# Patient Record
Sex: Male | Born: 1950 | Race: White | Hispanic: No | Smoking: Current every day smoker
Health system: Southern US, Community
[De-identification: ages and names within clinical notes are randomized; demographics above are authoritative.]

## PROBLEM LIST (undated history)

## (undated) DIAGNOSIS — D649 Anemia, unspecified: Secondary | ICD-10-CM

## (undated) DIAGNOSIS — Z720 Tobacco use: Secondary | ICD-10-CM

## (undated) DIAGNOSIS — R569 Unspecified convulsions: Secondary | ICD-10-CM

## (undated) DIAGNOSIS — J42 Unspecified chronic bronchitis: Secondary | ICD-10-CM

## (undated) DIAGNOSIS — F1020 Alcohol dependence, uncomplicated: Secondary | ICD-10-CM

## (undated) DIAGNOSIS — J449 Chronic obstructive pulmonary disease, unspecified: Secondary | ICD-10-CM

## (undated) DIAGNOSIS — E559 Vitamin D deficiency, unspecified: Secondary | ICD-10-CM

## (undated) DIAGNOSIS — E291 Testicular hypofunction: Secondary | ICD-10-CM

## (undated) DIAGNOSIS — M1990 Unspecified osteoarthritis, unspecified site: Secondary | ICD-10-CM

## (undated) DIAGNOSIS — G40909 Epilepsy, unspecified, not intractable, without status epilepticus: Secondary | ICD-10-CM

## (undated) DIAGNOSIS — K7460 Unspecified cirrhosis of liver: Secondary | ICD-10-CM

## (undated) DIAGNOSIS — F1210 Cannabis abuse, uncomplicated: Secondary | ICD-10-CM

## (undated) DIAGNOSIS — F419 Anxiety disorder, unspecified: Secondary | ICD-10-CM

## (undated) DIAGNOSIS — I2089 Other forms of angina pectoris: Secondary | ICD-10-CM

## (undated) DIAGNOSIS — B182 Chronic viral hepatitis C: Secondary | ICD-10-CM

## (undated) DIAGNOSIS — I509 Heart failure, unspecified: Secondary | ICD-10-CM

## (undated) DIAGNOSIS — J189 Pneumonia, unspecified organism: Secondary | ICD-10-CM

## (undated) DIAGNOSIS — Z8719 Personal history of other diseases of the digestive system: Secondary | ICD-10-CM

## (undated) DIAGNOSIS — I208 Other forms of angina pectoris: Secondary | ICD-10-CM

## (undated) HISTORY — DX: Tobacco use: Z72.0

## (undated) HISTORY — DX: Pneumonia, unspecified organism: J18.9

## (undated) HISTORY — PX: ESOPHAGOGASTRODUODENOSCOPY: SHX1529

## (undated) HISTORY — PX: LIVER CYST REMOVAL: SHX5951

## (undated) HISTORY — DX: Heart failure, unspecified: I50.9

## (undated) HISTORY — DX: Epilepsy, unspecified, not intractable, without status epilepticus: G40.909

## (undated) HISTORY — PX: FRACTURE SURGERY: SHX138

---

## 1993-09-27 DIAGNOSIS — J189 Pneumonia, unspecified organism: Secondary | ICD-10-CM

## 1993-09-27 HISTORY — PX: LUNG SURGERY: SHX703

## 1993-09-27 HISTORY — DX: Pneumonia, unspecified organism: J18.9

## 2009-07-05 ENCOUNTER — Inpatient Hospital Stay (HOSPITAL_COMMUNITY): Admission: EM | Admit: 2009-07-05 | Discharge: 2009-07-06 | Payer: Self-pay | Admitting: Emergency Medicine

## 2009-07-05 ENCOUNTER — Ambulatory Visit: Payer: Self-pay | Admitting: Infectious Diseases

## 2009-07-05 ENCOUNTER — Ambulatory Visit: Payer: Self-pay | Admitting: Internal Medicine

## 2010-12-31 LAB — URINALYSIS, ROUTINE W REFLEX MICROSCOPIC
Bilirubin Urine: NEGATIVE
Specific Gravity, Urine: 1.015 (ref 1.005–1.030)
pH: 6.5 (ref 5.0–8.0)

## 2010-12-31 LAB — HEMOGLOBIN A1C
Hgb A1c MFr Bld: 5.2 % (ref 4.6–6.1)
Mean Plasma Glucose: 103 mg/dL

## 2010-12-31 LAB — RAPID URINE DRUG SCREEN, HOSP PERFORMED
Amphetamines: NOT DETECTED
Barbiturates: POSITIVE — AB
Cocaine: NOT DETECTED
Cocaine: NOT DETECTED
Opiates: POSITIVE — AB
Opiates: POSITIVE — AB
Tetrahydrocannabinol: POSITIVE — AB

## 2010-12-31 LAB — LIPID PANEL: VLDL: 30 mg/dL (ref 0–40)

## 2010-12-31 LAB — COMPREHENSIVE METABOLIC PANEL
AST: 86 U/L — ABNORMAL HIGH (ref 0–37)
Chloride: 104 mEq/L (ref 96–112)
Chloride: 108 mEq/L (ref 96–112)
Creatinine, Ser: 0.85 mg/dL (ref 0.4–1.5)
Glucose, Bld: 101 mg/dL — ABNORMAL HIGH (ref 70–99)
Glucose, Bld: 158 mg/dL — ABNORMAL HIGH (ref 70–99)
Potassium: 3.5 mEq/L (ref 3.5–5.1)
Sodium: 136 mEq/L (ref 135–145)
Total Protein: 6.6 g/dL (ref 6.0–8.3)

## 2010-12-31 LAB — CBC
HCT: 42.4 % (ref 39.0–52.0)
Hemoglobin: 14.8 g/dL (ref 13.0–17.0)
Hemoglobin: 17.4 g/dL — ABNORMAL HIGH (ref 13.0–17.0)
MCHC: 34.9 g/dL (ref 30.0–36.0)
MCV: 97.6 fL (ref 78.0–100.0)
Platelets: 121 10*3/uL — ABNORMAL LOW (ref 150–400)
Platelets: 142 10*3/uL — ABNORMAL LOW (ref 150–400)
RBC: 4.34 MIL/uL (ref 4.22–5.81)
RDW: 13.1 % (ref 11.5–15.5)
RDW: 13.1 % (ref 11.5–15.5)
WBC: 7.1 10*3/uL (ref 4.0–10.5)

## 2010-12-31 LAB — DIFFERENTIAL
Eosinophils Relative: 2 % (ref 0–5)
Monocytes Relative: 5 % (ref 3–12)
Neutro Abs: 3.4 10*3/uL (ref 1.7–7.7)
Neutrophils Relative %: 48 % (ref 43–77)

## 2010-12-31 LAB — HEPATITIS PANEL, ACUTE
HCV Ab: REACTIVE — AB
Hep B C IgM: NEGATIVE
Hepatitis B Surface Ag: NEGATIVE

## 2010-12-31 LAB — CARDIAC PANEL(CRET KIN+CKTOT+MB+TROPI)
Relative Index: INVALID (ref 0.0–2.5)
Troponin I: 0.02 ng/mL (ref 0.00–0.06)

## 2010-12-31 LAB — HIV ANTIBODY (ROUTINE TESTING W REFLEX)
HIV: NONREACTIVE
HIV: NONREACTIVE

## 2010-12-31 LAB — CK TOTAL AND CKMB (NOT AT ARMC)
Relative Index: 2.1 (ref 0.0–2.5)
Total CK: 101 U/L (ref 7–232)

## 2011-10-21 ENCOUNTER — Emergency Department: Payer: Self-pay | Admitting: Unknown Physician Specialty

## 2011-10-21 LAB — CBC
HGB: 17.5 g/dL (ref 13.0–18.0)
MCHC: 34.2 g/dL (ref 32.0–36.0)
RBC: 5.13 10*6/uL (ref 4.40–5.90)
WBC: 12.1 10*3/uL — ABNORMAL HIGH (ref 3.8–10.6)

## 2011-10-21 LAB — PROTIME-INR
INR: 1.1
Prothrombin Time: 15 secs — ABNORMAL HIGH (ref 11.5–14.7)

## 2011-10-21 LAB — COMPREHENSIVE METABOLIC PANEL
Albumin: 3.5 g/dL (ref 3.4–5.0)
Anion Gap: 14 (ref 7–16)
Calcium, Total: 8.7 mg/dL (ref 8.5–10.1)
Co2: 22 mmol/L (ref 21–32)
EGFR (Non-African Amer.): >60
Glucose: 103 mg/dL — ABNORMAL HIGH (ref 65–99)
Osmolality: 279 (ref 275–301)
Potassium: 4 mmol/L (ref 3.5–5.1)
SGOT(AST): 206 U/L — ABNORMAL HIGH (ref 15–37)
Sodium: 140 mmol/L (ref 136–145)

## 2011-10-21 LAB — TROPONIN I: Troponin-I: <0.02 ng/mL

## 2011-10-21 LAB — APTT: Activated PTT: 24.8 secs (ref 23.6–35.9)

## 2013-01-11 ENCOUNTER — Telehealth: Payer: Self-pay | Admitting: *Deleted

## 2013-01-11 ENCOUNTER — Emergency Department: Payer: Self-pay | Admitting: Unknown Physician Specialty

## 2013-01-11 LAB — BASIC METABOLIC PANEL
Anion Gap: 5 — ABNORMAL LOW (ref 7–16)
BUN: 12 mg/dL (ref 7–18)
Chloride: 104 mmol/L (ref 98–107)
Co2: 26 mmol/L (ref 21–32)
Creatinine: 1.04 mg/dL (ref 0.60–1.30)
EGFR (African American): >60
EGFR (Non-African Amer.): >60
Potassium: 3.6 mmol/L (ref 3.5–5.1)

## 2013-01-11 LAB — CBC
MCH: 34.1 pg — ABNORMAL HIGH (ref 26.0–34.0)
MCHC: 35.4 g/dL (ref 32.0–36.0)
MCV: 96 fL (ref 80–100)
RBC: 4.65 10*6/uL (ref 4.40–5.90)
WBC: 4.2 10*3/uL (ref 3.8–10.6)

## 2013-01-11 LAB — TROPONIN I: Troponin-I: <0.02 ng/mL

## 2013-01-11 LAB — CK TOTAL AND CKMB (NOT AT ARMC)
CK, Total: 215 U/L (ref 35–232)
CK-MB: 2.3 ng/mL (ref 0.5–3.6)

## 2013-01-11 NOTE — Telephone Encounter (Signed)
Per Dr. Mariah Milling patient needs follow-up appointment from the ER for chest pain.  Patient scheduled on 01/16/13 at 3:15pm with Dr. Kirke Corin.  Called patient and left voicemail at 3:20pm

## 2013-01-16 ENCOUNTER — Encounter: Payer: Self-pay | Admitting: Cardiovascular Disease

## 2013-01-16 ENCOUNTER — Ambulatory Visit (INDEPENDENT_AMBULATORY_CARE_PROVIDER_SITE_OTHER): Payer: Medicaid Other | Admitting: Cardiovascular Disease

## 2013-01-16 VITALS — BP 132/80 | HR 91 | Ht 69.0 in | Wt 225.8 lb

## 2013-01-16 DIAGNOSIS — F17200 Nicotine dependence, unspecified, uncomplicated: Secondary | ICD-10-CM

## 2013-01-16 DIAGNOSIS — R079 Chest pain, unspecified: Secondary | ICD-10-CM | POA: Insufficient documentation

## 2013-01-16 DIAGNOSIS — Z720 Tobacco use: Secondary | ICD-10-CM | POA: Insufficient documentation

## 2013-01-16 NOTE — Progress Notes (Signed)
HPI  This is a 62 year old male who was referred from emergency room at [facility] for evaluation of chest pain. He has no previous cardiac history. He has no history of epilepsy and has been on disability because of this. He has prolonged history of smoking. No family history of premature coronary artery disease. He has no history of hypertension or diabetes. Last week while he was sitting in the recliner, he had a sudden onset of substernal chest pain described as aching. It was associated with palpitations and dyspnea. It lasted for about 30 minutes and he thought he was having a heart attack. He went to the emergency room at [facility]. ECG  did not show any acute changes. His labs were unremarkable with negative cardiac enzymes. Chest x-ray showed no acute cardiopulmonary disease. He had another episode the day after but no chest pain since then. He reports having chest pain last year around the same time and he thinks it might be related to pollens. He has no history of allergies though. He reports cramping in both calves at night but not with physical activities  Allergies  Allergen Reactions  . Codeine     Vomiting, chills, "deathly ill"     No current outpatient prescriptions on file prior to visit.   No current facility-administered medications on file prior to visit.     Past Medical History  Diagnosis Date  . Epilepsy   . Pneumonia   . Tobacco abuse      Past Surgical History  Procedure Laterality Date  . Lung surgery  1995    [place], "walking pna"  . Liver cyst removal       History reviewed. No pertinent family history.   History   Social History  . Marital Status: Single    Spouse Name: N/A    Number of Children: N/A  . Years of Education: N/A   Occupational History  . Not on file.   Social History Main Topics  . Smoking status: Current Every Day Smoker -- 2.00 packs/day for 41 years  . Smokeless tobacco: Not on file  . Alcohol Use: Not on file  . Drug  Use: Not on file  . Sexually Active: Not on file   Other Topics Concern  . Not on file   Social History Narrative  . No narrative on file     ROS Constitutional: Negative for fever, chills, diaphoresis, activity change, appetite change and fatigue.  HENT: Negative for hearing loss, nosebleeds, congestion, sore throat, facial swelling, drooling, trouble swallowing, neck pain, voice change, sinus pressure and tinnitus.  Eyes: Negative for photophobia, pain, discharge and visual disturbance.  Respiratory: Negative for apnea, cough and wheezing.  Cardiovascular: Negative for  palpitations and leg swelling.  Gastrointestinal: Negative for nausea, vomiting, abdominal pain, diarrhea, constipation, blood in stool and abdominal distention.  Genitourinary: Negative for dysuria, urgency, frequency, hematuria and decreased urine volume.  Musculoskeletal: Negative for myalgias, back pain, joint swelling, arthralgias and gait problem.  Skin: Negative for color change, pallor, rash and wound.  Neurological: Negative for dizziness, tremors, seizures, syncope, speech difficulty, weakness, light-headedness, numbness and headaches.  Psychiatric/Behavioral: Negative for suicidal ideas, hallucinations, behavioral problems and agitation. The patient is not nervous/anxious.     PHYSICAL EXAM   BP 132/80  Pulse 91  Ht 5\' 9"  (1.753 m)  Wt 225 lb 12 oz (102.4 kg)  BMI 33.32 kg/m2 Constitutional: He is oriented to person, place, and time. He appears well-developed and well-nourished. No  distress.  HENT: No nasal discharge.  Head: Normocephalic and atraumatic.  Eyes: Pupils are equal and round. Right eye exhibits no discharge. Left eye exhibits no discharge.  Neck: Normal range of motion. Neck supple. No JVD present. No thyromegaly present.  Cardiovascular: Normal rate, regular rhythm, normal heart sounds and. Exam reveals no gallop and no friction rub. No murmur heard.  Pulmonary/Chest: Effort normal  and breath sounds normal. No stridor. No respiratory distress. He has no wheezes. He has no rales. He exhibits no tenderness.  Abdominal: Soft. Bowel sounds are normal. He exhibits no distension. There is no tenderness. There is no rebound and no guarding.  Musculoskeletal: Normal range of motion. He exhibits no edema and no tenderness.  Neurological: He is alert and oriented to person, place, and time. Coordination normal.  Skin: Skin is warm and dry. No rash noted. He is not diaphoretic. No erythema. No pallor.  Psychiatric: He has a normal mood and affect. His behavior is normal. Judgment and thought content normal.  Distal pulses are normal     EKG: Normal sinus rhythm no significant ST changes.   ASSESSMENT AND PLAN

## 2013-01-16 NOTE — Assessment & Plan Note (Signed)
The patient had 2 episodes of chest pain last week but since then has been feeling better. Baseline EKG does not show any acute changes and physical exam is unremarkable. This was associated with dyspnea or palpitations. I recommend further evaluation with a stress echocardiogram.

## 2013-01-16 NOTE — Assessment & Plan Note (Signed)
I discussed with the patient the importance of smoking cessation and healthy lifestyle in order to decrease his chances of future cardiovascular events.

## 2013-01-16 NOTE — Patient Instructions (Addendum)
Your physician has requested that you have a stress echocardiogram. For further information please visit [email or web address] Please follow instruction sheet as given.  Follow up as needed.

## 2013-02-07 ENCOUNTER — Other Ambulatory Visit: Payer: Self-pay

## 2013-02-07 DIAGNOSIS — R079 Chest pain, unspecified: Secondary | ICD-10-CM

## 2013-02-07 NOTE — Addendum Note (Signed)
Addended by: Basma Buchner E on: 02/07/2013 10:09 AM   Modules accepted: Orders

## 2013-02-15 ENCOUNTER — Encounter: Payer: Self-pay | Admitting: Cardiovascular Disease

## 2013-02-15 ENCOUNTER — Ambulatory Visit (INDEPENDENT_AMBULATORY_CARE_PROVIDER_SITE_OTHER): Payer: Medicaid Other | Admitting: Cardiovascular Disease

## 2013-02-15 ENCOUNTER — Other Ambulatory Visit (INDEPENDENT_AMBULATORY_CARE_PROVIDER_SITE_OTHER): Payer: Medicaid Other

## 2013-02-15 VITALS — Ht 69.0 in | Wt 225.0 lb

## 2013-02-15 DIAGNOSIS — R079 Chest pain, unspecified: Secondary | ICD-10-CM

## 2013-02-15 NOTE — Progress Notes (Signed)
Exercise Treadmill Test   Treadmill ordered for recent epsiodes of chest pain.  Resting EKG shows NSR with rate of 80 bpm, with no significant ST or T wave changes Resting blood pressure of 124/82. Stand bruce protocal was used.  Patient exercised for 6 min 24 sec,  Peak heart rate of 126 bpm.  This was 78% of the maximum predicted heart rate (target heart rate 136). No symptoms of chest pain or lightheadedness were reported at peak stress or in recovery.  Legs gave out and was unable to reach target heart rate. Peak Blood pressure recorded was 160/96. Heart rate at 3 minutes in recovery was 93 bpm. No significant ST changes at peak stress or in recovery concerning for ischemia  FINAL IMPRESSION: Suboptimal  exercise stress echo test. No significant EKG changes concerning for ischemia. Adequate exercise tolerance. Unable to reach target heart rate.  Stress ECHO images did not show any significant wall motion abnormality concerning for ischemia. Clinical correlation recommended.  If indicated, consider alternate testing modality such as pharmacologic Myoview.

## 2013-02-16 ENCOUNTER — Telehealth: Payer: Self-pay

## 2013-02-16 NOTE — Telephone Encounter (Signed)
78% is ok with me. I don't see the results though.

## 2013-02-16 NOTE — Telephone Encounter (Signed)
Dr. Mariah Milling wanted to let us know he read Stress echo done on pt yesterday and pt only reached 78% of target HR. Asks if Dr. Kirke Corin wanted to order a different test

## 2013-02-16 NOTE — Telephone Encounter (Signed)
look under 5/22 encounter note

## 2013-02-16 NOTE — Telephone Encounter (Signed)
Inform patient that his stress test was normal but we did not reach target heart rate. If he is still having chest pain, I suggest a Lexiscan myoview.

## 2013-02-20 NOTE — Telephone Encounter (Signed)
Pt informed He verb understanding but denies further CP, attributes the one episode to the pollen He will call us back should the pain return

## 2013-03-15 ENCOUNTER — Encounter: Payer: Self-pay | Admitting: Cardiovascular Disease

## 2013-03-22 NOTE — Patient Instructions (Addendum)
Normal stress echo today No further testing needed Please call us if you have any further questions or symptoms

## 2013-10-19 ENCOUNTER — Ambulatory Visit: Payer: Self-pay | Admitting: Gastroenterology

## 2016-04-06 ENCOUNTER — Emergency Department
Admission: EM | Admit: 2016-04-06 | Discharge: 2016-04-06 | Disposition: A | Discharge status: Home / Self Care | Payer: Medicaid Other | Attending: Emergency Medicine | Admitting: Emergency Medicine

## 2016-04-06 ENCOUNTER — Encounter: Payer: Self-pay | Admitting: Emergency Medicine

## 2016-04-06 DIAGNOSIS — F17200 Nicotine dependence, unspecified, uncomplicated: Secondary | ICD-10-CM | POA: Diagnosis not present

## 2016-04-06 DIAGNOSIS — Z760 Encounter for issue of repeat prescription: Secondary | ICD-10-CM | POA: Diagnosis present

## 2016-04-06 DIAGNOSIS — Z7982 Long term (current) use of aspirin: Secondary | ICD-10-CM | POA: Insufficient documentation

## 2016-04-06 DIAGNOSIS — Z8669 Personal history of other diseases of the nervous system and sense organs: Secondary | ICD-10-CM | POA: Insufficient documentation

## 2016-04-06 MED ORDER — PHENOBARBITAL 97.2 MG PO TABS: 97.2000 mg | ORAL_TABLET | Freq: Every day | ORAL | Status: DC

## 2016-04-06 MED ORDER — ALPRAZOLAM 0.5 MG PO TABS: 0.5000 mg | ORAL_TABLET | Freq: Two times a day (BID) | ORAL | Status: AC | PRN

## 2016-04-06 NOTE — ED Notes (Signed)
Patient states he is out of his seizure medication, does not see new PCP in enough time before he runs out.

## 2016-04-06 NOTE — ED Provider Notes (Signed)
[facility] Emergency Department Provider Note   ____________________________________________  Time seen: Approximately 12:14 PM  I have reviewed the triage vital signs and the nursing notes.   HISTORY  Chief Complaint Medication Refill    HPI Glen Colon is a 65 y.o. male patient requests refill of  seizure and anxiety medications. She stated previous PCP has died his new PCP will not see him until mid August. Patient stated he had a seizure activity secondary to being out of medications last night.   Past Medical History  Diagnosis Date  . Epilepsy ([place])   . Pneumonia   . Tobacco abuse     Patient Active Problem List   Diagnosis Date Noted  . Chest pain 01/16/2013  . Tobacco abuse     Past Surgical History  Procedure Laterality Date  . Lung surgery  [place], "walking pna"  . Liver cyst removal      Current Outpatient Rx  Name  Route  Sig  Dispense  Refill  . ALPRAZolam (XANAX) 0.5 MG tablet   Oral   Take 1 tablet (0.5 mg total) by mouth 2 (two) times daily as needed for anxiety or sleep.   30 tablet   0   . alprazolam (XANAX) 2 MG tablet   Oral   Take 1 mg by mouth 2 (two) times daily.         Marland Kitchen aspirin 81 MG tablet   Oral   Take 81 mg by mouth as needed for pain.         Marland Kitchen PHENObarbital (LUMINAL) 100 MG tablet   Oral   Take 100 mg by mouth daily.         Marland Kitchen PHENobarbital (LUMINAL) 97.2 MG tablet   Oral   Take 1 tablet (97.2 mg total) by mouth at bedtime.   30 tablet   0     Allergies Codeine  No family history on file.  Social History Social History  Substance Use Topics  . Smoking status: Current Every Day Smoker -- 2.00 packs/day for 41 years  . Smokeless tobacco: None  . Alcohol Use: None    Review of Systems Constitutional: No fever/chills Eyes: No visual changes. ENT: No sore throat. Cardiovascular: Denies chest pain. Respiratory: Denies shortness of breath. Gastrointestinal:  No abdominal pain.  No nausea, no vomiting.  No diarrhea.  No constipation. Genitourinary: Negative for dysuria. Musculoskeletal: Negative for back pain. Skin: Negative for rash. Neurological: Negative for headaches, focal weakness or numbness.Seizures Allergic/Immunilogical: Codeine  ____________________________________________   PHYSICAL EXAM:  VITAL SIGNS: ED Triage Vitals  Enc Vitals Group     BP 04/06/16 1157 139/73 mmHg     Pulse Rate 04/06/16 1157 80     Resp 04/06/16 1157 18     Temp 04/06/16 1157 97.6 F (36.4 C)     Temp Source 04/06/16 1157 Oral     SpO2 04/06/16 1157 99 %     Weight --      Height --      Head Cir --      Peak Flow --      Pain Score --      Pain Loc --      Pain Edu? --      Excl. in [place]? --     Constitutional: Alert and oriented. Well appearing and in no acute distress. Eyes: Conjunctivae are normal. PERRL. EOMI. Head: Atraumatic. Nose: No congestion/rhinnorhea. Mouth/Throat: Mucous membranes are  moist.  Oropharynx non-erythematous. Neck: No stridor.  No cervical spine tenderness to palpation. Hematological/Lymphatic/Immunilogical: No cervical lymphadenopathy. Cardiovascular: Normal rate, regular rhythm. Grossly normal heart sounds.  Good peripheral circulation. Respiratory: Normal respiratory effort.  No retractions. Lungs CTAB. Gastrointestinal: Soft and nontender. No distention. No abdominal bruits. No CVA tenderness. Musculoskeletal: No lower extremity tenderness nor edema.  No joint effusions. Neurologic:  Normal speech and language. No gross focal neurologic deficits are appreciated. No gait instability. Skin:  Skin is warm, dry and intact. No rash noted. Psychiatric: Mood and affect are normal. Speech and behavior are normal.  ____________________________________________   LABS (all labs ordered are listed, but only abnormal results are displayed)  Labs Reviewed - No data to  display ____________________________________________  EKG   ____________________________________________  RADIOLOGY   ____________________________________________   PROCEDURES  Procedure(s) performed: None  Procedures  Critical Care performed: No  ____________________________________________   INITIAL IMPRESSION / ASSESSMENT AND PLAN / ED COURSE  Pertinent labs & imaging results that were available during my care of the patient were reviewed by me and considered in my medical decision making (see chart for details).  Medication refill for seizure. Patient phenobarbital and Xanax was refilled. Patient advised to follow new PCP per scheduled for continual care. ____________________________________________   FINAL CLINICAL IMPRESSION(S) / ED DIAGNOSES  Final diagnoses:  Medication refill      NEW MEDICATIONS STARTED DURING THIS VISIT:  New Prescriptions   ALPRAZOLAM (XANAX) 0.5 MG TABLET    Take 1 tablet (0.5 mg total) by mouth 2 (two) times daily as needed for anxiety or sleep.   PHENOBARBITAL (LUMINAL) 97.2 MG TABLET    Take 1 tablet (97.2 mg total) by mouth at bedtime.     Note:  This document was prepared using Dragon voice recognition software and may include unintentional dictation errors.    Sable Feil, PA-C 04/06/16 [place], MD 04/06/16 [phone]

## 2016-04-06 NOTE — ED Notes (Signed)
Pt states he needs a refill on his seizure meds has been out for three days and had a seizure last night. Pt does not want to be seen for seizure, just needs a med refill.

## 2016-04-06 NOTE — Discharge Instructions (Signed)
Medicine Refill at the Emergency Department  We have refilled your medicine today, but it is best for you to get refills through your primary health care provider's office. In the future, please plan ahead so you do not need to get refills from the emergency department.  If the medicine we refilled was a maintenance medicine, you may have received only enough to get you by until you are able to see your regular health care provider.     This information is not intended to replace advice given to you by your health care provider. Make sure you discuss any questions you have with your health care provider.     Document Released: 12/31/2003 Document Revised: 10/04/2014 Document Reviewed: 12/21/2013  Elsevier Interactive Patient Education 2016 [employer]

## 2016-05-13 ENCOUNTER — Emergency Department
Admission: EM | Admit: 2016-05-13 | Discharge: 2016-05-13 | Disposition: A | Discharge status: Home / Self Care | Payer: Medicaid Other | Attending: Emergency Medicine | Admitting: Emergency Medicine

## 2016-05-13 ENCOUNTER — Encounter: Payer: Self-pay | Admitting: Emergency Medicine

## 2016-05-13 DIAGNOSIS — Z7982 Long term (current) use of aspirin: Secondary | ICD-10-CM | POA: Diagnosis not present

## 2016-05-13 DIAGNOSIS — F17200 Nicotine dependence, unspecified, uncomplicated: Secondary | ICD-10-CM | POA: Insufficient documentation

## 2016-05-13 DIAGNOSIS — H9202 Otalgia, left ear: Secondary | ICD-10-CM | POA: Diagnosis present

## 2016-05-13 MED ORDER — TRAMADOL HCL 50 MG PO TABS: 50.0000 mg | ORAL_TABLET | Freq: Four times a day (QID) | ORAL | 0 refills | Status: DC | PRN

## 2016-05-13 MED ORDER — AMOXICILLIN 500 MG PO CAPS
500.0000 mg | ORAL_CAPSULE | Freq: Three times a day (TID) | ORAL | 0 refills | Status: DC
Start: 2016-05-13 — End: 2016-11-09

## 2016-05-13 NOTE — ED Triage Notes (Signed)
Left ear pain for about 1 week  Was seen by PCP on Monday and received some ear drops  W/o relief

## 2016-05-13 NOTE — ED Provider Notes (Signed)
[facility] Emergency Department Provider Note   ____________________________________________    I have reviewed the triage vital signs and the nursing notes.   HISTORY  Chief Complaint Otalgia     HPI Glen Colon is a 65 y.o. male who presents with left-sided otalgia. He has been treated by his PCP with ear drops which he reports helped temporarily but now the pain has worsened again.No fevers or chills reported. No other URI symptoms. He reports the pain is moderate to severe and keeps him  awake at night.   Past Medical History:  Diagnosis Date  . Epilepsy ([place])   . Pneumonia   . Tobacco abuse     Patient Active Problem List   Diagnosis Date Noted  . Chest pain 01/16/2013  . Tobacco abuse     Past Surgical History:  Procedure Laterality Date  . LIVER CYST REMOVAL    . [place], "walking pna"    Prior to Admission medications   Medication Sig Start Date End Date Taking? Authorizing Provider  ALPRAZolam Duanne Moron) 0.5 MG tablet Take 1 tablet (0.5 mg total) by mouth 2 (two) times daily as needed for anxiety or sleep. 04/06/16 04/06/17  Sable Feil, PA-C  alprazolam Duanne Moron) 2 MG tablet Take 1 mg by mouth 2 (two) times daily.    Historical Provider, MD  amoxicillin (AMOXIL) 500 MG capsule Take 1 capsule (500 mg total) by mouth 3 (three) times daily. 05/13/16   Lavonia Drafts, MD  aspirin 81 MG tablet Take 81 mg by mouth as needed for pain.    Historical Provider, MD  PHENObarbital (LUMINAL) 100 MG tablet Take 100 mg by mouth daily.    Historical Provider, MD  PHENobarbital (LUMINAL) 97.2 MG tablet Take 1 tablet (97.2 mg total) by mouth at bedtime. 04/06/16   Sable Feil, PA-C  traMADol (ULTRAM) 50 MG tablet Take 1 tablet (50 mg total) by mouth every 6 (six) hours as needed. 05/13/16 05/13/17  Lavonia Drafts, MD     Allergies Codeine  No family history on file.  Social History Social History  Substance Use  Topics  . Smoking status: Current Every Day Smoker    Packs/day: 2.00    Years: 41.00  . Smokeless tobacco: Never Used  . Alcohol use Yes    Review of Systems  Constitutional: No fever/chills  ENT: No sore throat.   Gastrointestinal:  No nausea, no vomiting.    Skin: Negative for rash. Neurological: Negative for headaches     ____________________________________________   PHYSICAL EXAM:  VITAL SIGNS: ED Triage Vitals  Enc Vitals Group     BP 05/13/16 1514 (!) 160/97     Pulse Rate 05/13/16 1514 86     Resp 05/13/16 1514 20     Temp 05/13/16 1514 98.7 F (37.1 C)     Temp Source 05/13/16 1514 Oral     SpO2 05/13/16 1514 97 %     Weight 05/13/16 1515 220 lb (99.8 kg)     Height 05/13/16 1515 5\' 10"  (1.778 m)     Head Circumference --      Peak Flow --      Pain Score 05/13/16 1515 9     Pain Loc --      Pain Edu? --      Excl. in [place]? --     Constitutional: Alert and oriented. No acute distress. Pleasant and interactive Eyes: Conjunctivae are normal.  Head: Atraumatic. Nose: No congestion/rhinnorhea. Ears/Mouth/Throat: Mucous membranes are moist. Left ear with erythema of the external acoustic meatus but also with significant bulging of the left eardrum and whitish discoloration Cardiovascular: Normal rate, regular rhythm.  Respiratory: Normal respiratory effort.  No retractions. Genitourinary: deferred Musculoskeletal: No lower extremity tenderness nor edema.   Neurologic:  Normal speech and language. No gross focal neurologic deficits are appreciated.   Skin:  Skin is warm, dry and intact. No rash noted.   ____________________________________________   LABS (all labs ordered are listed, but only abnormal results are displayed)  Labs Reviewed - No data to  display ____________________________________________  EKG   ____________________________________________  RADIOLOGY  None ____________________________________________   PROCEDURES  Procedure(s) performed: No    Critical Care performed: No ____________________________________________   INITIAL IMPRESSION / ASSESSMENT AND PLAN / ED COURSE  Pertinent labs & imaging results that were available during my care of the patient were reviewed by me and considered in my medical decision making (see chart for details).  Exam appears consistent with otitis externa but given his lack of response we will start him on by mouth antibiotics and have him follow-up closely with his PCP.   ____________________________________________   FINAL CLINICAL IMPRESSION(S) / ED DIAGNOSES  Final diagnoses:  Otalgia of left ear      NEW MEDICATIONS STARTED DURING THIS VISIT:  Discharge Medication List as of 05/13/2016  3:30 PM    START taking these medications   Details  amoxicillin (AMOXIL) 500 MG capsule Take 1 capsule (500 mg total) by mouth 3 (three) times daily., Starting Thu 05/13/2016, Print    traMADol (ULTRAM) 50 MG tablet Take 1 tablet (50 mg total) by mouth every 6 (six) hours as needed., Starting Thu 05/13/2016, Until Fri 05/13/2017, Print         Note:  This document was prepared using Dragon voice recognition software and may include unintentional dictation errors.    Lavonia Drafts, MD 05/13/16 [phone]

## 2016-09-27 DIAGNOSIS — R569 Unspecified convulsions: Secondary | ICD-10-CM

## 2016-09-27 DIAGNOSIS — G40909 Epilepsy, unspecified, not intractable, without status epilepticus: Secondary | ICD-10-CM

## 2016-09-27 HISTORY — DX: Epilepsy, unspecified, not intractable, without status epilepticus: G40.909

## 2016-09-27 HISTORY — DX: Unspecified convulsions: R56.9

## 2016-10-28 ENCOUNTER — Other Ambulatory Visit: Payer: Self-pay | Admitting: Nurse Practitioner

## 2016-10-28 DIAGNOSIS — Z77018 Contact with and (suspected) exposure to other hazardous metals: Secondary | ICD-10-CM

## 2016-10-28 DIAGNOSIS — K7689 Other specified diseases of liver: Secondary | ICD-10-CM

## 2016-10-28 DIAGNOSIS — K7469 Other cirrhosis of liver: Secondary | ICD-10-CM

## 2016-11-08 ENCOUNTER — Ambulatory Visit
Admission: RE | Admit: 2016-11-08 | Discharge: 2016-11-08 | Disposition: A | Discharge status: Home / Self Care | Payer: Medicaid Other | Source: Ambulatory Visit | Attending: Nurse Practitioner | Admitting: Nurse Practitioner

## 2016-11-08 DIAGNOSIS — Z77018 Contact with and (suspected) exposure to other hazardous metals: Secondary | ICD-10-CM

## 2016-11-08 DIAGNOSIS — K7689 Other specified diseases of liver: Secondary | ICD-10-CM

## 2016-11-08 DIAGNOSIS — K7469 Other cirrhosis of liver: Secondary | ICD-10-CM

## 2016-11-08 MED ORDER — GADOXETATE DISODIUM 0.25 MMOL/ML IV SOLN
9.0000 mL | Freq: Once | INTRAVENOUS | Status: AC | PRN
  Administered 2016-11-08: 9 mL via INTRAVENOUS

## 2016-11-08 MED ORDER — GADOBENATE DIMEGLUMINE 529 MG/ML IV SOLN: 9.0000 mL | Freq: Once | INTRAVENOUS | Status: DC | PRN

## 2016-11-09 ENCOUNTER — Emergency Department
Admission: EM | Admit: 2016-11-09 | Discharge: 2016-11-09 | Disposition: A | Discharge status: Home / Self Care | Payer: Medicaid Other | Attending: Emergency Medicine | Admitting: Emergency Medicine

## 2016-11-09 ENCOUNTER — Encounter: Payer: Self-pay | Admitting: Emergency Medicine

## 2016-11-09 DIAGNOSIS — H6992 Unspecified Eustachian tube disorder, left ear: Secondary | ICD-10-CM | POA: Diagnosis not present

## 2016-11-09 DIAGNOSIS — H6122 Impacted cerumen, left ear: Secondary | ICD-10-CM | POA: Diagnosis not present

## 2016-11-09 DIAGNOSIS — Z7982 Long term (current) use of aspirin: Secondary | ICD-10-CM | POA: Insufficient documentation

## 2016-11-09 DIAGNOSIS — F17210 Nicotine dependence, cigarettes, uncomplicated: Secondary | ICD-10-CM | POA: Insufficient documentation

## 2016-11-09 DIAGNOSIS — H9202 Otalgia, left ear: Secondary | ICD-10-CM

## 2016-11-09 DIAGNOSIS — H6982 Other specified disorders of Eustachian tube, left ear: Secondary | ICD-10-CM

## 2016-11-09 MED ORDER — AMOXICILLIN 500 MG PO TABS: 500.0000 mg | ORAL_TABLET | Freq: Three times a day (TID) | ORAL | 0 refills | Status: DC

## 2016-11-09 MED ORDER — FLUTICASONE PROPIONATE 50 MCG/ACT NA SUSP: 2.0000 | Freq: Every day | NASAL | 0 refills | Status: DC

## 2016-11-09 NOTE — ED Triage Notes (Signed)
Left ear pain x 3-4 days.  Patient states he had similar symptoms last year and was treated with antibiotics for ear infection.

## 2016-11-09 NOTE — ED Notes (Signed)
See triage note   Left ear pain for about 3-4 days  Afebrile   No drainage

## 2016-11-09 NOTE — ED Provider Notes (Signed)
[facility] Emergency Department Provider Note  ____________________________________________  Time seen: Approximately 2:37 PM  I have reviewed the triage vital signs and the nursing notes.   HISTORY  Chief Complaint Otalgia    HPI Glen Colon is a 66 y.o. male , NAD, presents to the emergency department for evaluation of left ear pain. States he had onset of left ear pain approximately 4 days ago. Felt he might have had a fever but did not check his temperature. Denies any nasal congestion, runny nose, sore throat, sinus pressure. Has noted that he feels lightheaded when he goes from sitting to standing or from laying to sitting. Can also had some lightheadedness when he moves his head too fast. Denies any drainage from the ears. Has been using sweet oil in the left ear in which he notes dark discharge on his pillow when he wakes in the morning. Denies injury or trauma to the face or neck. Has had no changes in hearing. States he had a similar episode some months ago was placed on antibiotics which alleviated the symptoms.   Past Medical History:  Diagnosis Date  . Epilepsy ([place])   . Pneumonia   . Tobacco abuse     Patient Active Problem List   Diagnosis Date Noted  . Chest pain 01/16/2013  . Tobacco abuse     Past Surgical History:  Procedure Laterality Date  . LIVER CYST REMOVAL    . [place], "walking pna"    Prior to Admission medications   Medication Sig Start Date End Date Taking? Authorizing Provider  ALPRAZolam Duanne Moron) 0.5 MG tablet Take 1 tablet (0.5 mg total) by mouth 2 (two) times daily as needed for anxiety or sleep. 04/06/16 04/06/17  Sable Feil, PA-C  alprazolam Duanne Moron) 2 MG tablet Take 1 mg by mouth 2 (two) times daily.    Historical Provider, MD  amoxicillin (AMOXIL) 500 MG tablet Take 1 tablet (500 mg total) by mouth 3 (three) times daily with meals. 11/09/16   Stepehn Eckard L Krayton Wortley, PA-C  aspirin 81 MG tablet  Take 81 mg by mouth as needed for pain.    Historical Provider, MD  fluticasone (FLONASE) 50 MCG/ACT nasal spray Place 2 sprays into both nostrils daily. 11/09/16   Eashan Schipani L Rockie Schnoor, PA-C  PHENObarbital (LUMINAL) 100 MG tablet Take 100 mg by mouth daily.    Historical Provider, MD  PHENobarbital (LUMINAL) 97.2 MG tablet Take 1 tablet (97.2 mg total) by mouth at bedtime. 04/06/16   Sable Feil, PA-C    Allergies Codeine  No family history on file.  Social History Social History  Substance Use Topics  . Smoking status: Current Every Day Smoker    Packs/day: 2.00    Years: 41.00    Types: Cigarettes  . Smokeless tobacco: Never Used  . Alcohol use Yes     Review of Systems  Constitutional: No fever/chills Eyes: No visual changes. ENT: Positive left ear pain without drainage. No nasal congestion, runny nose, sinus pressure, tinnitus or changes in hearing Cardiovascular: No chest pain. Respiratory:  No shortness of breath.  Musculoskeletal: Negative for neck pain or general myalgias.  Skin: Negative for rash. Neurological: Positive lightheadedness. Negative for headaches, focal weakness or numbness. No tingling. No dizziness. 10-point ROS otherwise negative.  ____________________________________________   PHYSICAL EXAM:  VITAL SIGNS: ED Triage Vitals  Enc Vitals Group     BP 11/09/16 1327 135/76     Pulse Rate  11/09/16 1327 79     Resp 11/09/16 1327 16     Temp 11/09/16 1327 98.2 F (36.8 C)     Temp Source 11/09/16 1327 Oral     SpO2 11/09/16 1327 96 %     Weight 11/09/16 1322 212 lb (96.2 kg)     Height 11/09/16 1322 5\' 10"  (1.778 m)     Head Circumference --      Peak Flow --      Pain Score 11/09/16 1323 5     Pain Loc --      Pain Edu? --      Excl. in [place]? --      Constitutional: Alert and oriented. Well appearing and in no acute distress. Eyes: Conjunctivae are normalWithout icterus, injection or discharge. No nystagmus.  Head: Atraumatic. ENT:      Ears:  Right TM visualized without erythema, bulging, effusion, perforation. Left TM could not be visualized due to impacted, dark brown, hardened cerumen noted in the posterior ear canal. Attempts by nursing to dislodge the cerumen was unsuccessful.      Nose: No congestion/rhinnorhea.      Mouth/Throat: Mucous membranes are moist. Pharynx without erythema, swelling, exudate. Uvula is midline. Airway is patent. Neck: No stridor. No carotid bruits. Supple with full range of motion. Hematological/Lymphatic/Immunilogical: No cervical lymphadenopathy. Cardiovascular: Normal rate, regular rhythm. Normal S1 and S2.  Good peripheral circulation. Respiratory: Normal respiratory effort without tachypnea or retractions. Lungs CTAB with breath sounds noted in all lung fields. No wheeze, rhonchi, rales Neurologic:  Normal speech and language. No gross focal neurologic deficits are appreciated.  Skin:  Skin is warm, dry and intact. No rash noted. Psychiatric: Mood and affect are normal. Speech and behavior are normal. Patient exhibits appropriate insight and judgement.   ____________________________________________   LABS  None ____________________________________________  EKG  None ____________________________________________  RADIOLOGY I, [place], personally viewed and evaluated these images (plain radiographs) as part of my medical decision making, as well as reviewing the written report by the radiologist.  No results found.  ____________________________________________    PROCEDURES  Procedure(s) performed: None   Procedures   Medications - No data to display   ____________________________________________   INITIAL IMPRESSION / ASSESSMENT AND PLAN / ED COURSE  Pertinent labs & imaging results that were available during my care of the patient were reviewed by me and considered in my medical decision making (see chart for details).     Patient's diagnosis is consistent  with left ear pain, impacted cerumen of the left ear and eustachian tube dysfunction of the left ear. Patient will be discharged home with prescriptions for amoxicillin and Flonase to take as directed. May take over-the-counter Tylenol or ibuprofen as needed for pain. Advise patient to utilize over-the-counter ear wax softening drops to alleviate cerumen impaction. Patient is to follow up with his primary care provider if symptoms persist past this treatment course. Patient is given ED precautions to return to the ED for any worsening or new symptoms.   ____________________________________________  FINAL CLINICAL IMPRESSION(S) / ED DIAGNOSES  Final diagnoses:  Left ear pain  Impacted cerumen of left ear  Dysfunction of left eustachian tube      NEW MEDICATIONS STARTED DURING THIS VISIT:  Discharge Medication List as of 11/09/2016  3:12 PM    START taking these medications   Details  amoxicillin (AMOXIL) 500 MG tablet Take 1 tablet (500 mg total) by mouth 3 (three) times daily with meals., Starting Tue  11/09/2016, Print    fluticasone (FLONASE) 50 MCG/ACT nasal spray Place 2 sprays into both nostrils daily., Starting Tue 11/09/2016, [place], PA-C 11/10/16 [place], MD 11/10/16 [phone]

## 2016-11-09 NOTE — ED Notes (Signed)
Left ear irrigated with 30 mls of water   W/o success

## 2016-11-22 ENCOUNTER — Other Ambulatory Visit: Payer: Self-pay | Admitting: Adult Health

## 2016-11-22 DIAGNOSIS — R918 Other nonspecific abnormal finding of lung field: Secondary | ICD-10-CM

## 2017-01-06 ENCOUNTER — Encounter: Payer: Self-pay | Admitting: Nurse Practitioner

## 2017-01-19 ENCOUNTER — Encounter: Payer: Self-pay | Admitting: Nurse Practitioner

## 2017-10-11 ENCOUNTER — Inpatient Hospital Stay: Payer: Medicaid Other

## 2017-10-11 ENCOUNTER — Emergency Department: Payer: Medicaid Other

## 2017-10-11 ENCOUNTER — Other Ambulatory Visit: Payer: Self-pay

## 2017-10-11 ENCOUNTER — Encounter
Admission: EM | Disposition: A | Discharge status: Home / Self Care | Payer: Self-pay | Source: Home / Self Care | Attending: Orthopedic Surgery

## 2017-10-11 ENCOUNTER — Inpatient Hospital Stay
Admission: EM | Admit: 2017-10-11 | Discharge: 2017-10-12 | DRG: 492 | Disposition: A | Discharge status: Home / Self Care | Payer: Medicaid Other | Attending: Orthopedic Surgery | Admitting: Orthopedic Surgery

## 2017-10-11 DIAGNOSIS — Y92007 Garden or yard of unspecified non-institutional (private) residence as the place of occurrence of the external cause: Secondary | ICD-10-CM

## 2017-10-11 DIAGNOSIS — G40909 Epilepsy, unspecified, not intractable, without status epilepticus: Secondary | ICD-10-CM | POA: Diagnosis present

## 2017-10-11 DIAGNOSIS — W010XXA Fall on same level from slipping, tripping and stumbling without subsequent striking against object, initial encounter: Secondary | ICD-10-CM | POA: Diagnosis present

## 2017-10-11 DIAGNOSIS — Z885 Allergy status to narcotic agent status: Secondary | ICD-10-CM

## 2017-10-11 DIAGNOSIS — S82839B Other fracture of upper and lower end of unspecified fibula, initial encounter for open fracture type I or II: Secondary | ICD-10-CM

## 2017-10-11 DIAGNOSIS — T148XXA Other injury of unspecified body region, initial encounter: Secondary | ICD-10-CM

## 2017-10-11 DIAGNOSIS — M21071 Valgus deformity, not elsewhere classified, right ankle: Secondary | ICD-10-CM | POA: Diagnosis present

## 2017-10-11 DIAGNOSIS — S82871B Displaced pilon fracture of right tibia, initial encounter for open fracture type I or II: Secondary | ICD-10-CM | POA: Diagnosis not present

## 2017-10-11 DIAGNOSIS — S82301B Unspecified fracture of lower end of right tibia, initial encounter for open fracture type I or II: Secondary | ICD-10-CM

## 2017-10-11 DIAGNOSIS — S82451B Displaced comminuted fracture of shaft of right fibula, initial encounter for open fracture type I or II: Secondary | ICD-10-CM | POA: Diagnosis present

## 2017-10-11 DIAGNOSIS — F17210 Nicotine dependence, cigarettes, uncomplicated: Secondary | ICD-10-CM | POA: Diagnosis present

## 2017-10-11 DIAGNOSIS — S82309B Unspecified fracture of lower end of unspecified tibia, initial encounter for open fracture type I or II: Secondary | ICD-10-CM | POA: Diagnosis present

## 2017-10-11 HISTORY — PX: ORIF ANKLE FRACTURE: SHX5408

## 2017-10-11 LAB — CBC
HCT: 41.7 % (ref 40.0–52.0)
Hemoglobin: 14.6 g/dL (ref 13.0–18.0)
MCH: 35.6 pg — ABNORMAL HIGH (ref 26.0–34.0)
MCHC: 35 g/dL (ref 32.0–36.0)
MCV: 101.6 fL — ABNORMAL HIGH (ref 80.0–100.0)
Platelets: 53 10*3/uL — ABNORMAL LOW (ref 150–440)
RBC: 4.11 MIL/uL — ABNORMAL LOW (ref 4.40–5.90)
RDW: 15.6 % — AB (ref 11.5–14.5)
WBC: 6.1 10*3/uL (ref 3.8–10.6)

## 2017-10-11 LAB — URINE DRUG SCREEN, QUALITATIVE (ARMC ONLY)
AMPHETAMINES, UR SCREEN: NOT DETECTED
Barbiturates, Ur Screen: POSITIVE — AB
Benzodiazepine, Ur Scrn: POSITIVE — AB
COCAINE METABOLITE, UR ~~LOC~~: NOT DETECTED
Cannabinoid 50 Ng, Ur ~~LOC~~: POSITIVE — AB
MDMA (ECSTASY) UR SCREEN: NOT DETECTED
METHADONE SCREEN, URINE: NOT DETECTED
OPIATE, UR SCREEN: POSITIVE — AB
Phencyclidine (PCP) Ur S: NOT DETECTED
Tricyclic, Ur Screen: NOT DETECTED

## 2017-10-11 LAB — BASIC METABOLIC PANEL
ANION GAP: 10 (ref 5–15)
BUN: 10 mg/dL (ref 6–20)
CALCIUM: 8.3 mg/dL — AB (ref 8.9–10.3)
CO2: 23 mmol/L (ref 22–32)
Chloride: 98 mmol/L — ABNORMAL LOW (ref 101–111)
Creatinine, Ser: 0.72 mg/dL (ref 0.61–1.24)
Glucose, Bld: 101 mg/dL — ABNORMAL HIGH (ref 65–99)
Potassium: 3.9 mmol/L (ref 3.5–5.1)
SODIUM: 131 mmol/L — AB (ref 135–145)

## 2017-10-11 LAB — ETHANOL: ALCOHOL ETHYL (B): 179 mg/dL — AB (ref <10)

## 2017-10-11 SURGERY — OPEN REDUCTION INTERNAL FIXATION (ORIF) ANKLE FRACTURE
Anesthesia: General | Site: Ankle | Laterality: Right | Wound class: Dirty or Infected

## 2017-10-11 MED ORDER — GENTAMICIN SULFATE 40 MG/ML IJ SOLN
1.5000 mg/kg | Freq: Once | INTRAVENOUS | Status: AC
  Administered 2017-10-11: 120 mg via INTRAVENOUS
  Filled 2017-10-11: qty 3

## 2017-10-11 MED ORDER — TETANUS-DIPHTH-ACELL PERTUSSIS 5-2.5-18.5 LF-MCG/0.5 IM SUSP
0.5000 mL | Freq: Once | INTRAMUSCULAR | Status: AC
  Administered 2017-10-11: 0.5 mL via INTRAMUSCULAR
  Filled 2017-10-11: qty 0.5

## 2017-10-11 MED ORDER — MORPHINE SULFATE (PF) 4 MG/ML IV SOLN
6.0000 mg | Freq: Once | INTRAVENOUS | Status: AC
Start: 2017-10-11 — End: 2017-10-11
  Administered 2017-10-11: 6 mg via INTRAVENOUS
  Filled 2017-10-11: qty 2

## 2017-10-11 MED ORDER — IPRATROPIUM-ALBUTEROL 0.5-2.5 (3) MG/3ML IN SOLN
3.0000 mL | Freq: Once | RESPIRATORY_TRACT | Status: AC
  Administered 2017-10-11: 3 mL via RESPIRATORY_TRACT

## 2017-10-11 MED ORDER — ONDANSETRON HCL 4 MG/2ML IJ SOLN
4.0000 mg | Freq: Once | INTRAMUSCULAR | Status: AC
  Administered 2017-10-11: 4 mg via INTRAVENOUS
  Filled 2017-10-11: qty 2

## 2017-10-11 MED ORDER — ACETAMINOPHEN 650 MG RE SUPP: 650.0000 mg | Freq: Four times a day (QID) | RECTAL | Status: DC | PRN

## 2017-10-11 MED ORDER — MORPHINE SULFATE (PF) 2 MG/ML IV SOLN
2.0000 mg | INTRAVENOUS | Status: DC | PRN
Start: 2017-10-11 — End: 2017-10-12

## 2017-10-11 MED ORDER — CEFAZOLIN SODIUM-DEXTROSE 2-4 GM/100ML-% IV SOLN
2.0000 g | Freq: Once | INTRAVENOUS | Status: AC
  Administered 2017-10-11: 2 g via INTRAVENOUS
  Filled 2017-10-11: qty 100

## 2017-10-11 SURGICAL SUPPLY — 64 items
BANDAGE ACE 4X5 VEL STRL LF (GAUZE/BANDAGES/DRESSINGS) IMPLANT
BANDAGE ACE 6X5 VEL STRL LF (GAUZE/BANDAGES/DRESSINGS) ×3 IMPLANT
CANISTER SUCT 1200ML W/VALVE (MISCELLANEOUS) IMPLANT
CANISTER SUCT 3000ML PPV (MISCELLANEOUS) ×9 IMPLANT
CHLORAPREP W/TINT 26ML (MISCELLANEOUS) IMPLANT
CLAMP LG COMBINATION (Clamp) ×6 IMPLANT
CLAMP LG MULTI PIN (Clamp) ×3 IMPLANT
CLAMP ROD ATTACHMENT (Clamp) ×6 IMPLANT
CUFF TOURN 24 STER (MISCELLANEOUS) ×3 IMPLANT
CUFF TOURN 30 STER DUAL PORT (MISCELLANEOUS) IMPLANT
DRAPE C-ARM 42X70 (DRAPES) ×3 IMPLANT
DRAPE C-ARMOR (DRAPES) ×3 IMPLANT
DRAPE FLUOR MINI C-ARM 54X84 (DRAPES) IMPLANT
DRAPE INCISE IOBAN 66X45 STRL (DRAPES) IMPLANT
DRAPE U-SHAPE 47X51 STRL (DRAPES) IMPLANT
DRSG EMULSION OIL 3X8 NADH (GAUZE/BANDAGES/DRESSINGS) IMPLANT
DRSG MEPITEL 4X7.2 (GAUZE/BANDAGES/DRESSINGS) ×3 IMPLANT
DRSG VAC ATS MED SENSATRAC (GAUZE/BANDAGES/DRESSINGS) ×3 IMPLANT
ELECT CAUTERY BLADE 6.4 (BLADE) IMPLANT
ELECT REM PT RETURN 9FT ADLT (ELECTROSURGICAL) ×3
ELECTRODE REM PT RTRN 9FT ADLT (ELECTROSURGICAL) ×1 IMPLANT
GAUZE PETRO XEROFOAM 1X8 (MISCELLANEOUS) ×3 IMPLANT
GAUZE SPONGE 4X4 12PLY STRL (GAUZE/BANDAGES/DRESSINGS) IMPLANT
GAUZE XEROFORM 4X4 STRL (GAUZE/BANDAGES/DRESSINGS) ×3 IMPLANT
GLOVE SURG SYN 9.0  PF PI (GLOVE) ×4
GLOVE SURG SYN 9.0 PF PI (GLOVE) ×2 IMPLANT
GOWN SRG 2XL LVL 4 RGLN SLV (GOWNS) ×1 IMPLANT
GOWN STRL NON-REIN 2XL LVL4 (GOWNS) ×2
GOWN STRL REUS W/ TWL LRG LVL3 (GOWN DISPOSABLE) ×1 IMPLANT
GOWN STRL REUS W/TWL LRG LVL3 (GOWN DISPOSABLE) ×2
HANDLE YANKAUER SUCT BULB TIP (MISCELLANEOUS) ×3 IMPLANT
HEMOVAC 400ML (MISCELLANEOUS)
IV NS IRRIG 3000ML ARTHROMATIC (IV SOLUTION) ×6 IMPLANT
KIT DRAIN HEMOVAC JP 7FR 400ML (MISCELLANEOUS) IMPLANT
KIT RM TURNOVER STRD PROC AR (KITS) ×3 IMPLANT
LABEL OR SOLS (LABEL) IMPLANT
NEEDLE HYPO 18GX1.5 BLUNT FILL (NEEDLE) ×3 IMPLANT
NS IRRIG 1000ML POUR BTL (IV SOLUTION) ×3 IMPLANT
NS IRRIG 500ML POUR BTL (IV SOLUTION) ×3 IMPLANT
PACK EXTREMITY ARMC (MISCELLANEOUS) ×3 IMPLANT
PAD ABD DERMACEA PRESS 5X9 (GAUZE/BANDAGES/DRESSINGS) IMPLANT
PAD CAST CTTN 4X4 STRL (SOFTGOODS) ×2 IMPLANT
PAD PREP 24X41 OB/GYN DISP (PERSONAL CARE ITEMS) ×3 IMPLANT
PADDING CAST COTTON 4X4 STRL (SOFTGOODS) ×4
PIN STEINMAN THR 5.0 (PIN) ×3 IMPLANT
PULSAVAC PLUS IRRIG FAN TIP (DISPOSABLE) ×3
ROD CRBN FBR LRG EX-FX 11X400 (Rod) ×6 IMPLANT
SCREW SCHNZ SD 5.0 60 THRD/150 (Screw) ×2 IMPLANT
SCRW SCHANZ SD 5.0 60 THRD/150 (Screw) ×6 IMPLANT
SPLINT CAST 1 STEP 5X30 WHT (MISCELLANEOUS) IMPLANT
SPONGE LAP 18X18 5 PK (GAUZE/BANDAGES/DRESSINGS) IMPLANT
STAPLER SKIN PROX 35W (STAPLE) IMPLANT
SUT ETHILON 3-0 FS-10 30 BLK (SUTURE)
SUT MNCRL AB 4-0 PS2 18 (SUTURE) IMPLANT
SUT VIC AB 0 CT1 36 (SUTURE) IMPLANT
SUT VIC AB 2-0 SH 27 (SUTURE)
SUT VIC AB 2-0 SH 27XBRD (SUTURE) IMPLANT
SUT VIC AB 3-0 SH 27 (SUTURE)
SUT VIC AB 3-0 SH 27X BRD (SUTURE) IMPLANT
SUTURE EHLN 3-0 FS-10 30 BLK (SUTURE) IMPLANT
SYR 10ML LL (SYRINGE) ×3 IMPLANT
SYR 50ML LL SCALE MARK (SYRINGE) ×3 IMPLANT
TIP FAN IRRIG PULSAVAC PLUS (DISPOSABLE) ×1 IMPLANT
WND VAC CANISTER 500ML (MISCELLANEOUS) ×3 IMPLANT

## 2017-10-11 NOTE — Anesthesia Preprocedure Evaluation (Signed)
Anesthesia Evaluation  Patient identified by MRN, date of birth, ID band Patient awake    Reviewed: Allergy & Precautions, NPO status , Patient's Chart, lab work & pertinent test results  History of Anesthesia Complications Negative for: history of anesthetic complications  Airway Mallampati: II       Dental  (+) Missing, Chipped   Pulmonary COPD,  COPD inhaler, Current Smoker,           Cardiovascular (-) hypertension+ angina with exertion (-) Past MI and (-) CHF (-) dysrhythmias (-) Valvular Problems/Murmurs     Neuro/Psych Seizures - (last about 4 months ago),     GI/Hepatic GERD  Poorly Controlled,(+)     substance abuse  alcohol use, Hepatitis - (untreated), C  Endo/Other  neg diabetes  Renal/GU Renal disease     Musculoskeletal   Abdominal   Peds  Hematology   Anesthesia Other Findings   Reproductive/Obstetrics                             Anesthesia Physical Anesthesia Plan  ASA: III and emergent  Anesthesia Plan: General   Post-op Pain Management:    Induction: Intravenous  PONV Risk Score and Plan: 1 and Ondansetron  Airway Management Planned: Oral ETT  Additional Equipment:   Intra-op Plan:   Post-operative Plan:   Informed Consent: I have reviewed the patients History and Physical, chart, labs and discussed the procedure including the risks, benefits and alternatives for the proposed anesthesia with the patient or authorized representative who has indicated his/her understanding and acceptance.     Plan Discussed with:   Anesthesia Plan Comments:         Anesthesia Quick Evaluation

## 2017-10-11 NOTE — H&P (Signed)
Subjective:   Patient is a 67 y.o. male presents with open right ankle injury. Onset of symptoms was abrupt starting 2 hours ago with unchanged course since that time. The pain is located around the ankle. Patient describes the pain as sharp continuous and rated as severe. Pain has been associated with a fall that he was at his brother's house stepped out on the steps to get a smoke after having drank a great deal of alcohol and fell he had immediate deformity felt bone break in and said his foot was pointed sideways is brought to the emergency room raise found to have an open medial tibial wound with a distal tibia fracture and comminuted distal fibula fracture. Patient denies any loss of sensation to the foot. Symptoms are aggravated by any movement of the foot. Symptoms improve with nothing. Past history includes prior left ankle injury with surgeries in past.  Previous studies include x-ray and CT.  Patient Active Problem List   Diagnosis Date Noted  . Chest pain 01/16/2013  . Tobacco abuse    Past Medical History:  Diagnosis Date  . Epilepsy ([place])   . Pneumonia   . Tobacco abuse     Past Surgical History:  Procedure Laterality Date  . LIVER CYST REMOVAL    . [place], "walking pna"     (Not in a hospital admission) Allergies  Allergen Reactions  . Codeine     Vomiting, chills, "deathly ill"    Social History   Tobacco Use  . Smoking status: Current Every Day Smoker    Packs/day: 2.00    Years: 41.00    Pack years: 82.00    Types: Cigarettes  . Smokeless tobacco: Never Used  Substance Use Topics  . Alcohol use: Yes    No family history on file.  Review of Systems Pertinent items are noted in HPI.  Objective:   Patient Vitals for the past 8 hrs:  BP Temp Temp src Pulse Resp SpO2 Height Weight  10/11/17 2045 103/70 - - 73 19 95 % - -  10/11/17 2015 (!) 143/120 - - 77 15 93 % - -  10/11/17 2008 (!) 143/120 97.7 F (36.5 C) Oral 78 19 94 % -  -  10/11/17 2005 - - - - - - 5\' 10"  (1.778 m) 93 kg (205 lb)   No intake/output data recorded. No intake/output data recorded.    BP 103/70   Pulse 73   Temp 97.7 F (36.5 C) (Oral)   Resp 19   Ht 5\' 10"  (1.778 m)   Wt 93 kg (205 lb)   SpO2 95%   BMI 29.41 kg/m  General appearance: alert, appears older than stated age, moderate distress and Chronically Ill appearing Lungs: clear to auscultation bilaterally Heart: regular rate and rhythm, S1, S2 normal, no murmur, click, rub or gallop Extremities: Pertinent exam is the right lower extremity has no apparent deformity to the other extremities. He has a dressing over the wound and this is left in place apparently has a large medial wound with bone exposed and was given initial Clem[place] in the ER by staff and then wrapped. He has sensation to the dorsum of the foot and plantar aspect of the foot he has a palpable dorsalis pedis posterior tib cannot be palpated with his per current wrap in place. He has brisk capillary refill. He is able flex extend the toes was severe pain there is a valgus deformity to  the lower leg is partially corrected with splint application   Data ReviewRadiology review: Radiographs show comminuted distal tibia fracture with CT scan ordered for further evaluation for posterior male malleolus there is a extensively comminuted distal fibula fracture as well noted there is soft tissue gas present consistent with open fracture  Assessment:   Active Problems:   * No active hospital problems. * Open distal tibia fracture with associated comminuted distal fibula fracture  Plan:   Admit for irrigation debridement and application of an external fixator plan on wound VAC application tonight and definitive treatment to be determined postop depending on wound appearance as well as fracture appearance. I discussed with the patient he seemed to understand despite having elevated alcohol level is a serious injury requires surgery  tonight on an emergency basis

## 2017-10-11 NOTE — ED Notes (Signed)
Patient transported to 147

## 2017-10-11 NOTE — ED Notes (Addendum)
Pt has open compound fracture to right ankle with bleeding noted. Pt states he had 6 beers tonight and took a xanax earlier this am. MD at bedside

## 2017-10-11 NOTE — ED Provider Notes (Addendum)
[facility] Emergency Department Provider Note   ____________________________________________   None    (approximate)  I have reviewed the triage vital signs and the nursing notes.   HISTORY  Chief Complaint Fall    HPI Glen Colon is a 67 y.o. male reports that he was with friends, he was walking down steps, he missed 1 of the steps and fell approximately 3 feet down landing directly on his right ankle.  He reports he felt it pop and roll underneath him.  He had severe pain with bleeding from the right ankle.  Reports ongoing severe pain in the right ankle.  Did not strike his head, did not hit his neck.  Reports that he broke his fall otherwise, but primarily fell directly on his right leg.  Reports severe pain, 10 out of 10, right ankle.  She previously injured the left leg need surgery   Past Medical History:  Diagnosis Date  . Epilepsy ([place])   . Pneumonia   . Tobacco abuse     Patient Active Problem List   Diagnosis Date Noted  . Chest pain 01/16/2013  . Tobacco abuse     Past Surgical History:  Procedure Laterality Date  . LIVER CYST REMOVAL    . [place], "walking pna"    Prior to Admission medications   Medication Sig Start Date End Date Taking? Authorizing Provider  alprazolam Duanne Moron) 2 MG tablet Take 1 mg by mouth 2 (two) times daily.    [provider]  amoxicillin (AMOXIL) 500 MG tablet Take 1 tablet (500 mg total) by mouth 3 (three) times daily with meals. 11/09/16   Hagler, Jami L, PA-C  aspirin 81 MG tablet Take 81 mg by mouth as needed for pain.    [provider]  fluticasone (FLONASE) 50 MCG/ACT nasal spray Place 2 sprays into both nostrils daily. 11/09/16   Hagler, Jami L, PA-C  PHENObarbital (LUMINAL) 100 MG tablet Take 100 mg by mouth daily.    [provider]  PHENobarbital (LUMINAL) 97.2 MG tablet Take 1 tablet (97.2 mg total) by mouth at bedtime. 04/06/16   Sable Feil, PA-C    Allergies Codeine  No family history on file.  Social History Social History   Tobacco Use  . Smoking status: Current Every Day Smoker    Packs/day: 2.00    Years: 41.00    Pack years: 82.00    Types: Cigarettes  . Smokeless tobacco: Never Used  Substance Use Topics  . Alcohol use: Yes  . Drug use: Not on file    Review of Systems Constitutional: No fever/chills or recent illness. Eyes: No visual changes. ENT:  No trouble breathing.  No neck pain. Cardiovascular: Denies chest pain. Respiratory: Denies shortness of breath. Gastrointestinal: No abdominal pain.  No nausea, no vomiting.  Genitourinary: Negative for dysuria. Musculoskeletal: Negative for back pain. Skin: Negative for rash. Neurological: Negative for headaches, focal weakness or numbness.  ____________________________________________   PHYSICAL EXAM:  VITAL SIGNS: ED Triage Vitals [10/11/17 2005]  Enc Vitals Group     BP      Pulse      Resp      Temp      Temp src      SpO2      Weight 205 lb (93 kg)     Height 5\' 10"  (1.778 m)     Head Circumference      Peak Flow  Pain Score 10     Pain Loc      Pain Edu?      Excl. in [place]?     Constitutional: Alert and oriented. Well appearing and in no acute distress. Eyes: Conjunctivae are normal. Head: Atraumatic. Nose: No congestion/rhinnorhea. Mouth/Throat: Mucous membranes are moist. Neck: No stridor.   Cardiovascular: Normal rate, regular rhythm. Grossly normal heart sounds.  Good peripheral circulation. Respiratory: Normal respiratory effort.  No retractions. Lungs CTAB. Gastrointestinal: Soft and nontender. No distention. Musculoskeletal:   Lower Extremities  No edema. Normal DP/PT pulses bilateral with good cap refill.  Normal neuro-motor function lower extremities bilateral.  RIGHT Right lower extremity demonstrates significant pain with bleeding and open laceration involving the distal leg with angulation of  the foot.  Dorsalis pedis and posterior tibial pulses are palpable.  He wiggles the toes and denies any neurologic deficits.  Does not appear to be any acute bony injury to the femur hip or knee, but certainly the right lower leg demonstrates an open fracture around the ankle region.  LEFT Left lower extremity demonstrates normal strength, good use of all muscles. No edema bruising or contusions of the hip,  knee, ankle. Full range of motion of the left lower extremity without pain. No pain on axial loading. No evidence of trauma.   Neurologic:  Normal speech and language. No gross focal neurologic deficits are appreciated.  Skin:  Skin is warm, dry and intact. No rash noted. Psychiatric: Mood and affect are normal. Speech and behavior are normal.  ____________________________________________   LABS (all labs ordered are listed, but only abnormal results are displayed)  Labs Reviewed  CBC - Abnormal; Notable for the following components:      Result Value   RBC 4.11 (*)    MCV 101.6 (*)    MCH 35.6 (*)    RDW 15.6 (*)    Platelets 53 (*)    All other components within normal limits  BASIC METABOLIC PANEL - Abnormal; Notable for the following components:   Sodium 131 (*)    Chloride 98 (*)    Glucose, Bld 101 (*)    Calcium 8.3 (*)    All other components within normal limits  ETHANOL - Abnormal; Notable for the following components:   Alcohol, Ethyl (B) 179 (*)    All other components within normal limits   ____________________________________________  EKG   ____________________________________________  RADIOLOGY  Dg Tibia/fibula Right  Result Date: 10/11/2017 CLINICAL DATA:  Fall injury to the ankle EXAM: RIGHT TIBIA AND FIBULA - 2 VIEW COMPARISON:  None. FINDINGS: Mild degenerative changes involving the medial compartment of the knee. Acute severely comminuted fracture involving distal shaft of the fibula with 19 mm horizontally oriented displaced shaft fracture  fragment. Moderate lateral angulation of distal fracture fragment. Additional fracture through the distal metaphysis of the tibia with less than 1/4 bone with of lateral displacement of distal fracture fragment. Moderate lateral angulation of distal tibial fracture fragment. Soft tissue gas lateral aspect of the right ankle. IMPRESSION: Severely comminuted and displaced distal fibular fracture with moderate angulation. Associated mildly displaced and angulated distal tibial fracture Electronically Signed   By: Donavan Foil M.D.   On: 10/11/2017 20:43    X-ray reviewed, severely comminuted distal fibula fracture with associated tibial fracture. ____________________________________________   PROCEDURES  Procedure(s) performed: None  Procedures  Critical Care performed: Yes, see critical care note(s) CRITICAL CARE Performed by: Delman Kitten   Total critical care time: 40 minutes  Critical care time was exclusive of separately billable procedures and treating other patients.  Critical care was necessary to treat or prevent imminent or life-threatening deterioration.  Critical care was time spent personally by me on the following activities: development of treatment plan with patient and/or surrogate as well as nursing, discussions with consultants, evaluation of patient's response to treatment, examination of patient, obtaining history from patient or surrogate, ordering and performing treatments and interventions, ordering and review of laboratory studies, ordering and review of radiographic studies, pulse oximetry and re-evaluation of patient's condition.  Open fracture requiring orthopedic and operative management ____________________________________________   INITIAL IMPRESSION / ASSESSMENT AND PLAN / ED COURSE  Pertinent labs & imaging results that were available during my care of the patient were reviewed by me and considered in my medical decision making (see chart for  details).   Patient returns after fall.  Denies head or neck injury.  No other obvious injuries other than clear open right distal leg/possible ankle fracture.  X-rays reveal comminuted fibular and also associated tibial fracture on the right.  No evidence of other injury.  Patient denies any other injury.  Low risk mechanism with regard to the low fall, he does also report no loss of consciousness and no head strike or other injury to the torso.  Clinical Course as of Oct 11 2137  Tue Oct 11, 2017  2045 Ortho consult paged. Open fracture.  [MQ]    Clinical Course User Index [MQ] Delman Kitten, MD   ----------------------------------------- 9:38 PM on 10/11/2017 -----------------------------------------  Dr. Rudene Christians admitting patient, plan to taken to the OR for further evaluation.  Patient resting comfortably awaiting further evaluation under the orthopedic service.  ____________________________________________   FINAL CLINICAL IMPRESSION(S) / ED DIAGNOSES  Final diagnoses:  Type I or II open fracture of distal end of right tibia, unspecified fracture morphology, initial encounter      NEW MEDICATIONS STARTED DURING THIS VISIT:  New Prescriptions   No medications on file     Note:  This document was prepared using Dragon voice recognition software and may include unintentional dictation errors.     Delman Kitten, MD 10/11/17 2139    Delman Kitten, MD 10/11/17 2211

## 2017-10-11 NOTE — ED Notes (Signed)
Pharmacy called and will send medication once verified

## 2017-10-11 NOTE — ED Notes (Signed)
OR called and requesting pt to come to PACU now instead of 1C and needing report. 1C called and notified Minette Brine that pt was going straight to OR.

## 2017-10-11 NOTE — ED Triage Notes (Signed)
Pt comes via [facility] with c/o fall. Pt had been drinking with friends tonight and stepped off porch and hurt his right ankle. Pt states his ankle was sideways and he straighten it out. Per EMS no LOC. Pt is A&OX4, respirations even and unlabored. Pt states pain 10/10

## 2017-10-12 ENCOUNTER — Inpatient Hospital Stay (HOSPITAL_COMMUNITY): Payer: Medicaid Other

## 2017-10-12 ENCOUNTER — Encounter: Payer: Self-pay | Admitting: Orthopedic Surgery

## 2017-10-12 ENCOUNTER — Inpatient Hospital Stay: Payer: Medicaid Other | Admitting: Anesthesiology

## 2017-10-12 ENCOUNTER — Inpatient Hospital Stay (HOSPITAL_COMMUNITY)
Admission: AD | Admit: 2017-10-12 | Discharge: 2017-10-18 | DRG: 492 | Disposition: A | Discharge status: Home Health | Payer: Medicaid Other | Source: Ambulatory Visit | Attending: Orthopedic Surgery | Admitting: Orthopedic Surgery

## 2017-10-12 DIAGNOSIS — K7460 Unspecified cirrhosis of liver: Secondary | ICD-10-CM | POA: Diagnosis present

## 2017-10-12 DIAGNOSIS — T148XXA Other injury of unspecified body region, initial encounter: Secondary | ICD-10-CM | POA: Diagnosis not present

## 2017-10-12 DIAGNOSIS — W1789XA Other fall from one level to another, initial encounter: Secondary | ICD-10-CM | POA: Diagnosis not present

## 2017-10-12 DIAGNOSIS — Z885 Allergy status to narcotic agent status: Secondary | ICD-10-CM | POA: Diagnosis not present

## 2017-10-12 DIAGNOSIS — S82831C Other fracture of upper and lower end of right fibula, initial encounter for open fracture type IIIA, IIIB, or IIIC: Secondary | ICD-10-CM | POA: Diagnosis present

## 2017-10-12 DIAGNOSIS — Z902 Acquired absence of lung [part of]: Secondary | ICD-10-CM

## 2017-10-12 DIAGNOSIS — E871 Hypo-osmolality and hyponatremia: Secondary | ICD-10-CM | POA: Diagnosis present

## 2017-10-12 DIAGNOSIS — F1210 Cannabis abuse, uncomplicated: Secondary | ICD-10-CM | POA: Diagnosis present

## 2017-10-12 DIAGNOSIS — S82871B Displaced pilon fracture of right tibia, initial encounter for open fracture type I or II: Secondary | ICD-10-CM | POA: Diagnosis not present

## 2017-10-12 DIAGNOSIS — D62 Acute posthemorrhagic anemia: Secondary | ICD-10-CM | POA: Diagnosis present

## 2017-10-12 DIAGNOSIS — E8889 Other specified metabolic disorders: Secondary | ICD-10-CM | POA: Diagnosis present

## 2017-10-12 DIAGNOSIS — G40909 Epilepsy, unspecified, not intractable, without status epilepticus: Secondary | ICD-10-CM | POA: Diagnosis present

## 2017-10-12 DIAGNOSIS — E559 Vitamin D deficiency, unspecified: Secondary | ICD-10-CM | POA: Diagnosis not present

## 2017-10-12 DIAGNOSIS — D6959 Other secondary thrombocytopenia: Secondary | ICD-10-CM | POA: Diagnosis present

## 2017-10-12 DIAGNOSIS — S82871C Displaced pilon fracture of right tibia, initial encounter for open fracture type IIIA, IIIB, or IIIC: Secondary | ICD-10-CM | POA: Diagnosis present

## 2017-10-12 DIAGNOSIS — R0902 Hypoxemia: Secondary | ICD-10-CM | POA: Diagnosis not present

## 2017-10-12 DIAGNOSIS — K5903 Drug induced constipation: Secondary | ICD-10-CM | POA: Diagnosis not present

## 2017-10-12 DIAGNOSIS — B182 Chronic viral hepatitis C: Secondary | ICD-10-CM | POA: Diagnosis present

## 2017-10-12 DIAGNOSIS — Z419 Encounter for procedure for purposes other than remedying health state, unspecified: Secondary | ICD-10-CM

## 2017-10-12 DIAGNOSIS — D696 Thrombocytopenia, unspecified: Secondary | ICD-10-CM | POA: Diagnosis present

## 2017-10-12 DIAGNOSIS — K7030 Alcoholic cirrhosis of liver without ascites: Secondary | ICD-10-CM | POA: Diagnosis not present

## 2017-10-12 DIAGNOSIS — F1020 Alcohol dependence, uncomplicated: Secondary | ICD-10-CM | POA: Diagnosis present

## 2017-10-12 DIAGNOSIS — F17210 Nicotine dependence, cigarettes, uncomplicated: Secondary | ICD-10-CM | POA: Diagnosis not present

## 2017-10-12 DIAGNOSIS — E291 Testicular hypofunction: Secondary | ICD-10-CM | POA: Diagnosis present

## 2017-10-12 DIAGNOSIS — Z720 Tobacco use: Secondary | ICD-10-CM | POA: Diagnosis not present

## 2017-10-12 DIAGNOSIS — S82873B Displaced pilon fracture of unspecified tibia, initial encounter for open fracture type I or II: Secondary | ICD-10-CM | POA: Diagnosis present

## 2017-10-12 DIAGNOSIS — Z01818 Encounter for other preprocedural examination: Secondary | ICD-10-CM

## 2017-10-12 HISTORY — DX: Vitamin D deficiency, unspecified: E55.9

## 2017-10-12 HISTORY — DX: Alcohol dependence, uncomplicated: F10.20

## 2017-10-12 HISTORY — DX: Unspecified cirrhosis of liver: K74.60

## 2017-10-12 HISTORY — DX: Chronic viral hepatitis C: B18.2

## 2017-10-12 HISTORY — DX: Cannabis abuse, uncomplicated: F12.10

## 2017-10-12 HISTORY — DX: Testicular hypofunction: E29.1

## 2017-10-12 LAB — COMPREHENSIVE METABOLIC PANEL
ALBUMIN: 2.7 g/dL — AB (ref 3.5–5.0)
ALT: 77 U/L — ABNORMAL HIGH (ref 17–63)
ANION GAP: 9 (ref 5–15)
AST: 127 U/L — ABNORMAL HIGH (ref 15–41)
Alkaline Phosphatase: 185 U/L — ABNORMAL HIGH (ref 38–126)
BUN: 11 mg/dL (ref 6–20)
CO2: 22 mmol/L (ref 22–32)
Calcium: 7.8 mg/dL — ABNORMAL LOW (ref 8.9–10.3)
Chloride: 101 mmol/L (ref 101–111)
Creatinine, Ser: 0.72 mg/dL (ref 0.61–1.24)
GFR calc Af Amer: >60 mL/min (ref >60)
Glucose, Bld: 111 mg/dL — ABNORMAL HIGH (ref 65–99)
POTASSIUM: 3.6 mmol/L (ref 3.5–5.1)
Sodium: 132 mmol/L — ABNORMAL LOW (ref 135–145)
TOTAL PROTEIN: 6.1 g/dL — AB (ref 6.5–8.1)
Total Bilirubin: 2.7 mg/dL — ABNORMAL HIGH (ref 0.3–1.2)

## 2017-10-12 MED ORDER — LORAZEPAM 2 MG/ML IJ SOLN: 1.0000 mg | Freq: Four times a day (QID) | INTRAMUSCULAR | Status: AC | PRN

## 2017-10-12 MED ORDER — METHOCARBAMOL 1000 MG/10ML IJ SOLN
1000.0000 mg | Freq: Four times a day (QID) | INTRAVENOUS | Status: DC
  Filled 2017-10-12 (×24): qty 10

## 2017-10-12 MED ORDER — OXYCODONE HCL 5 MG PO TABS
5.0000 mg | ORAL_TABLET | ORAL | Status: DC | PRN
  Administered 2017-10-13 – 2017-10-15 (×10): 10 mg via ORAL
  Administered 2017-10-15: 5 mg via ORAL
  Administered 2017-10-15 – 2017-10-18 (×10): 10 mg via ORAL
  Filled 2017-10-12 (×21): qty 2

## 2017-10-12 MED ORDER — THIAMINE HCL 100 MG/ML IJ SOLN: 100.0000 mg | Freq: Every day | INTRAMUSCULAR | Status: DC

## 2017-10-12 MED ORDER — FENTANYL CITRATE (PF) 100 MCG/2ML IJ SOLN
INTRAMUSCULAR | Status: DC | PRN
  Administered 2017-10-12 (×2): 50 ug via INTRAVENOUS
  Administered 2017-10-12: 100 ug via INTRAVENOUS

## 2017-10-12 MED ORDER — ONDANSETRON HCL 4 MG PO TABS: 4.0000 mg | ORAL_TABLET | Freq: Four times a day (QID) | ORAL | Status: DC | PRN

## 2017-10-12 MED ORDER — ZOLPIDEM TARTRATE 5 MG PO TABS: 5.0000 mg | ORAL_TABLET | Freq: Every evening | ORAL | 0 refills | Status: DC | PRN

## 2017-10-12 MED ORDER — MAGNESIUM CITRATE PO SOLN: 1.0000 | Freq: Once | ORAL | 0 refills | Status: DC | PRN

## 2017-10-12 MED ORDER — ADULT MULTIVITAMIN W/MINERALS CH
1.0000 | ORAL_TABLET | Freq: Every day | ORAL | Status: DC
  Administered 2017-10-12: 1 via ORAL
  Filled 2017-10-12: qty 1

## 2017-10-12 MED ORDER — ADULT MULTIVITAMIN W/MINERALS CH
1.0000 | ORAL_TABLET | Freq: Every day | ORAL | Status: DC
  Administered 2017-10-14 – 2017-10-18 (×5): 1 via ORAL
  Filled 2017-10-12 (×5): qty 1

## 2017-10-12 MED ORDER — OXYCODONE HCL 5 MG PO TABS: 5.0000 mg | ORAL_TABLET | ORAL | 0 refills | Status: DC | PRN

## 2017-10-12 MED ORDER — FOLIC ACID 1 MG PO TABS: 1.0000 mg | ORAL_TABLET | Freq: Every day | ORAL | 0 refills | Status: DC

## 2017-10-12 MED ORDER — LORAZEPAM 2 MG PO TABS: 0.0000 mg | ORAL_TABLET | Freq: Four times a day (QID) | ORAL | 0 refills | Status: DC

## 2017-10-12 MED ORDER — LORAZEPAM 1 MG PO TABS
1.0000 mg | ORAL_TABLET | Freq: Four times a day (QID) | ORAL | Status: AC | PRN
  Administered 2017-10-13: 1 mg via ORAL
  Filled 2017-10-12: qty 1

## 2017-10-12 MED ORDER — OXYCODONE HCL 5 MG PO TABS
10.0000 mg | ORAL_TABLET | ORAL | Status: DC | PRN
  Administered 2017-10-12 (×4): 10 mg via ORAL
  Filled 2017-10-12 (×4): qty 2

## 2017-10-12 MED ORDER — ONDANSETRON HCL 4 MG/2ML IJ SOLN: 4.0000 mg | Freq: Four times a day (QID) | INTRAMUSCULAR | Status: DC | PRN

## 2017-10-12 MED ORDER — CHLORHEXIDINE GLUCONATE 4 % EX LIQD
60.0000 mL | Freq: Once | CUTANEOUS | Status: AC
  Administered 2017-10-12: 4 via TOPICAL
  Filled 2017-10-12: qty 60

## 2017-10-12 MED ORDER — SUCCINYLCHOLINE CHLORIDE 20 MG/ML IJ SOLN
INTRAMUSCULAR | Status: DC | PRN
  Administered 2017-10-12: 140 mg via INTRAVENOUS

## 2017-10-12 MED ORDER — CEFAZOLIN SODIUM-DEXTROSE 2-4 GM/100ML-% IV SOLN: 2.0000 g | Freq: Three times a day (TID) | INTRAVENOUS | 0 refills | Status: DC

## 2017-10-12 MED ORDER — ALPRAZOLAM 1 MG PO TABS: 2.0000 mg | ORAL_TABLET | Freq: Two times a day (BID) | ORAL | Status: DC

## 2017-10-12 MED ORDER — METHOCARBAMOL 500 MG PO TABS
1000.0000 mg | ORAL_TABLET | Freq: Four times a day (QID) | ORAL | Status: DC
  Administered 2017-10-12 – 2017-10-18 (×20): 1000 mg via ORAL
  Filled 2017-10-12 (×23): qty 2

## 2017-10-12 MED ORDER — SUGAMMADEX SODIUM 200 MG/2ML IV SOLN
INTRAVENOUS | Status: DC | PRN
  Administered 2017-10-12: 200 mg via INTRAVENOUS

## 2017-10-12 MED ORDER — FENTANYL CITRATE (PF) 100 MCG/2ML IJ SOLN
INTRAMUSCULAR | Status: AC
  Administered 2017-10-12: 25 ug via INTRAVENOUS
  Filled 2017-10-12: qty 2

## 2017-10-12 MED ORDER — DOCUSATE SODIUM 100 MG PO CAPS
100.0000 mg | ORAL_CAPSULE | Freq: Two times a day (BID) | ORAL | Status: DC
  Administered 2017-10-12 (×2): 100 mg via ORAL
  Filled 2017-10-12 (×2): qty 1

## 2017-10-12 MED ORDER — METHOCARBAMOL 1000 MG/10ML IJ SOLN
500.0000 mg | Freq: Four times a day (QID) | INTRAMUSCULAR | Status: DC | PRN
  Filled 2017-10-12: qty 5

## 2017-10-12 MED ORDER — THIAMINE HCL 100 MG PO TABS: 100.0000 mg | ORAL_TABLET | Freq: Every day | ORAL | 0 refills | Status: DC

## 2017-10-12 MED ORDER — MIDAZOLAM HCL 2 MG/2ML IJ SOLN
INTRAMUSCULAR | Status: DC | PRN
  Administered 2017-10-12: 2 mg via INTRAVENOUS

## 2017-10-12 MED ORDER — METOCLOPRAMIDE HCL 5 MG/ML IJ SOLN: 5.0000 mg | Freq: Three times a day (TID) | INTRAMUSCULAR | Status: DC | PRN

## 2017-10-12 MED ORDER — LACTATED RINGERS IV SOLN
INTRAVENOUS | Status: DC | PRN
  Administered 2017-10-12: via INTRAVENOUS

## 2017-10-12 MED ORDER — ACETAMINOPHEN 325 MG PO TABS: 650.0000 mg | ORAL_TABLET | Freq: Four times a day (QID) | ORAL | Status: DC | PRN

## 2017-10-12 MED ORDER — METOCLOPRAMIDE HCL 10 MG PO TABS: 5.0000 mg | ORAL_TABLET | Freq: Three times a day (TID) | ORAL | Status: DC | PRN

## 2017-10-12 MED ORDER — PHENOBARBITAL 97.2 MG PO TABS
97.2000 mg | ORAL_TABLET | Freq: Every day | ORAL | Status: DC
  Administered 2017-10-12 – 2017-10-17 (×6): 97.2 mg via ORAL
  Filled 2017-10-12: qty 1
  Filled 2017-10-12: qty 3
  Filled 2017-10-12 (×4): qty 1

## 2017-10-12 MED ORDER — CEFAZOLIN SODIUM-DEXTROSE 2-4 GM/100ML-% IV SOLN
2.0000 g | Freq: Three times a day (TID) | INTRAVENOUS | Status: DC
  Administered 2017-10-12 (×2): 2 g via INTRAVENOUS
  Filled 2017-10-12 (×5): qty 100

## 2017-10-12 MED ORDER — FENTANYL CITRATE (PF) 100 MCG/2ML IJ SOLN
25.0000 ug | INTRAMUSCULAR | Status: AC | PRN
  Administered 2017-10-12 (×6): 25 ug via INTRAVENOUS

## 2017-10-12 MED ORDER — LORAZEPAM 1 MG PO TABS: 1.0000 mg | ORAL_TABLET | Freq: Four times a day (QID) | ORAL | 0 refills | Status: DC | PRN

## 2017-10-12 MED ORDER — GENTAMICIN SULFATE 40 MG/ML IJ SOLN
7.0000 mg/kg | INTRAMUSCULAR | Status: DC
  Filled 2017-10-12: qty 14.25

## 2017-10-12 MED ORDER — MORPHINE SULFATE (PF) 2 MG/ML IV SOLN: 2.0000 mg | INTRAVENOUS | 0 refills | Status: DC | PRN

## 2017-10-12 MED ORDER — VITAMIN B-1 100 MG PO TABS
100.0000 mg | ORAL_TABLET | Freq: Every day | ORAL | Status: DC
  Administered 2017-10-14 – 2017-10-18 (×5): 100 mg via ORAL
  Filled 2017-10-12 (×5): qty 1

## 2017-10-12 MED ORDER — LORAZEPAM 2 MG PO TABS: 0.0000 mg | ORAL_TABLET | Freq: Two times a day (BID) | ORAL | Status: DC

## 2017-10-12 MED ORDER — SODIUM CHLORIDE 0.9 % IV SOLN
INTRAVENOUS | Status: DC
  Administered 2017-10-12: 02:00:00 via INTRAVENOUS

## 2017-10-12 MED ORDER — LORAZEPAM 2 MG/ML IJ SOLN: 1.0000 mg | Freq: Four times a day (QID) | INTRAMUSCULAR | Status: DC | PRN

## 2017-10-12 MED ORDER — HYDROMORPHONE HCL 1 MG/ML IJ SOLN
0.5000 mg | INTRAMUSCULAR | Status: DC | PRN
  Administered 2017-10-12: 1 mg via INTRAVENOUS
  Administered 2017-10-13: 0.5 mg via INTRAVENOUS
  Filled 2017-10-12 (×2): qty 1

## 2017-10-12 MED ORDER — BISACODYL 10 MG RE SUPP: 10.0000 mg | Freq: Every day | RECTAL | Status: DC | PRN

## 2017-10-12 MED ORDER — BISACODYL 10 MG RE SUPP: 10.0000 mg | Freq: Every day | RECTAL | 0 refills | Status: DC | PRN

## 2017-10-12 MED ORDER — MAGNESIUM CITRATE PO SOLN
1.0000 | Freq: Once | ORAL | Status: DC | PRN
  Filled 2017-10-12: qty 296

## 2017-10-12 MED ORDER — CEFAZOLIN SODIUM-DEXTROSE 1-4 GM/50ML-% IV SOLN
1.0000 g | Freq: Three times a day (TID) | INTRAVENOUS | Status: DC
  Administered 2017-10-13: 1 g via INTRAVENOUS
  Administered 2017-10-13: 2 g via INTRAVENOUS
  Filled 2017-10-12 (×4): qty 50

## 2017-10-12 MED ORDER — MAGNESIUM HYDROXIDE 400 MG/5ML PO SUSP: 30.0000 mL | Freq: Every day | ORAL | Status: DC | PRN

## 2017-10-12 MED ORDER — FOLIC ACID 1 MG PO TABS
1.0000 mg | ORAL_TABLET | Freq: Every day | ORAL | Status: DC
  Administered 2017-10-14 – 2017-10-18 (×5): 1 mg via ORAL
  Filled 2017-10-12 (×5): qty 1

## 2017-10-12 MED ORDER — METHOCARBAMOL 500 MG PO TABS
500.0000 mg | ORAL_TABLET | Freq: Four times a day (QID) | ORAL | Status: DC | PRN
  Filled 2017-10-12: qty 1

## 2017-10-12 MED ORDER — DOCUSATE SODIUM 100 MG PO CAPS: 100.0000 mg | ORAL_CAPSULE | Freq: Two times a day (BID) | ORAL | 0 refills | Status: DC

## 2017-10-12 MED ORDER — ONDANSETRON HCL 4 MG/2ML IJ SOLN
INTRAMUSCULAR | Status: DC | PRN
  Administered 2017-10-12: 4 mg via INTRAVENOUS

## 2017-10-12 MED ORDER — VITAMIN B-1 100 MG PO TABS: 100.0000 mg | ORAL_TABLET | Freq: Every day | ORAL | Status: DC

## 2017-10-12 MED ORDER — OXYCODONE HCL 5 MG PO TABS
5.0000 mg | ORAL_TABLET | ORAL | Status: DC | PRN
  Administered 2017-10-12: 5 mg via ORAL
  Filled 2017-10-12: qty 1

## 2017-10-12 MED ORDER — ACETAMINOPHEN 650 MG RE SUPP: 650.0000 mg | Freq: Four times a day (QID) | RECTAL | 0 refills | Status: DC | PRN

## 2017-10-12 MED ORDER — ZOLPIDEM TARTRATE 5 MG PO TABS: 5.0000 mg | ORAL_TABLET | Freq: Every evening | ORAL | Status: DC | PRN

## 2017-10-12 MED ORDER — DOCUSATE SODIUM 100 MG PO CAPS
100.0000 mg | ORAL_CAPSULE | Freq: Two times a day (BID) | ORAL | Status: DC
  Administered 2017-10-12 – 2017-10-18 (×10): 100 mg via ORAL
  Filled 2017-10-12 (×11): qty 1

## 2017-10-12 MED ORDER — MAGNESIUM HYDROXIDE 400 MG/5ML PO SUSP: 30.0000 mL | Freq: Every day | ORAL | 0 refills | Status: DC | PRN

## 2017-10-12 MED ORDER — PHENOBARBITAL 97.2 MG PO TABS
97.2000 mg | ORAL_TABLET | Freq: Every day | ORAL | Status: DC
  Administered 2017-10-12: 97.2 mg via ORAL
  Filled 2017-10-12: qty 1

## 2017-10-12 MED ORDER — ONDANSETRON HCL 4 MG PO TABS: 4.0000 mg | ORAL_TABLET | Freq: Four times a day (QID) | ORAL | 0 refills | Status: DC | PRN

## 2017-10-12 MED ORDER — THIAMINE HCL 100 MG/ML IJ SOLN
100.0000 mg | Freq: Every day | INTRAMUSCULAR | Status: DC
  Administered 2017-10-14: 09:00:00 via INTRAVENOUS
  Filled 2017-10-12 (×2): qty 2

## 2017-10-12 MED ORDER — LORAZEPAM 2 MG PO TABS
0.0000 mg | ORAL_TABLET | Freq: Four times a day (QID) | ORAL | Status: DC
  Administered 2017-10-12: 2 mg via ORAL
  Filled 2017-10-12: qty 1

## 2017-10-12 MED ORDER — VITAMIN B-1 100 MG PO TABS
100.0000 mg | ORAL_TABLET | Freq: Every day | ORAL | Status: DC
  Administered 2017-10-12: 100 mg via ORAL
  Filled 2017-10-12: qty 1

## 2017-10-12 MED ORDER — LORAZEPAM 1 MG PO TABS: 1.0000 mg | ORAL_TABLET | Freq: Four times a day (QID) | ORAL | Status: DC | PRN

## 2017-10-12 MED ORDER — ONDANSETRON HCL 4 MG/2ML IJ SOLN: 4.0000 mg | Freq: Once | INTRAMUSCULAR | Status: DC | PRN

## 2017-10-12 MED ORDER — METOCLOPRAMIDE HCL 5 MG PO TABS: 5.0000 mg | ORAL_TABLET | Freq: Three times a day (TID) | ORAL | 0 refills | Status: DC | PRN

## 2017-10-12 MED ORDER — LACTATED RINGERS IV SOLN
INTRAVENOUS | Status: DC
  Administered 2017-10-12: 22:00:00 via INTRAVENOUS

## 2017-10-12 MED ORDER — GENTAMICIN SULFATE 40 MG/ML IJ SOLN: 7.0000 mg/kg | INTRAVENOUS | 0 refills | Status: DC

## 2017-10-12 MED ORDER — ADULT MULTIVITAMIN W/MINERALS CH: 1.0000 | ORAL_TABLET | Freq: Every day | ORAL | 0 refills | Status: DC

## 2017-10-12 MED ORDER — ROCURONIUM BROMIDE 100 MG/10ML IV SOLN
INTRAVENOUS | Status: DC | PRN
  Administered 2017-10-12: 30 mg via INTRAVENOUS

## 2017-10-12 MED ORDER — FOLIC ACID 1 MG PO TABS
1.0000 mg | ORAL_TABLET | Freq: Every day | ORAL | Status: DC
  Administered 2017-10-12: 1 mg via ORAL
  Filled 2017-10-12: qty 1

## 2017-10-12 MED ORDER — METHOCARBAMOL 500 MG PO TABS: 500.0000 mg | ORAL_TABLET | Freq: Four times a day (QID) | ORAL | 0 refills | Status: DC | PRN

## 2017-10-12 MED ORDER — PROPOFOL 10 MG/ML IV BOLUS
INTRAVENOUS | Status: DC | PRN
  Administered 2017-10-12: 200 mg via INTRAVENOUS

## 2017-10-12 MED ORDER — ACETAMINOPHEN 650 MG RE SUPP: 650.0000 mg | Freq: Four times a day (QID) | RECTAL | Status: DC | PRN

## 2017-10-12 MED ORDER — OXYCODONE HCL 10 MG PO TABS: 10.0000 mg | ORAL_TABLET | ORAL | 0 refills | Status: DC | PRN

## 2017-10-12 MED ORDER — POLYETHYLENE GLYCOL 3350 17 G PO PACK
17.0000 g | PACK | Freq: Every day | ORAL | Status: DC
  Administered 2017-10-12 – 2017-10-15 (×3): 17 g via ORAL
  Filled 2017-10-12 (×6): qty 1

## 2017-10-12 NOTE — Progress Notes (Signed)
Pt has bed at [facility] on 5N01. Report called to Maudie Mercury, Therapist, sports. Pt VSS, in NAD at time of transfer. [facility] transfer set up. Pt sent with all of his belongings. Wound vac clamped prior to leaving-notified receiving RN to have hospital wound vac ready.   [place], Jerry Caras

## 2017-10-12 NOTE — Op Note (Signed)
10/12/2017  1:31 AM  PATIENT:  Glen Colon  67 y.o. male  PRE-OPERATIVE DIAGNOSIS:  right ankle fracture open pilon  POST-OPERATIVE DIAGNOSIS:  same  PROCEDURE:  Procedure(s): I & D right ankle with application of external fixator and wound vac (Right)  SURGEON: Laurene Footman, MD  ASSISTANTS: None  ANESTHESIA:   general  EBL:  Total I/O In: -  Out: 275 [Urine:275]  BLOOD ADMINISTERED:none  DRAINS: Wound VAC applied   LOCAL MEDICATIONS USED:  NONE  SPECIMEN:  No Specimen  DISPOSITION OF SPECIMEN:  N/A  COUNTS:  YES  TOURNIQUET:  * Missing tourniquet times found for documented tourniquets in log: [id number] *  IMPLANTS: Synthes external fixator with Schanz pins  DICTATION: .Dragon Dictation patient brought the operating room and after adequate anesthesia was obtained the right leg was prepped and draped in sterile manner using Betadine. The open fracture had been wound approximately 5 cm across the medial distal tibia with bone exposed. There is no prescription gross contamination. After having prepped and draped in appropriate patient identification and timeout procedure were completed the open fracture was re-created with the bone protruding through the skin and on examination again no foreign material present this was thoroughly irrigated first with 3 L of saline with dilute Betadine followed by 3 L of saline following this the external fixation was applied with 2 pins placed proximally in the tibia first predrilling the hole and placing the Schanz pen and then placing a third pin through the calcaneus after making a small stab incision drilling and placing the pin the clamps were then attached to the pins and long carbon fiber rods provisionally fixed to the clamps at this point traction was applied through the heel pad in and bump was placed posteriorly to prevent deformity on the lateral view with AP and lateral images being utilized during the procedure and when acceptable  alignment was obtained the clamps were tightened to maintain this position and maintain length. At this point Xeroform was placed around the pin sites and a wound VAC was applied over the open fracture site with Mepitel followed by Pearline Cables foam and then the wound VAC patient was then sent to recovery room in stable condition  PLAN OF CARE: Admit to inpatient   PATIENT DISPOSITION:  PACU - hemodynamically stable.

## 2017-10-12 NOTE — Progress Notes (Signed)
Patient much more comfortable, significant drainage through wound vac. Able to flex and extend toes, sensation to toes intact. Plan transfer to [facility] to Ortho trauma service, Dr Marcelino Scot.

## 2017-10-12 NOTE — Anesthesia Procedure Notes (Signed)
Procedure Name: Intubation Date/Time: 10/12/2017 12:21 AM Performed by: Lendon Colonel, CRNA Pre-anesthesia Checklist: Patient identified, Emergency Drugs available, Suction available, Patient being monitored and Timeout performed Patient Re-evaluated:Patient Re-evaluated prior to induction Oxygen Delivery Method: Circle system utilized Preoxygenation: Pre-oxygenation with 100% oxygen Induction Type: IV induction, Rapid sequence and Cricoid Pressure applied Laryngoscope Size: McGraph Grade View: Grade III Tube size: 7.5 mm Number of attempts: 3 Airway Equipment and Method: Stylet Placement Confirmation: ETT inserted through vocal cords under direct vision,  positive ETCO2 and breath sounds checked- equal and bilateral Secured at: 21 cm Tube secured with: Tape Dental Injury: Teeth and Oropharynx as per pre-operative assessment and Injury to lip  Difficulty Due To: Difficulty was unanticipated and Difficult Airway- due to anterior larynx Future Recommendations: Recommend- induction with short-acting agent, and alternative techniques readily available

## 2017-10-12 NOTE — Discharge Summary (Signed)
Physician Discharge Summary  Patient ID: Glen Colon MRN: [id number] DOB/AGE: 04/27/1951 67 y.o.  Admit date: 10/11/2017 Discharge date: 10/12/2017  Admission Diagnoses:  Fracture [T14.8XXA] Type I or II open fracture of distal end of right tibia, unspecified fracture morphology, initial encounter [S82.301B]   Discharge Diagnoses: Patient Active Problem List   Diagnosis Date Noted  . Open fracture of distal end of fibula and tibia 10/11/2017  . Chest pain 01/16/2013  . Tobacco abuse     Past Medical History:  Diagnosis Date  . Epilepsy ([place])   . Pneumonia   . Tobacco abuse      Transfusion: none   Consultants (if any):   Discharged Condition: Improved  Hospital Course: Glen Colon is an 67 y.o. male who was admitted 10/11/2017 with a diagnosis of open right ankle fracture and went to the operating room on 10/12/2017 and underwent the above named procedures.   On postop day 1, patient's pain had improved.  Patient with moderate pain.  He is neurovascular intact.  IV antibiotics are continued.  Patient is stable and ready for transfer to trauma surgeon   Surgeries: Procedure(s): I & D right ankle with application of external fixator and wound vac on 10/12/2017 Patient tolerated the surgery well. Taken to PACU where she was stabilized and then transferred to the orthopedic floor.    Implants: Synthes external fixator with Schanz pins    He was given perioperative antibiotics:  Anti-infectives (From admission, onward)   Start     Dose/Rate Route Frequency Ordered Stop   10/12/17 2200  gentamicin (GARAMYCIN) 570 mg in dextrose 5 % 100 mL IVPB     7 mg/kg  81 kg (Adjusted) 114.3 mL/hr over 60 Minutes Intravenous Every 24 hours 10/12/17 0211     10/12/17 0300  ceFAZolin (ANCEF) IVPB 2g/100 mL premix     2 g 200 mL/hr over 30 Minutes Intravenous Every 8 hours 10/12/17 0250 10/15/17 0559   10/12/17 0000  ceFAZolin (ANCEF) 2-4 GM/100ML-% IVPB     2 g 200 mL/hr over 30  Minutes Intravenous Every 8 hours 10/12/17 0820     10/12/17 0000  gentamicin 570 mg in dextrose 5 % 100 mL     7 mg/kg  81 kg (Adjusted) 114.3 mL/hr over 60 Minutes Intravenous Every 24 hours 10/12/17 0820     10/11/17 2145  gentamicin (GARAMYCIN) 120 mg in dextrose 5 % 50 mL IVPB     1.5 mg/kg  81 kg (Adjusted) 106 mL/hr over 30 Minutes Intravenous  Once 10/11/17 2142 10/11/17 2243   10/11/17 2015  ceFAZolin (ANCEF) IVPB 2g/100 mL premix     2 g 200 mL/hr over 30 Minutes Intravenous  Once 10/11/17 2012 10/11/17 2112    .    He benefited maximally from the hospital stay and there were no complications.    Recent vital signs:  Vitals:   10/12/17 0742 10/12/17 0746  BP: (!) 143/63   Pulse: 91   Resp: 18   Temp: 98.6 F (37 C)   SpO2: 96% 95%    Recent laboratory studies:  Lab Results  Component Value Date   HGB 14.6 10/11/2017   HGB 15.9 01/11/2013   HGB 17.5 10/21/2011   Lab Results  Component Value Date   WBC 6.1 10/11/2017   PLT 53 (L) 10/11/2017   Lab Results  Component Value Date   INR 1.1 10/21/2011   Lab Results  Component Value Date   NA 132 (L) 10/12/2017  K 3.6 10/12/2017   CL 101 10/12/2017   CO2 22 10/12/2017   BUN 11 10/12/2017   CREATININE 0.72 10/12/2017   GLUCOSE 111 (H) 10/12/2017    Discharge Medications:   Allergies as of 10/12/2017      Reactions   Codeine    Vomiting, chills, "deathly ill"      Medication List    TAKE these medications   acetaminophen 650 MG suppository Commonly known as:  TYLENOL Place 1 suppository (650 mg total) rectally every 6 (six) hours as needed for mild pain (or Fever >/= 101).   alprazolam 2 MG tablet Commonly known as:  XANAX Take 1 mg by mouth 2 (two) times daily.   bisacodyl 10 MG suppository Commonly known as:  DULCOLAX Place 1 suppository (10 mg total) rectally daily as needed for moderate constipation.   ceFAZolin 2-4 GM/100ML-% IVPB Commonly known as:  ANCEF Inject 100 mLs (2 g  total) into the vein every 8 (eight) hours.   docusate sodium 100 MG capsule Commonly known as:  COLACE Take 1 capsule (100 mg total) by mouth 2 (two) times daily.   folic acid 1 MG tablet Commonly known as:  FOLVITE Take 1 tablet (1 mg total) by mouth daily.   gentamicin 570 mg in dextrose 5 % 100 mL Inject 570 mg into the vein daily.   LORazepam 1 MG tablet Commonly known as:  ATIVAN Take 1 tablet (1 mg total) by mouth every 6 (six) hours as needed (CIWA-AR > 8  -OR-  withdrawal symptoms:  anxiety, agitation, insomnia, diaphoresis, nausea, vomiting, tremors, tachycardia, or hypertension.).   LORazepam 2 MG tablet Commonly known as:  ATIVAN Take 0-2 tablets (0-4 mg total) by mouth every 6 (six) hours.   magnesium citrate Soln Take 296 mLs (1 Bottle total) by mouth once as needed for severe constipation.   magnesium hydroxide 400 MG/5ML suspension Commonly known as:  MILK OF MAGNESIA Take 30 mLs by mouth daily as needed for mild constipation.   methocarbamol 500 MG tablet Commonly known as:  ROBAXIN Take 1 tablet (500 mg total) by mouth every 6 (six) hours as needed for muscle spasms.   metoCLOPramide 5 MG tablet Commonly known as:  REGLAN Take 1-2 tablets (5-10 mg total) by mouth every 8 (eight) hours as needed for nausea (if ondansetron (ZOFRAN) ineffective.).   morphine 2 MG/ML injection Inject 1 mL (2 mg total) into the vein every hour as needed.   multivitamin with minerals Tabs tablet Take 1 tablet by mouth daily.   ondansetron 4 MG tablet Commonly known as:  ZOFRAN Take 1 tablet (4 mg total) by mouth every 6 (six) hours as needed for nausea.   oxyCODONE 5 MG immediate release tablet Commonly known as:  Oxy IR/ROXICODONE Take 1 tablet (5 mg total) by mouth every 3 (three) hours as needed for moderate pain ((score 4 to 6)).   Oxycodone HCl 10 MG Tabs Take 1 tablet (10 mg total) by mouth every 3 (three) hours as needed for severe pain ((score 7 to 10)).    PHENObarbital 100 MG tablet Commonly known as:  LUMINAL Take 100 mg by mouth at bedtime.   thiamine 100 MG tablet Take 1 tablet (100 mg total) by mouth daily.   zolpidem 5 MG tablet Commonly known as:  AMBIEN Take 1 tablet (5 mg total) by mouth at bedtime as needed and may repeat dose one time if needed for sleep.       Diagnostic Studies: Dg  Tibia/fibula Right  Result Date: 10/11/2017 CLINICAL DATA:  Fall injury to the ankle EXAM: RIGHT TIBIA AND FIBULA - 2 VIEW COMPARISON:  None. FINDINGS: Mild degenerative changes involving the medial compartment of the knee. Acute severely comminuted fracture involving distal shaft of the fibula with 19 mm horizontally oriented displaced shaft fracture fragment. Moderate lateral angulation of distal fracture fragment. Additional fracture through the distal metaphysis of the tibia with less than 1/4 bone with of lateral displacement of distal fracture fragment. Moderate lateral angulation of distal tibial fracture fragment. Soft tissue gas lateral aspect of the right ankle. IMPRESSION: Severely comminuted and displaced distal fibular fracture with moderate angulation. Associated mildly displaced and angulated distal tibial fracture Electronically Signed   By: Donavan Foil M.D.   On: 10/11/2017 20:43   Dg Ankle 2 Views Right  Addendum Date: 10/12/2017   ADDENDUM REPORT: 10/12/2017 02:11 ADDENDUM: Fluoro time 45 seconds. Electronically Signed   By: Kristine Garbe M.D.   On: 10/12/2017 02:11   Result Date: 10/12/2017 CLINICAL DATA:  67 y/o  M; right ankle open reduction and fixation. EXAM: DG C-ARM 61-120 MIN; RIGHT ANKLE - 2 VIEW COMPARISON:  10/11/2017 CT ankle FINDINGS: Two intraoperative fluoroscopic images demonstrated comminuted fracture deformities of the distal fibula and tibia with intra-articular extension. IMPRESSION: Comminuted distal fibula and tibia fractures with intra-articular extension. Electronically Signed: By: Kristine Garbe M.D. On: 10/12/2017 01:50   Ct Ankle Right Wo Contrast  Result Date: 10/11/2017 CLINICAL DATA:  67 year old male with fall and fracture of the right ankle. EXAM: CT OF THE RIGHT ANKLE WITHOUT CONTRAST TECHNIQUE: Multidetector CT imaging of the right ankle was performed according to the standard protocol. Multiplanar CT image reconstructions were also generated. COMPARISON:  Radiograph dated 10/11/2017 FINDINGS: Bones/Joint/Cartilage There is a severely comminuted and impacted fracture of the distal tibia with extension into the articular surface of the tibial plafond. The fracture has heart is onto all and vertical components. There is approximately 6 mm lateral displacement of the lateral distal fracture fragment in relation to the tibial diaphysis. There is extension of a displaced fracture fragment through the skin along the medial aspect of the distal tibial metadiaphysis. Severely comminuted fracture of the distal fibular diaphysis with mild lateral angulation of the main distal fracture fragment. Multiple small fracture fragments noted along the lateral surface of the tibial plafond. There is slight widening of the lateral talotibial joint space without significant dislocation. Small pockets of air noted in the ankle joint. Ligaments Suboptimally assessed by CT. Muscles and Tendons No large intramuscular fluid collection or hematoma. Soft tissues Open wound along the medial aspect of the distal tibial metadiaphysis with small fracture fragments. Diffuse pockets of air in the superficial and deep fascia. IMPRESSION: 1. Severely comminuted and intra-articular open fracture of the distal tibia. 2. Comminuted fractures of the distal fibular diaphysis with mild lateral angulation of the distal fracture fragment. 3. Multiple small fracture fragment along the lateral tibial plafond with mild widening of the lateral talotibial joint space. 4. Open wound with displaced fracture fragment along the  medial distal tibia. Electronically Signed   By: Anner Crete M.D.   On: 10/11/2017 22:04   Dg C-arm 1-60 Min  Addendum Date: 10/12/2017   ADDENDUM REPORT: 10/12/2017 02:11 ADDENDUM: Fluoro time 45 seconds. Electronically Signed   By: Kristine Garbe M.D.   On: 10/12/2017 02:11   Result Date: 10/12/2017 CLINICAL DATA:  67 y/o  M; right ankle open reduction and fixation. EXAM: DG C-ARM  61-120 MIN; RIGHT ANKLE - 2 VIEW COMPARISON:  10/11/2017 CT ankle FINDINGS: Two intraoperative fluoroscopic images demonstrated comminuted fracture deformities of the distal fibula and tibia with intra-articular extension. IMPRESSION: Comminuted distal fibula and tibia fractures with intra-articular extension. Electronically Signed: By: Kristine Garbe M.D. On: 10/12/2017 01:50    Disposition: 01-Home or Self Care       Signed: Dorise Hiss [facility] 10/12/2017, 8:20 AM

## 2017-10-12 NOTE — Transfer of Care (Signed)
Immediate Anesthesia Transfer of Care Note  Patient: Glen Colon  Procedure(s) Performed: I & D right ankle with application of external fixator and wound vac (Right Ankle)  Patient Location: PACU  Anesthesia Type:General  Level of Consciousness: awake  Airway & Oxygen Therapy: Patient Spontanous Breathing and Patient connected to face mask oxygen  Post-op Assessment: Report given to RN and Post -op Vital signs reviewed and stable  Post vital signs: Reviewed and stable  Last Vitals:  Vitals:   10/11/17 2153 10/11/17 2200  BP: 122/83 103/76  Pulse: 78 75  Resp: 16 15  Temp:    SpO2: 94% 95%    Last Pain:  Vitals:   10/11/17 2045  TempSrc:   PainSc: 10-Worst pain ever         Complications: No apparent anesthesia complications

## 2017-10-12 NOTE — Anesthesia Post-op Follow-up Note (Signed)
Anesthesia QCDR form completed.        

## 2017-10-12 NOTE — Progress Notes (Signed)
PT Cancellation Note  Patient Details [name] MRN: [id number] DOB: April 17, 1951   Cancelled Treatment:    Reason Eval/Treat Not Completed: Other (comment). Pt with plans for hospital transfer for trauma service. Will cancel current PT orders.   Mahala Rommel 10/12/2017, 9:56 AM  Greggory Stallion, PT, DPT [phone]

## 2017-10-12 NOTE — Progress Notes (Signed)
Pharmacy Antibiotic Note  Glen Colon is a 67 y.o. male admitted on 10/11/2017 with Ankle fracture (Grade 2 open fracture).  Pharmacy has been consulted for gentamicin dosing.  Plan: Patient received gentamicin 1.5 mg/kg IV x 1 (adjBW) and cefazolin 1g IV x 1 for surgical prophylaxis  Will start patient on gentamicin 7 mg/kg/day (570 mg IV daily per adjusted body weight) for the grade II open fracture. Will draw a gent trough prior to 4th dose on 01/19 @ 2100. Desired trough < 1 mcg/mL  Will trough sooner if renal function declines.  Height: 5\' 10"  (177.8 cm) Weight: 205 lb (93 kg) IBW/kg (Calculated) : 73  Temp (24hrs), Avg:97.9 F (36.6 C), Min:97.5 F (36.4 C), Max:98.5 F (36.9 C)  Recent Labs  Lab 10/11/17 2009  WBC 6.1  CREATININE 0.72    Estimated Creatinine Clearance: 106.9 mL/min (by C-G formula based on SCr of 0.72 mg/dL).    Allergies  Allergen Reactions  . Codeine     Vomiting, chills, "deathly ill"    Thank you for allowing pharmacy to be a part of this patient's care.  Tobie Lords, PharmD, BCPS Clinical Pharmacist 10/12/2017

## 2017-10-12 NOTE — Anesthesia Postprocedure Evaluation (Signed)
Anesthesia Post Note  Patient: Glen Colon  Procedure(s) Performed: I & D right ankle with application of external fixator and wound vac (Right Ankle)  Patient location during evaluation: PACU Anesthesia Type: General Level of consciousness: awake and alert Pain management: pain level controlled Vital Signs Assessment: post-procedure vital signs reviewed and stable Respiratory status: spontaneous breathing and respiratory function stable Cardiovascular status: stable Anesthetic complications: no     Last Vitals:  Vitals:   10/12/17 0140 10/12/17 0145  BP: (!) 159/90   Pulse: 85 87  Resp: 13 (!) 22  Temp:    SpO2: 99% 95%    Last Pain:  Vitals:   10/12/17 0145  TempSrc:   PainSc: 8                  KEPHART,WILLIAM K

## 2017-10-13 ENCOUNTER — Inpatient Hospital Stay (HOSPITAL_COMMUNITY): Payer: Medicaid Other

## 2017-10-13 ENCOUNTER — Inpatient Hospital Stay (HOSPITAL_COMMUNITY): Payer: Medicaid Other | Admitting: Certified Registered Nurse Anesthetist

## 2017-10-13 ENCOUNTER — Inpatient Hospital Stay (HOSPITAL_COMMUNITY): Admission: RE | Admit: 2017-10-13 | Payer: Medicaid Other | Source: Ambulatory Visit | Admitting: Orthopedic Surgery

## 2017-10-13 ENCOUNTER — Encounter (HOSPITAL_COMMUNITY): Payer: Self-pay | Admitting: Orthopedic Surgery

## 2017-10-13 ENCOUNTER — Encounter (HOSPITAL_COMMUNITY)
Admission: AD | Disposition: A | Discharge status: Home Health | Payer: Self-pay | Source: Ambulatory Visit | Attending: Orthopedic Surgery

## 2017-10-13 DIAGNOSIS — B182 Chronic viral hepatitis C: Secondary | ICD-10-CM

## 2017-10-13 DIAGNOSIS — K7460 Unspecified cirrhosis of liver: Secondary | ICD-10-CM

## 2017-10-13 DIAGNOSIS — F1210 Cannabis abuse, uncomplicated: Secondary | ICD-10-CM

## 2017-10-13 DIAGNOSIS — F1020 Alcohol dependence, uncomplicated: Secondary | ICD-10-CM

## 2017-10-13 HISTORY — DX: Cannabis abuse, uncomplicated: F12.10

## 2017-10-13 HISTORY — PX: EXTERNAL FIXATION LEG: SHX1549

## 2017-10-13 HISTORY — DX: Alcohol dependence, uncomplicated: F10.20

## 2017-10-13 HISTORY — DX: Unspecified cirrhosis of liver: K74.60

## 2017-10-13 HISTORY — PX: INTRAMEDULLARY (IM) NAIL FIBULA: SHX5874

## 2017-10-13 HISTORY — PX: I & D EXTREMITY: SHX5045

## 2017-10-13 HISTORY — DX: Chronic viral hepatitis C: B18.2

## 2017-10-13 LAB — COMPREHENSIVE METABOLIC PANEL
ALBUMIN: 2.5 g/dL — AB (ref 3.5–5.0)
ALK PHOS: 146 U/L — AB (ref 38–126)
ALT: 67 U/L — ABNORMAL HIGH (ref 17–63)
AST: 122 U/L — AB (ref 15–41)
Anion gap: 7 (ref 5–15)
BUN: 7 mg/dL (ref 6–20)
CALCIUM: 7.9 mg/dL — AB (ref 8.9–10.3)
CO2: 24 mmol/L (ref 22–32)
CREATININE: 0.77 mg/dL (ref 0.61–1.24)
Chloride: 101 mmol/L (ref 101–111)
GFR calc Af Amer: >60 mL/min (ref >60)
GLUCOSE: 115 mg/dL — AB (ref 65–99)
Potassium: 3.7 mmol/L (ref 3.5–5.1)
Sodium: 132 mmol/L — ABNORMAL LOW (ref 135–145)
Total Bilirubin: 4.3 mg/dL — ABNORMAL HIGH (ref 0.3–1.2)
Total Protein: 5.8 g/dL — ABNORMAL LOW (ref 6.5–8.1)

## 2017-10-13 LAB — CBC
HEMATOCRIT: 33.5 % — AB (ref 39.0–52.0)
HEMOGLOBIN: 11.6 g/dL — AB (ref 13.0–17.0)
MCH: 35.2 pg — ABNORMAL HIGH (ref 26.0–34.0)
MCHC: 34.6 g/dL (ref 30.0–36.0)
MCV: 101.5 fL — AB (ref 78.0–100.0)
Platelets: 42 10*3/uL — ABNORMAL LOW (ref 150–400)
RBC: 3.3 MIL/uL — ABNORMAL LOW (ref 4.22–5.81)
RDW: 15.6 % — ABNORMAL HIGH (ref 11.5–15.5)
WBC: 9 10*3/uL (ref 4.0–10.5)

## 2017-10-13 LAB — TYPE AND SCREEN
ABO/RH(D): O POS
Antibody Screen: NEGATIVE

## 2017-10-13 LAB — HIV ANTIBODY (ROUTINE TESTING W REFLEX): HIV Screen 4th Generation wRfx: NONREACTIVE

## 2017-10-13 LAB — PROTIME-INR
INR: 1.38
Prothrombin Time: 16.8 seconds — ABNORMAL HIGH (ref 11.4–15.2)

## 2017-10-13 LAB — SURGICAL PCR SCREEN
MRSA, PCR: NEGATIVE
Staphylococcus aureus: NEGATIVE

## 2017-10-13 LAB — APTT: APTT: 35 s (ref 24–36)

## 2017-10-13 LAB — OSMOLALITY: OSMOLALITY: 271 mosm/kg — AB (ref 275–295)

## 2017-10-13 LAB — ABO/RH: ABO/RH(D): O POS

## 2017-10-13 SURGERY — IRRIGATION AND DEBRIDEMENT EXTREMITY
Anesthesia: General | Site: Leg Lower | Laterality: Right

## 2017-10-13 MED ORDER — 0.9 % SODIUM CHLORIDE (POUR BTL) OPTIME
TOPICAL | Status: DC | PRN
  Administered 2017-10-13: 1000 mL

## 2017-10-13 MED ORDER — CEFAZOLIN SODIUM-DEXTROSE 1-4 GM/50ML-% IV SOLN
1.0000 g | Freq: Three times a day (TID) | INTRAVENOUS | Status: AC
  Administered 2017-10-13 – 2017-10-15 (×6): 1 g via INTRAVENOUS
  Filled 2017-10-13 (×6): qty 50

## 2017-10-13 MED ORDER — ALBUTEROL SULFATE (2.5 MG/3ML) 0.083% IN NEBU
2.5000 mg | INHALATION_SOLUTION | Freq: Once | RESPIRATORY_TRACT | Status: AC
  Administered 2017-10-13: 2.5 mg via RESPIRATORY_TRACT
  Filled 2017-10-13: qty 3

## 2017-10-13 MED ORDER — ALBUTEROL SULFATE HFA 108 (90 BASE) MCG/ACT IN AERS
INHALATION_SPRAY | RESPIRATORY_TRACT | Status: DC | PRN
  Administered 2017-10-13 (×2): 6 via RESPIRATORY_TRACT

## 2017-10-13 MED ORDER — ONDANSETRON HCL 4 MG/2ML IJ SOLN
INTRAMUSCULAR | Status: DC | PRN
  Administered 2017-10-13: 4 mg via INTRAVENOUS

## 2017-10-13 MED ORDER — LACTATED RINGERS IV SOLN
INTRAVENOUS | Status: DC
  Administered 2017-10-13: 10:00:00 via INTRAVENOUS

## 2017-10-13 MED ORDER — MIDAZOLAM HCL 2 MG/2ML IJ SOLN
INTRAMUSCULAR | Status: DC | PRN
  Administered 2017-10-13: 2 mg via INTRAVENOUS

## 2017-10-13 MED ORDER — LIDOCAINE 2% (20 MG/ML) 5 ML SYRINGE
INTRAMUSCULAR | Status: DC | PRN
  Administered 2017-10-13: 100 mg via INTRAVENOUS

## 2017-10-13 MED ORDER — FENTANYL CITRATE (PF) 250 MCG/5ML IJ SOLN
INTRAMUSCULAR | Status: DC | PRN
  Administered 2017-10-13: 150 ug via INTRAVENOUS
  Administered 2017-10-13: 100 ug via INTRAVENOUS
  Administered 2017-10-13: 50 ug via INTRAVENOUS

## 2017-10-13 MED ORDER — ROCURONIUM BROMIDE 10 MG/ML (PF) SYRINGE
PREFILLED_SYRINGE | INTRAVENOUS | Status: DC | PRN
  Administered 2017-10-13: 20 mg via INTRAVENOUS
  Administered 2017-10-13 (×2): 10 mg via INTRAVENOUS
  Administered 2017-10-13: 30 mg via INTRAVENOUS
  Administered 2017-10-13: 20 mg via INTRAVENOUS

## 2017-10-13 MED ORDER — PROPOFOL 10 MG/ML IV BOLUS
INTRAVENOUS | Status: DC | PRN
  Administered 2017-10-13: 200 mg via INTRAVENOUS

## 2017-10-13 MED ORDER — SODIUM CHLORIDE 0.9 % IR SOLN
Status: DC | PRN
  Administered 2017-10-13 (×2): 3000 mL

## 2017-10-13 MED ORDER — LACTATED RINGERS IV SOLN
INTRAVENOUS | Status: DC | PRN
  Administered 2017-10-13 (×2): via INTRAVENOUS

## 2017-10-13 MED ORDER — SUCCINYLCHOLINE CHLORIDE 200 MG/10ML IV SOSY
PREFILLED_SYRINGE | INTRAVENOUS | Status: DC | PRN
  Administered 2017-10-13: 140 mg via INTRAVENOUS

## 2017-10-13 MED ORDER — SUGAMMADEX SODIUM 200 MG/2ML IV SOLN
INTRAVENOUS | Status: DC | PRN
  Administered 2017-10-13: 200 mg via INTRAVENOUS

## 2017-10-13 SURGICAL SUPPLY — 87 items
BANDAGE ACE 3X5.8 VEL STRL LF (GAUZE/BANDAGES/DRESSINGS) ×3 IMPLANT
BANDAGE ACE 4X5 VEL STRL LF (GAUZE/BANDAGES/DRESSINGS) ×3 IMPLANT
BANDAGE ACE 6X5 VEL STRL LF (GAUZE/BANDAGES/DRESSINGS) IMPLANT
BANDAGE ESMARK 6X9 LF (GAUZE/BANDAGES/DRESSINGS) ×1 IMPLANT
BIT DRILL 2.5 CANN ENDOSCOPIC (BIT) ×3 IMPLANT
BLADE SURG 10 STRL SS (BLADE) ×6 IMPLANT
BNDG COHESIVE 4X5 TAN STRL (GAUZE/BANDAGES/DRESSINGS) IMPLANT
BNDG COHESIVE 6X5 TAN STRL LF (GAUZE/BANDAGES/DRESSINGS) IMPLANT
BNDG ESMARK 6X9 LF (GAUZE/BANDAGES/DRESSINGS) ×3
BNDG GAUZE ELAST 4 BULKY (GAUZE/BANDAGES/DRESSINGS) ×6 IMPLANT
BNDG GAUZE STRTCH 6 (GAUZE/BANDAGES/DRESSINGS) ×9 IMPLANT
BRUSH SCRUB SURG 4.25 DISP (MISCELLANEOUS) ×9 IMPLANT
CANISTER WOUND CARE 500ML ATS (WOUND CARE) ×3 IMPLANT
CLAMP COMBI 8.0/11.0 LRG/MED (Clamp) ×6 IMPLANT
CLAMP COMBO MED CLIP-ON SELF (Clamp) ×6 IMPLANT
CLOSURE WOUND 1/2 X4 (GAUZE/BANDAGES/DRESSINGS)
COVER MAYO STAND STRL (DRAPES) ×3 IMPLANT
COVER SURGICAL LIGHT HANDLE (MISCELLANEOUS) ×3 IMPLANT
CUFF TOURNIQUET SINGLE 18IN (TOURNIQUET CUFF) IMPLANT
DRAPE C-ARM 42X72 X-RAY (DRAPES) ×3 IMPLANT
DRAPE C-ARMOR (DRAPES) ×3 IMPLANT
DRAPE HALF SHEET 40X57 (DRAPES) ×6 IMPLANT
DRAPE INCISE IOBAN 66X45 STRL (DRAPES) ×3 IMPLANT
DRAPE U-SHAPE 47X51 STRL (DRAPES) ×3 IMPLANT
DRSG ADAPTIC 3X8 NADH LF (GAUZE/BANDAGES/DRESSINGS) IMPLANT
DRSG MEPITEL 4X7.2 (GAUZE/BANDAGES/DRESSINGS) ×3 IMPLANT
DRSG PAD ABDOMINAL 8X10 ST (GAUZE/BANDAGES/DRESSINGS) ×6 IMPLANT
DRSG VAC ATS SM SENSATRAC (GAUZE/BANDAGES/DRESSINGS) ×3 IMPLANT
ELECT CAUTERY BLADE 6.4 (BLADE) ×3 IMPLANT
ELECT REM PT RETURN 9FT ADLT (ELECTROSURGICAL) ×3
ELECTRODE REM PT RTRN 9FT ADLT (ELECTROSURGICAL) ×1 IMPLANT
EVACUATOR 1/8 PVC DRAIN (DRAIN) IMPLANT
FIBULOCK IMPLANT SYSTEM STER (Miscellaneous) ×3 IMPLANT
GAUZE SPONGE 4X4 12PLY STRL (GAUZE/BANDAGES/DRESSINGS) IMPLANT
GLOVE BIO SURGEON STRL SZ7.5 (GLOVE) ×3 IMPLANT
GLOVE BIO SURGEON STRL SZ8 (GLOVE) ×9 IMPLANT
GLOVE BIO SURGEON STRL SZ8.5 (GLOVE) ×3 IMPLANT
GLOVE BIOGEL PI IND STRL 7.5 (GLOVE) ×1 IMPLANT
GLOVE BIOGEL PI IND STRL 8 (GLOVE) ×1 IMPLANT
GLOVE BIOGEL PI INDICATOR 7.5 (GLOVE) ×2
GLOVE BIOGEL PI INDICATOR 8 (GLOVE) ×2
GOWN STRL REUS W/ TWL LRG LVL3 (GOWN DISPOSABLE) ×3 IMPLANT
GOWN STRL REUS W/ TWL XL LVL3 (GOWN DISPOSABLE) ×2 IMPLANT
GOWN STRL REUS W/TWL LRG LVL3 (GOWN DISPOSABLE) ×6
GOWN STRL REUS W/TWL XL LVL3 (GOWN DISPOSABLE) ×4
HANDPIECE INTERPULSE COAX TIP (DISPOSABLE) ×2
KIT BASIN OR (CUSTOM PROCEDURE TRAY) ×3 IMPLANT
KIT ROOM TURNOVER OR (KITS) ×3 IMPLANT
MANIFOLD NEPTUNE II (INSTRUMENTS) ×3 IMPLANT
NAIL FIBULA 3.0X180 RIGHT (Nail) ×3 IMPLANT
NEEDLE 22X1 1/2 (OR ONLY) (NEEDLE) IMPLANT
NS IRRIG 1000ML POUR BTL (IV SOLUTION) ×3 IMPLANT
PACK ORTHO EXTREMITY (CUSTOM PROCEDURE TRAY) ×3 IMPLANT
PAD ARMBOARD 7.5X6 YLW CONV (MISCELLANEOUS) ×6 IMPLANT
PAD CAST 3X4 CTTN HI CHSV (CAST SUPPLIES) ×1 IMPLANT
PADDING CAST COTTON 3X4 STRL (CAST SUPPLIES) ×2
PADDING CAST COTTON 6X4 STRL (CAST SUPPLIES) ×6 IMPLANT
REAMER CANN LONG 3.2 STER (MISCELLANEOUS) ×3 IMPLANT
ROD CARBON FIBER 8.0X160MM (Rod) ×6 IMPLANT
SCREW BN T10 FT 20X2.7XST CORT (Screw) ×1 IMPLANT
SCREW CORT 2.7X20 (Screw) ×2 IMPLANT
SCREW CORTEX LP TM SS 2.7X16 (Screw) ×3 IMPLANT
SCREW LO PRO 2.7X22MM CORTEX (Screw) ×3 IMPLANT
SCREW SHANZ 4.0X100 (Screw) ×6 IMPLANT
SET HNDPC FAN SPRY TIP SCT (DISPOSABLE) ×1 IMPLANT
SPONGE LAP 18X18 X RAY DECT (DISPOSABLE) ×6 IMPLANT
STAPLER VISISTAT 35W (STAPLE) ×3 IMPLANT
STOCKINETTE IMPERVIOUS 9X36 MD (GAUZE/BANDAGES/DRESSINGS) ×3 IMPLANT
STOCKINETTE IMPERVIOUS LG (DRAPES) IMPLANT
STRIP CLOSURE SKIN 1/2X4 (GAUZE/BANDAGES/DRESSINGS) IMPLANT
SUT ETHILON 2 0 FS 18 (SUTURE) ×9 IMPLANT
SUT ETHILON 3 0 PS 1 (SUTURE) ×6 IMPLANT
SUT PDS AB 0 CT 36 (SUTURE) ×3 IMPLANT
SUT PDS AB 2-0 CT1 27 (SUTURE) ×3 IMPLANT
SUT VIC AB 0 CT1 27 (SUTURE)
SUT VIC AB 0 CT1 27XBRD ANBCTR (SUTURE) IMPLANT
SUT VIC AB 2-0 CT1 27 (SUTURE)
SUT VIC AB 2-0 CT1 TAPERPNT 27 (SUTURE) IMPLANT
SWAB CULTURE ESWAB REG 1ML (MISCELLANEOUS) IMPLANT
SYSTEM IMPLANT FIBULOCK STRL (Miscellaneous) ×1 IMPLANT
TOWEL OR 17X24 6PK STRL BLUE (TOWEL DISPOSABLE) ×6 IMPLANT
TOWEL OR 17X26 10 PK STRL BLUE (TOWEL DISPOSABLE) ×6 IMPLANT
TUBE CONNECTING 12'X1/4 (SUCTIONS) ×1
TUBE CONNECTING 12X1/4 (SUCTIONS) ×2 IMPLANT
UNDERPAD 30X30 (UNDERPADS AND DIAPERS) ×6 IMPLANT
WATER STERILE IRR 1000ML POUR (IV SOLUTION) IMPLANT
YANKAUER SUCT BULB TIP NO VENT (SUCTIONS) ×3 IMPLANT

## 2017-10-13 NOTE — Anesthesia Procedure Notes (Addendum)
Procedure Name: Intubation Date/Time: 10/13/2017 11:00 AM Performed by: Bryson Corona, CRNA Pre-anesthesia Checklist: Patient identified, Emergency Drugs available, Suction available and Patient being monitored Patient Re-evaluated:Patient Re-evaluated prior to induction Oxygen Delivery Method: Circle System Utilized Preoxygenation: Pre-oxygenation with 100% oxygen Induction Type: IV induction Ventilation: Oral airway inserted - appropriate to patient size Laryngoscope Size: Glidescope and 4 Grade View: Grade I Tube type: Oral Tube size: 7.0 mm Number of attempts: 1 Airway Equipment and Method: Stylet and Oral airway Placement Confirmation: ETT inserted through vocal cords under direct vision,  positive ETCO2 and breath sounds checked- equal and bilateral Secured at: 22 cm Tube secured with: Tape Dental Injury: Teeth and Oropharynx as per pre-operative assessment  Comments: Unable to obtain view with Sabra Heck 2 due to large amounts of thick, yellow secretions, small glottic opening, and extremely dry tongue. Grade 1 view with Glidescope.

## 2017-10-13 NOTE — Transfer of Care (Signed)
Immediate Anesthesia Transfer of Care Note  Patient: Glen Colon  Procedure(s) Performed: REPEAT IRRIGATION AND DEBRIDEMENT EXTREMITY WITH ADJUSTMENT OF EXTERNAL FIXITOR (Right Leg Lower) EXTERNAL FIXATION LEG (Right Leg Lower) INTRAMEDULLARY (IM) NAIL FIBULA (Right Leg Lower)  Patient Location: PACU  Anesthesia Type:General  Level of Consciousness: awake, alert  and oriented  Airway & Oxygen Therapy: Patient Spontanous Breathing and Patient connected to nasal cannula oxygen  Post-op Assessment: Report given to RN and Post -op Vital signs reviewed and stable  Post vital signs: Reviewed and stable  Last Vitals:  Vitals:   10/13/17 0459 10/13/17 1422  BP: 132/70   Pulse: 100   Resp: 17   Temp: 36.9 C (P) 36.7 C  SpO2: 100%     Last Pain:  Vitals:   10/13/17 1422  TempSrc:   PainSc: (P) 0-No pain      Patients Stated Pain Goal: 3 (08/17/61 4469)  Complications: No apparent anesthesia complications

## 2017-10-13 NOTE — H&P (Signed)
Orthopaedic Trauma Service (OTS) Consult/H&P  Patient ID: Glen Colon MRN: [id number] DOB/AGE: Feb 21, 1951 67 y.o.   Reason for Consult: Open R pilon fracture Referring Physician: Joni Reining, MD (ortho at [facility])   HPI: Glen Colon is an 67 y.o. white male who was admitted to [facility] on 10/11/2017 after he sustained a fall off of a porch approximately 3 feet.  Patient was intoxicated and sustained a fall.  His right ankle twisted outwardly resulting in an open fracture with a medial wound over his right ankle.  Patient was brought to [facility] with isolated orthopedic injuries.  He was seen and evaluated by the orthopedic service at [facility] and taken emergently to the operating room for irrigation debridement along with application of a delta frame fixator for his ankle.  Due to the complexity of the injury the orthopedic trauma service was consulted for transfer and definitive management as it was felt that his injury would best be treated by orthopedic trauma fellowship trained surgeon.  Patient was transferred to [facility] and arrived around 8 PM last night.  Patient seen and evaluated by the orthopedic trauma service this morning.  Patient's transfer was initiated at 8 AM on 10/12/2017 of note as well.  We did follow the patient throughout the day and ensure that his admission labs were available on arrival to [facility].  We also started him on withdrawal protocol based off of his history as well as admission blood alcohol level.  Patient complains only of pain in his right ankle.  Denies any new numbness or tingling no additional injuries noted elsewhere other than some mild right-sided chest wall tenderness from where he hit his ribs.   Patient is on disability and has been so for approximately 5 years.  He states that he had a left-sided lobectomy of his left long back in 6759 complications related to pneumonia.  He also had liver cyst which was  removed as well.  Patient does smoke a pack to pack and half a day.  He does smoke marijuana as well which he states is for his glaucoma  He is on phenobarbital for his epilepsy which he has had since age of 67  He has an extensive alcohol use history.  Initially his beverage of choice was [employer] but after his liver cyst was removed he decided to stick to beer.  He drinks approximately 12 cans of beer a day  States that he is never had withdrawal symptoms   This patient does live by himself in a house.  He was independent prior to admission no ambulatory devices.  He does drive.  Lives with his dogs in a single-story house   Sister is an Therapist, sports by his report at [facility] as well   Patient also reports history of open left ankle fracture approximately 40 years ago treated with casting   Supposedly history of hepatitis C which the patient does not report.  Care everywhere does show notes from [place] indicating the patient has a chronic hepatitis C with associated liver cirrhosis.  It appears that there was extensive workup completed by the GI service at [facility].  His last visit was May 2015.  Note indicated that they wanted to start the patient on antiretrovirals however because he was still actively drinking this was not completed.  Patient became angry because of this.  Referral was made for second opinion at [facility] but I do not see  any notes indicating that the patient went.  No additional follow-up noted in the computer   [facility] drug reporting database was reviewed.  The patient does get regular prescriptions for phenobarbital as well as alprazolam every month  Past Medical History:  Diagnosis Date  . Epilepsy ([place])   . Pneumonia   . Tobacco abuse     Past Surgical History:  Procedure Laterality Date  . LIVER CYST REMOVAL    . [place], "walking pna"  . ORIF ANKLE FRACTURE Right 10/11/2017   Procedure: I & D right ankle  with application of external fixator and wound vac;  Surgeon: Hessie Knows, MD;  Location: [facility] ORS;  Service: Orthopedics;  Laterality: Right;    No family history on file.  Social History:  reports that he has been smoking cigarettes.  He has a 82.00 pack-year smoking history. he has never used smokeless tobacco. He reports that he drinks alcohol. His drug history is not on file.  Allergies:  Allergies  Allergen Reactions  . Codeine Nausea And Vomiting and Other (See Comments)    CHILLS & SWEATS "DEATHLY ILL" per Pt description    Medications: I have reviewed the patient's current medications.  Home meds  Phenobarbital 144m po at bedtime  Alprazolam 182mbid prn anxiety   Results for orders placed or performed during the hospital encounter of 10/11/17 (from the past 48 hour(s))  CBC     Status: Abnormal   Collection Time: 10/11/17  8:09 PM  Result Value Ref Range   WBC 6.1 3.8 - 10.6 K/uL   RBC 4.11 (L) 4.40 - 5.90 MIL/uL   Hemoglobin 14.6 13.0 - 18.0 g/dL   HCT 41.7 40.0 - 52.0 %   MCV 101.6 (H) 80.0 - 100.0 fL   MCH 35.6 (H) 26.0 - 34.0 pg   MCHC 35.0 32.0 - 36.0 g/dL   RDW 15.6 (H) 11.5 - 14.5 %   Platelets 53 (L) 150 - 440 K/uL    Comment: Performed at [facility] [id number] metabolic panel     Status: Abnormal   Collection Time: 10/11/17  8:09 PM  Result Value Ref Range   Sodium 131 (L) 135 - 145 mmol/L   Potassium 3.9 3.5 - 5.1 mmol/L    Comment: HEMOLYSIS AT THIS LEVEL MAY AFFECT RESULT   Chloride 98 (L) 101 - 111 mmol/L   CO2 23 22 - 32 mmol/L   Glucose, Bld 101 (H) 65 - 99 mg/dL   BUN 10 6 - 20 mg/dL   Creatinine, Ser 0.72 0.61 - 1.24 mg/dL   Calcium 8.3 (L) 8.9 - 10.3 mg/dL   GFR calc non Af Amer >60 >60 mL/min   GFR calc Af Amer >60 >60 mL/min    Comment: (NOTE) The eGFR has been calculated using the CKD EPI equation. This calculation has not been validated in all clinical situations. eGFR's  persistently <60 mL/min signify possible Chronic Kidney Disease.    Anion gap 10 5 - 15    Comment: Performed at [facility] [address]Ethanol     Status: Abnormal   Collection Time: 10/11/17  8:09 PM  Result Value Ref Range   Alcohol, Ethyl (B) 179 (H) <10 mg/dL    Comment:        LOWEST DETECTABLE LIMIT FOR SERUM ALCOHOL IS 10 mg/dL FOR MEDICAL PURPOSES ONLY Performed at  [facility], [address], [place], [place] [address]   Urine Drug Screen, Qualitative [facility] only)     Status: Abnormal   Collection Time: 10/11/17 10:27 PM  Result Value Ref Range   Tricyclic, Ur Screen NONE DETECTED NONE DETECTED   Amphetamines, Ur Screen NONE DETECTED NONE DETECTED   MDMA (Ecstasy)Ur Screen NONE DETECTED NONE DETECTED   Cocaine Metabolite,Ur [place] NONE DETECTED NONE DETECTED   Opiate, Ur Screen POSITIVE (A) NONE DETECTED   Phencyclidine (PCP) Ur S NONE DETECTED NONE DETECTED   Cannabinoid 50 Ng, Ur [place] POSITIVE (A) NONE DETECTED   Barbiturates, Ur Screen POSITIVE (A) NONE DETECTED   Benzodiazepine, Ur Scrn POSITIVE (A) NONE DETECTED   Methadone Scn, Ur NONE DETECTED NONE DETECTED    Comment: (NOTE) Tricyclics + metabolites, urine    Cutoff 1000 ng/mL Amphetamines + metabolites, urine  Cutoff 1000 ng/mL MDMA (Ecstasy), urine              Cutoff 500 ng/mL Cocaine Metabolite, urine          Cutoff 300 ng/mL Opiate + metabolites, urine        Cutoff 300 ng/mL Phencyclidine (PCP), urine         Cutoff 25 ng/mL Cannabinoid, urine                 Cutoff 50 ng/mL Barbiturates + metabolites, urine  Cutoff 200 ng/mL Benzodiazepine, urine              Cutoff 200 ng/mL Methadone, urine                   Cutoff 300 ng/mL The urine drug screen provides only a preliminary, unconfirmed analytical test result and should not be used for non-medical purposes. Clinical consideration and professional judgment should be applied to any positive drug  screen result due to possible interfering substances. A more specific alternate chemical method must be used in order to obtain a confirmed analytical result. Gas chromatography / mass spectrometry (GC/MS) is the preferred confirmat ory method. Performed at [facility], [address] [place], [place] [address]   Comprehensive metabolic panel     Status: Abnormal   Collection Time: 10/12/17  5:23 AM  Result Value Ref Range   Sodium 132 (L) 135 - 145 mmol/L   Potassium 3.6 3.5 - 5.1 mmol/L   Chloride 101 101 - 111 mmol/L   CO2 22 22 - 32 mmol/L   Glucose, Bld 111 (H) 65 - 99 mg/dL   BUN 11 6 - 20 mg/dL   Creatinine, Ser 0.72 0.61 - 1.24 mg/dL   Calcium 7.8 (L) 8.9 - 10.3 mg/dL   Total Protein 6.1 (L) 6.5 - 8.1 g/dL   Albumin 2.7 (L) 3.5 - 5.0 g/dL   AST 127 (H) 15 - 41 U/L   ALT 77 (H) 17 - 63 U/L   Alkaline Phosphatase 185 (H) 38 - 126 U/L   Total Bilirubin 2.7 (H) 0.3 - 1.2 mg/dL   GFR calc non Af Amer >60 >60 mL/min   GFR calc Af Amer >60 >60 mL/min    Comment: (NOTE) The eGFR has been calculated using the CKD EPI equation. This calculation has not been validated in all clinical situations. eGFR's persistently <60 mL/min signify possible Chronic Kidney Disease.    Anion gap 9 5 - 15    Comment: Performed at [facility], [place], [place] [address]  HIV antibody (Routine Testing)  Status: None   Collection Time: 10/12/17  8:32 AM  Result Value Ref Range   HIV Screen 4th Generation wRfx Non Reactive Non Reactive    Comment: (NOTE) Performed At: [facility] [place], [place] [id number] Rush Farmer MD [id number] Performed at [facility], [address] [place], [place] [address]     Dg Tibia/fibula Right  Result Date: 10/11/2017 CLINICAL DATA:  Fall injury to the ankle EXAM: RIGHT TIBIA AND FIBULA - 2 VIEW COMPARISON:  None. FINDINGS: Mild degenerative changes involving the medial  compartment of the knee. Acute severely comminuted fracture involving distal shaft of the fibula with 19 mm horizontally oriented displaced shaft fracture fragment. Moderate lateral angulation of distal fracture fragment. Additional fracture through the distal metaphysis of the tibia with less than 1/4 bone with of lateral displacement of distal fracture fragment. Moderate lateral angulation of distal tibial fracture fragment. Soft tissue gas lateral aspect of the right ankle. IMPRESSION: Severely comminuted and displaced distal fibular fracture with moderate angulation. Associated mildly displaced and angulated distal tibial fracture Electronically Signed   By: Donavan Foil M.D.   On: 10/11/2017 20:43   Dg Ankle 2 Views Right  Addendum Date: 10/12/2017   ADDENDUM REPORT: 10/12/2017 02:11 ADDENDUM: Fluoro time 45 seconds. Electronically Signed   By: Kristine Garbe M.D.   On: 10/12/2017 02:11   Result Date: 10/12/2017 CLINICAL DATA:  67 y/o  M; right ankle open reduction and fixation. EXAM: DG C-ARM 61-120 MIN; RIGHT ANKLE - 2 VIEW COMPARISON:  10/11/2017 CT ankle FINDINGS: Two intraoperative fluoroscopic images demonstrated comminuted fracture deformities of the distal fibula and tibia with intra-articular extension. IMPRESSION: Comminuted distal fibula and tibia fractures with intra-articular extension. Electronically Signed: By: Kristine Garbe M.D. On: 10/12/2017 01:50   Dg Ankle Complete Right  Result Date: 10/12/2017 CLINICAL DATA:  Preop ankle fracture EXAM: RIGHT ANKLE - COMPLETE 3+ VIEW COMPARISON:  10/11/2017 radiograph and CT FINDINGS: External fixation device in place with rod through the calcaneus. Severely comminuted distal fibular fracture with multiple displaced bone fragments. Comminuted distal tibial fracture with decreased angular deformity compared to previous. Posterior malleolar fracture is noted. Mild anterior positioning of the talar dome with respect to the  distal tibia. IMPRESSION: External fixation of the ankle. Comminuted, displaced fracture of the distal fibula. Comminuted, mildly displaced distal tibial fracture with decreased angulation. Posterior malleolar fracture noted. Mild anterior positioning of the talar dome with respect to the distal tibia on lateral view. Electronically Signed   By: Donavan Foil M.D.   On: 10/12/2017 23:33   Ct Ankle Right Wo Contrast  Result Date: 10/11/2017 CLINICAL DATA:  67 year old male with fall and fracture of the right ankle. EXAM: CT OF THE RIGHT ANKLE WITHOUT CONTRAST TECHNIQUE: Multidetector CT imaging of the right ankle was performed according to the standard protocol. Multiplanar CT image reconstructions were also generated. COMPARISON:  Radiograph dated 10/11/2017 FINDINGS: Bones/Joint/Cartilage There is a severely comminuted and impacted fracture of the distal tibia with extension into the articular surface of the tibial plafond. The fracture has heart is onto all and vertical components. There is approximately 6 mm lateral displacement of the lateral distal fracture fragment in relation to the tibial diaphysis. There is extension of a displaced fracture fragment through the skin along the medial aspect of the distal tibial metadiaphysis. Severely comminuted fracture of the distal fibular diaphysis with mild lateral angulation of the main distal fracture fragment. Multiple small fracture fragments noted along the lateral surface  of the tibial plafond. There is slight widening of the lateral talotibial joint space without significant dislocation. Small pockets of air noted in the ankle joint. Ligaments Suboptimally assessed by CT. Muscles and Tendons No large intramuscular fluid collection or hematoma. Soft tissues Open wound along the medial aspect of the distal tibial metadiaphysis with small fracture fragments. Diffuse pockets of air in the superficial and deep fascia. IMPRESSION: 1. Severely comminuted and  intra-articular open fracture of the distal tibia. 2. Comminuted fractures of the distal fibular diaphysis with mild lateral angulation of the distal fracture fragment. 3. Multiple small fracture fragment along the lateral tibial plafond with mild widening of the lateral talotibial joint space. 4. Open wound with displaced fracture fragment along the medial distal tibia. Electronically Signed   By: Anner Crete M.D.   On: 10/11/2017 22:04   Dg Chest Port 1 View  Result Date: 10/12/2017 CLINICAL DATA:  Preop ankle fracture EXAM: PORTABLE CHEST 1 VIEW COMPARISON:  Chest x-ray 01/11/2013 FINDINGS: Non inclusion of the CP angles. Minimal atelectasis at the left base. No acute consolidation. Borderline cardiomegaly. No pneumothorax. IMPRESSION: No active disease.  Non inclusion of the CP angles. Electronically Signed   By: Donavan Foil M.D.   On: 10/12/2017 23:30   Dg C-arm 1-60 Min  Addendum Date: 10/12/2017   ADDENDUM REPORT: 10/12/2017 02:11 ADDENDUM: Fluoro time 45 seconds. Electronically Signed   By: Kristine Garbe M.D.   On: 10/12/2017 02:11   Result Date: 10/12/2017 CLINICAL DATA:  67 y/o  M; right ankle open reduction and fixation. EXAM: DG C-ARM 61-120 MIN; RIGHT ANKLE - 2 VIEW COMPARISON:  10/11/2017 CT ankle FINDINGS: Two intraoperative fluoroscopic images demonstrated comminuted fracture deformities of the distal fibula and tibia with intra-articular extension. IMPRESSION: Comminuted distal fibula and tibia fractures with intra-articular extension. Electronically Signed: By: Kristine Garbe M.D. On: 10/12/2017 01:50    Review of Systems  Constitutional: Negative for chills and fever.  Respiratory: Negative for shortness of breath and wheezing.   Cardiovascular: Negative for chest pain and palpitations.  Gastrointestinal: Negative for abdominal pain, nausea and vomiting.  Musculoskeletal: Positive for joint pain (R ankle pain ).  Neurological: Negative for tingling.    Blood pressure 132/70, pulse 100, temperature 98.5 F (36.9 C), temperature source Oral, resp. rate 17, SpO2 100 %. Physical Exam  Constitutional: He is oriented to person, place, and time. He is cooperative. No distress.  Older appearing white male No acute distress Hard to understand at times-not sure if this is due to poor dentition.   HENT:  Mouth/Throat: Abnormal dentition.  Cardiovascular: Normal rate, regular rhythm, S1 normal and S2 normal.  Pulmonary/Chest: No accessory muscle usage. No respiratory distress.  Clear anterior fields  Abdominal:  Protuberant, soft and nontender + bowel sounds  Musculoskeletal:  Pelvis  no traumatic wounds or rash, no ecchymosis, stable to manual stress, nontender  Right lower extremity Inspection: Delta frame external fixator to the right ankle.  2 tibial half pins with warm trans-calcaneal pin Ace wrap over ankle Wound VAC is in place and functioning No deformity noted to the knee or hip Bony eval: Tenderness with palpation along the right distal tibia and fibula Right foot is nontender Right knee and right hip are nontender No tenderness to the right thigh Soft tissue: Swelling is pretty well controlled to the right ankle and foot Did not remove Ace wrap or wound VAC as we are returning to the OR later this morning for repeat irrigation debridement ROM:  Patient can perform gentle knee flexion extension Ankle motion restricted due to external fixator Sensation: DPN, SPN, TN sensory functions are grossly intact Motor: EHL, FHL, lesser toe motor functions are intact Vascular: + DP pulse Extremity is cool but symmetric to the contralateral side Compartments are soft, no pain with passive stretching  Left lower extremity No acute findings noted Motor and sensory functions are grossly intact Extremities cool but palpable dorsalis pedis pulse noted No open wounds, no deformities No crepitus with motion or movement, no blocks to  motion No instability appreciated Patient can flex and extend his hip, knee and ankle Healed traumatic wound medial left ankle is noted + DP pulse   Bilateral upper extremities shoulder, elbow, wrist, digits- no skin wounds, nontender, no instability, no blocks to motion  Sens  Ax/R/M/U intact  Mot   Ax/ R/ PIN/ M/ AIN/ U intact  Rad 2+    Neurological: He is alert and oriented to person, place, and time.  Psychiatric:  Patient does not appear to be withdrawing  at this time     Assessment/Plan:  67 year old white male fall off of a porch with a open right tibial plafond fracture and right distal fibula fracture, transferred from [facility]  - Fall off porch   -Open right tibial plafond fracture and right fibula fracture s/p irrigation and debridement and application of delta frame fixator  Return to the OR today for repeat irrigation debridement external fixator adjustment/revision  Would anticipate addition of metatarsal pins as the patient will likely require delayed fixation of his fracture given traumatic wound medially.  We will repeat CT scan after fixator is revised as his previous CT scan was done before surgical intervention at [place].  Patient may be treated definitively in the external fixator given his extensive medical comorbidities   Patient will be nonweightbearing for further notice   We did discuss at length his social habits have a negatively impact his healing potential.  Given his substance abuse history including nicotine, marijuana and extensive alcohol use he is at risk for complications including but not limited to nonunion, deep infection, implant failure.  Should patient develop a deep infection and may be limb threatening.  We did discuss this as well.   Patient will remain on IV antibiotics for his open fracture.  We will continue these for closure.  He is currently on Ancef  - Pain management:  This may prove challenging given his substance abuse  history  - ABL anemia/Hemodynamics  Morning labs are pending  - Medical issues   Epilepsy-Home medications   History of hepatitis C, transaminitis-chronic   HIV test is negative    Hyponatremia   Repeat labs are pending including serum osmolality   Chronic alcohol abuse   Withdrawal protocol   Vitamin and mineral supplementation  - DVT/PE prophylaxis:  Resume Lovenox postoperatively - ID:   Ancef 1 g IV every 8 hours  - Metabolic Bone Disease:  Metabolic bone labs have been ordered in the setting of a fairly low energy fracture along with polysubstance abuse  Suspect his nutrition is very poor as he drinks a substantial amount  - Activity:  Nonweightbearing right leg  - FEN/GI prophylaxis/Foley/Lines:  Npo  ivf     - Impediments to fracture healing:  Numerous: Alcohol abuse, nicotine dependence, marijuana abuse  - Dispo:  OR today for repeat irrigation debridement of right distal tibia as well as external fixator revision/adjustment   Jari Pigg, PA-C [facility] [phone] (  P) [phone] (C) [phone] (O) 10/13/2017, 8:52 AM

## 2017-10-13 NOTE — Anesthesia Postprocedure Evaluation (Signed)
Anesthesia Post Note  Patient: Glen Colon  Procedure(s) Performed: REPEAT IRRIGATION AND DEBRIDEMENT EXTREMITY WITH ADJUSTMENT OF EXTERNAL FIXITOR (Right Leg Lower) EXTERNAL FIXATION LEG (Right Leg Lower) INTRAMEDULLARY (IM) NAIL FIBULA (Right Leg Lower)     Patient location during evaluation: PACU Anesthesia Type: General Level of consciousness: awake Pain management: pain level controlled Vital Signs Assessment: post-procedure vital signs reviewed and stable Cardiovascular status: stable Anesthetic complications: no    Last Vitals:  Vitals:   10/13/17 0459 10/13/17 1422  BP: 132/70 (!) 147/80  Pulse: 100 (!) 112  Resp: 17 17  Temp: 36.9 C 36.7 C  SpO2: 100% 93%    Last Pain:  Vitals:   10/13/17 1422  TempSrc:   PainSc: 0-No pain        RLE Motor Response: Responds to commands (10/13/17 1422) RLE Sensation: Pain (10/13/17 1422)      Ani Deoliveira

## 2017-10-13 NOTE — Anesthesia Preprocedure Evaluation (Signed)
Anesthesia Evaluation  Patient identified by MRN, date of birth, ID band Patient awake    Reviewed: Allergy & Precautions, NPO status , Patient's Chart, lab work & pertinent test results  Airway Mallampati: II  TM Distance: >3 FB     Dental   Pulmonary pneumonia, Current Smoker,    breath sounds clear to auscultation       Cardiovascular negative cardio ROS   Rhythm:Regular Rate:Normal     Neuro/Psych    GI/Hepatic negative GI ROS, (+) Hepatitis -  Endo/Other    Renal/GU      Musculoskeletal   Abdominal   Peds  Hematology   Anesthesia Other Findings   Reproductive/Obstetrics                             Anesthesia Physical Anesthesia Plan  ASA: III  Anesthesia Plan: General   Post-op Pain Management:    Induction: Intravenous  PONV Risk Score and Plan: Treatment may vary due to age or medical condition  Airway Management Planned:   Additional Equipment:   Intra-op Plan:   Post-operative Plan: Extubation in OR  Informed Consent: I have reviewed the patients History and Physical, chart, labs and discussed the procedure including the risks, benefits and alternatives for the proposed anesthesia with the patient or authorized representative who has indicated his/her understanding and acceptance.   Dental advisory given  Plan Discussed with: CRNA and Anesthesiologist  Anesthesia Plan Comments:         Anesthesia Quick Evaluation

## 2017-10-13 NOTE — Brief Op Note (Addendum)
10/13/2017  2:45 PM  PATIENT:  Glen Colon  67 y.o. male  PRE-OPERATIVE DIAGNOSIS:  Right open tib fib fracture  POST-OPERATIVE DIAGNOSIS:  Right open tib fib fracture  PROCEDURE:  Procedure(s): 1. REPEAT IRRIGATION AND DEBRIDEMENT EXTREMITY INCLUDING BONE 2. REVISION EXTERNAL FIXATOR 3. ORIF OF RIGHT TIBIA 4. INTRAMEDULLARY (IM) NAIL FIBULA (Right) 5. COMPLEX CLOSURE 10 CM  6. APPLICATION OF SMALL WOUND VAC  SURGEON:  Surgeon(s) and Role:    Altamese Thomaston, MD - Primary  PHYSICIAN ASSISTANT: Ainsley Spinner, PA-C  ANESTHESIA:   general  EBL:  100 mL   BLOOD ADMINISTERED:none  DRAINS:wound vac  LOCAL MEDICATIONS USED:  NONE  SPECIMEN:  No Specimen  DISPOSITION OF SPECIMEN:  N/A  COUNTS:  YES  TOURNIQUET:  * No tourniquets in log *  DICTATION: .Other Dictation: Dictation Number [id number]  PLAN OF CARE: Admit to inpatient   PATIENT DISPOSITION:  PACU - hemodynamically stable.   Delay start of Pharmacological VTE agent (>24hrs) due to surgical blood loss or risk of bleeding: no

## 2017-10-14 LAB — COMPREHENSIVE METABOLIC PANEL
ALBUMIN: 2.3 g/dL — AB (ref 3.5–5.0)
ALT: 53 U/L (ref 17–63)
ANION GAP: 7 (ref 5–15)
AST: 100 U/L — AB (ref 15–41)
Alkaline Phosphatase: 130 U/L — ABNORMAL HIGH (ref 38–126)
BUN: 7 mg/dL (ref 6–20)
CHLORIDE: 98 mmol/L — AB (ref 101–111)
CO2: 27 mmol/L (ref 22–32)
Calcium: 7.8 mg/dL — ABNORMAL LOW (ref 8.9–10.3)
Creatinine, Ser: 0.78 mg/dL (ref 0.61–1.24)
GFR calc Af Amer: >60 mL/min (ref >60)
GFR calc non Af Amer: >60 mL/min (ref >60)
GLUCOSE: 101 mg/dL — AB (ref 65–99)
POTASSIUM: 3.8 mmol/L (ref 3.5–5.1)
SODIUM: 132 mmol/L — AB (ref 135–145)
TOTAL PROTEIN: 5.2 g/dL — AB (ref 6.5–8.1)
Total Bilirubin: 2.8 mg/dL — ABNORMAL HIGH (ref 0.3–1.2)

## 2017-10-14 LAB — TESTOSTERONE, FREE: Testosterone, Free: 1 pg/mL — ABNORMAL LOW (ref 6.6–18.1)

## 2017-10-14 LAB — CBC
HCT: 28.2 % — ABNORMAL LOW (ref 39.0–52.0)
Hemoglobin: 9.9 g/dL — ABNORMAL LOW (ref 13.0–17.0)
MCH: 35.7 pg — ABNORMAL HIGH (ref 26.0–34.0)
MCHC: 35.1 g/dL (ref 30.0–36.0)
MCV: 101.8 fL — ABNORMAL HIGH (ref 78.0–100.0)
PLATELETS: 41 10*3/uL — AB (ref 150–400)
RBC: 2.77 MIL/uL — ABNORMAL LOW (ref 4.22–5.81)
RDW: 15.5 % (ref 11.5–15.5)
WBC: 7.1 10*3/uL (ref 4.0–10.5)

## 2017-10-14 LAB — VITAMIN D 25 HYDROXY (VIT D DEFICIENCY, FRACTURES): VIT D 25 HYDROXY: 13.7 ng/mL — AB (ref 30.0–100.0)

## 2017-10-14 LAB — HEPATITIS PANEL, ACUTE
HEP A IGM: NEGATIVE
HEP B C IGM: NEGATIVE
HEP B S AG: NEGATIVE

## 2017-10-14 LAB — CALCITRIOL (1,25 DI-OH VIT D): Vit D, 1,25-Dihydroxy: 16.3 pg/mL — ABNORMAL LOW (ref 19.9–79.3)

## 2017-10-14 LAB — TESTOSTERONE: Testosterone: 31 ng/dL — ABNORMAL LOW (ref 264–916)

## 2017-10-14 LAB — SEX HORMONE BINDING GLOBULIN: Sex Hormone Binding: 77.2 nmol/L — ABNORMAL HIGH (ref 19.3–76.4)

## 2017-10-14 MED ORDER — CALCIUM CITRATE 950 (200 CA) MG PO TABS
200.0000 mg | ORAL_TABLET | Freq: Two times a day (BID) | ORAL | Status: DC
Start: 2017-10-14 — End: 2017-10-18
  Administered 2017-10-14 – 2017-10-18 (×8): 200 mg via ORAL
  Filled 2017-10-14 (×8): qty 1

## 2017-10-14 MED ORDER — VITAMIN D 1000 UNITS PO TABS
2000.0000 [IU] | ORAL_TABLET | Freq: Two times a day (BID) | ORAL | Status: DC
  Administered 2017-10-14 – 2017-10-18 (×8): 2000 [IU] via ORAL
  Filled 2017-10-14 (×8): qty 2

## 2017-10-14 MED ORDER — VITAMIN C 500 MG PO TABS
500.0000 mg | ORAL_TABLET | Freq: Every day | ORAL | Status: DC
  Administered 2017-10-15 – 2017-10-18 (×4): 500 mg via ORAL
  Filled 2017-10-14 (×4): qty 1

## 2017-10-14 NOTE — Evaluation (Signed)
Occupational Therapy Evaluation Patient Details [name] MRN: [id number] DOB: 1951-08-05 Today's Date: 10/14/2017    History of Present Illness 67 yo male s/p R external fixator Im Nail pMH: 12 (+) beers per day, daily marijuana use, seizures, pNA,    Clinical Impression   Patient is s/p R external fixator IM Nail surgery resulting in functional limitations due to the deficits listed below (see OT problem list). Pt currently requires mod (A) for LB adls and min (A) for balance with transfer RW to 3n1. Pt will be high fall risk if returning home due to ETOH 12 plus beers daily.  Patient will benefit from skilled OT acutely to increase independence and safety with ADLS to allow discharge SNF. Pt lives alone and will need (A) for wound care/ wound vac.      Follow Up Recommendations  SNF    Equipment Recommendations  3 in 1 bedside commode;Wheelchair (measurements OT);Wheelchair cushion (measurements OT)    Recommendations for Other Services       Precautions / Restrictions Precautions Precautions: Fall Precaution Comments: external fixator R LE and would vac Restrictions Weight Bearing Restrictions: Yes RLE Weight Bearing: Non weight bearing      Mobility Bed Mobility               General bed mobility comments: in chair on arrival  Transfers Overall transfer level: Needs assistance Equipment used: Rolling walker (2 wheeled) Transfers: Sit to/from Stand Sit to Stand: Min assist         General transfer comment: cues for placement of hands and L LE against chair for support. Pt with incr time but able to complete transfers    Balance Overall balance assessment: Needs assistance Sitting-balance support: No upper extremity supported Sitting balance-Leahy Scale: Normal     Standing balance support: Bilateral upper extremity supported;During functional activity Standing balance-Leahy Scale: Fair Standing balance comment: heavy reliance on RW and unable  to release RW. pt will require sitting for all adlsl                           ADL either performed or assessed with clinical judgement   ADL Overall ADL's : Needs assistance/impaired Eating/Feeding: Independent   Grooming: Wash/dry face;Wash/dry hands;Set up Grooming Details (indicate cue type and reason): requires seated position. verbalized plan is to use computer chair to wheel himself into the bathroom and prop his leg up once in there Upper Body Bathing: Set up   Lower Body Bathing: Moderate assistance   Upper Body Dressing : Set up   Lower Body Dressing: Moderate assistance   Toilet Transfer: Minimal assistance;RW;BSC Toilet Transfer Details (indicate cue type and reason): pt requires cues for movement with IV / wound vac for safety. pt able to maintain NWB R LE         Functional mobility during ADLs: Minimal assistance;Rolling walker General ADL Comments: pt requires incr time and vc for safety with lines/ leads. pt motivated and moving with control to the bathroom. pt will not have 24/7 (A) upon d/c. Recommend SNF level for RN assistance for wound management with wound vac     Vision Baseline Vision/History: No visual deficits Additional Comments: pt reports "my vision has been fine ever since they removed the cataracts last year" reports no need for glasses and no other medical issues with his eye sight     Perception     Praxis      Pertinent  Vitals/Pain Pain Assessment: Faces Faces Pain Scale: Hurts even more Pain Location: R LE Pain Descriptors / Indicators: Throbbing Pain Intervention(s): Monitored during session;Premedicated before session;Repositioned     Hand Dominance Right   Extremity/Trunk Assessment Upper Extremity Assessment Upper Extremity Assessment: Overall WFL for tasks assessed   Lower Extremity Assessment Lower Extremity Assessment: Defer to PT evaluation   Cervical / Trunk Assessment Cervical / Trunk Assessment: Normal    Communication Communication Communication: No difficulties   Cognition Arousal/Alertness: Awake/alert Behavior During Therapy: WFL for tasks assessed/performed Overall Cognitive Status: Within Functional Limits for tasks assessed                                     General Comments  pt with drainage noted at the external fixator on the dorsal aspect of the external fixator.     Exercises     Shoulder Instructions      Home Living Family/patient expects to be discharged to:: Private residence Living Arrangements: Alone Available Help at Discharge: Family;Available PRN/intermittently Type of Home: House Home Access: Stairs to enter [employer] of Steps: 2   Home Layout: One level     Bathroom Shower/Tub: Teacher, early years/pre: Standard     Home Equipment: None   Additional Comments: dog named Rosco lives outside      Prior Functioning/Environment Level of Independence: Independent        Comments: sister is retired Therapist, sports (2x retired from [employer]) lives 10 minutes away and brother lives 2 houses away        OT Problem List: Decreased activity tolerance;Decreased knowledge of precautions;Decreased knowledge of use of DME or AE;Decreased safety awareness;Decreased strength;Pain      OT Treatment/Interventions: Self-care/ADL training;Therapeutic activities;Therapeutic exercise;Patient/family education;Balance training    OT Goals(Current goals can be found in the care plan section) Acute Rehab OT Goals Patient Stated Goal: to return home asap OT Goal Formulation: With patient Time For Goal Achievement: 10/28/17 Potential to Achieve Goals: Good  OT Frequency: Min 2X/week   Barriers to D/C: Decreased caregiver support  unknown assistance levle from sibilings       Co-evaluation              AM-PAC PT "6 Clicks" Daily Activity     Outcome Measure Help from another person eating meals?: None Help from another person  taking care of personal grooming?: A Little Help from another person toileting, which includes using toliet, bedpan, or urinal?: A Lot Help from another person bathing (including washing, rinsing, drying)?: A Lot Help from another person to put on and taking off regular upper body clothing?: A Lot Help from another person to put on and taking off regular lower body clothing?: A Lot 6 Click Score: 15   End of Session Equipment Utilized During Treatment: Gait belt;Rolling walker Nurse Communication: Mobility status;Precautions;Weight bearing status  Activity Tolerance: Patient tolerated treatment well Patient left: in chair;with call bell/phone within reach;with nursing/sitter in room  OT Visit Diagnosis: Unsteadiness on feet (R26.81);Pain Pain - Right/Left: Right Pain - part of body: Leg                Time: 1421-1443 OT Time Calculation (min): 22 min Charges:  OT General Charges $OT Visit: 1 Visit OT Evaluation $OT Eval Moderate Complexity: 1 Mod G-Codes:      Jeri Modena   OTR/L Pager: [phone] Office: [phone] .   Parke Poisson  B 10/14/2017, 3:32 PM

## 2017-10-14 NOTE — Progress Notes (Signed)
Orthopedic Trauma Service Progress Note   Patient ID: Glen Colon MRN: [id number] DOB/AGE: 1951-04-09 67 y.o.  Subjective:  No complaints  Sitting in chair  Sister in room   Thinks snf will be best place for him to be, at least initially    ROS As above  Objective:   VITALS:   Vitals:   10/13/17 2058 10/14/17 0052 10/14/17 0636 10/14/17 1300  BP: 134/66 (!) 118/57 (!) 108/56 (!) 109/57  Pulse: 91 84 78 84  Resp:    16  Temp: 97.8 F (36.6 C) 98.2 F (36.8 C) 98.6 F (37 C) (!) 94.3 F (34.6 C)  TempSrc: Oral Oral Oral Oral  SpO2: 97% 97% 95% 92%    Estimated body mass index is 29.41 kg/m as calculated from the following:   Height as of 10/11/17: 5\' 10"  (1.778 m).   Weight as of 10/11/17: 93 kg (205 lb).   Intake/Output      01/17 0701 - 01/18 0700 01/18 0701 - 01/19 0700   P.O. 720 480   I.V. 2604.2    IV Piggyback 100    Total Intake 3424.2 480   Urine 1455 500   Drains 30    Blood 100    Total Output 1585 500   Net +1839.2 -20          LABS  Results for orders placed or performed during the hospital encounter of 10/12/17 (from the past 24 hour(s))  CBC     Status: Abnormal   Collection Time: 10/14/17  7:27 AM  Result Value Ref Range   WBC 7.1 4.0 - 10.5 K/uL   RBC 2.77 (L) 4.22 - 5.81 MIL/uL   Hemoglobin 9.9 (L) 13.0 - 17.0 g/dL   HCT 28.2 (L) 39.0 - 52.0 %   MCV 101.8 (H) 78.0 - 100.0 fL   MCH 35.7 (H) 26.0 - 34.0 pg   MCHC 35.1 30.0 - 36.0 g/dL   RDW 15.5 11.5 - 15.5 %   Platelets 41 (L) 150 - 400 K/uL  Comprehensive metabolic panel     Status: Abnormal   Collection Time: 10/14/17  7:27 AM  Result Value Ref Range   Sodium 132 (L) 135 - 145 mmol/L   Potassium 3.8 3.5 - 5.1 mmol/L   Chloride 98 (L) 101 - 111 mmol/L   CO2 27 22 - 32 mmol/L   Glucose, Bld 101 (H) 65 - 99 mg/dL   BUN 7 6 - 20 mg/dL   Creatinine, Ser 0.78 0.61 - 1.24 mg/dL   Calcium 7.8 (L) 8.9 - 10.3 mg/dL   Total Protein 5.2 (L) 6.5 - 8.1 g/dL   Albumin 2.3 (L) 3.5 - 5.0 g/dL   AST 100 (H) 15 - 41 U/L   ALT 53 17 - 63 U/L   Alkaline Phosphatase 130 (H) 38 - 126 U/L   Total Bilirubin 2.8 (H) 0.3 - 1.2 mg/dL   GFR calc non Af Amer >60 >60 mL/min   GFR calc Af Amer >60 >60 mL/min   Anion gap 7 5 - 15    Results for ZHAIRE, LOCKER (MRN [id number]) as of 10/14/2017 17:34  Ref. Range 10/13/2017 08:28  Vit D, 1,25-Dihydroxy Latest Ref Range: 19.9 - 79.3 pg/mL 16.3 (L)  Vitamin D, 25-Hydroxy Latest Ref Range: 30.0 - 100.0 ng/mL 13.7 (L)   Results for ARLINGTON, SIGMUND (MRN [id number]) as of 10/14/2017 17:34  Ref. Range 10/13/2017 08:28  Sex Horm Binding Glob, Serum Latest Ref Range: 19.3 -  76.4 nmol/L 77.2 (H)  Testosterone Latest Ref Range: 264 - 916 ng/dL 31 (L)  Testosterone Free Latest Ref Range: 6.6 - 18.1 pg/mL 1.0 (L)   PHYSICAL EXAM:    WUJ:WJXBJYN in bedside chair, NAD, appears well  Lungs: breathing unlabored  Ext:       Right Lower Extremity   Ex fix stable  Dressings with drainage   VAC functioning   Distal motor and sensory functions intact   Assessment/Plan: 1 Day Post-Op   Active Problems:   Open pilon fracture of tibia   Marijuana abuse   Alcohol dependence (HCC)   Cirrhosis (HCC)   Hepatitis C, chronic (HCC)   Anti-infectives (From admission, onward)   Start     Dose/Rate Route Frequency Ordered Stop   10/13/17 2030  ceFAZolin (ANCEF) IVPB 1 g/50 mL premix     1 g 100 mL/hr over 30 Minutes Intravenous Every 8 hours 10/13/17 1504 10/15/17 2029   10/13/17 0430  ceFAZolin (ANCEF) IVPB 1 g/50 mL premix  Status:  Discontinued     1 g 100 mL/hr over 30 Minutes Intravenous Every 8 hours 10/12/17 2107 10/13/17 1504    .  POD/HD#: 48  67 year old white male fall off of a porch with a open right tibial plafond fracture and right distal fibula fracture, transferred from [facility]   - Fall off porch    -Open right tibial plafond fracture and right fibula fracture s/p irrigation and debridement, IMN R fibula, ex fix  revision R tibia  NWB x 8 weeks  Ex fix will be definitive means of treatment, will be in ex fix 6-8 weeks  Dressing change tomorrow, remove vac on Sunday. Pt may need plastics involvement depending on the condition of his skin at time of dressing change. This was discussed in great detail with the patient   Pt at increased risk for complications due to smoking, drug use and drinking    PT/OT  Ice and elevate  Pin care as needed     Pin Site Instructions  Dress pins daily with Kerlix roll starting on POD 2. Wrap the Kerlix so that it tamps the skin down around the pin-skin interface to prevent/limit motion of the skin relative to the pin.  (Pin-skin motion is the primary cause of pain and infection related to external fixator pin sites).  Remove any crust or coagulum that may obstruct drainage with a saline moistened gauze or soap and water.  After POD 3, if there is no discernable drainage on the pin site dressing, the interval for change can by increased to every other day.  may shower with the fixator, cleaning all pin sites gently with soap and water.  If you have a surgical wound this needs to be completely dry and without drainage before showering.  The extremity can be lifted by the fixator to facilitate wound care and transfers.                  - Pain management:            continue with current regimen    - ABL anemia/Hemodynamics             stable    - Medical issues              Epilepsy-Home medications               History of hepatitis C, transaminitis-chronic  HIV test is negative   + hep c                 Hyponatremia                         likely chronically low due to chronic EtOH use    Monitor                 Chronic alcohol abuse                         Withdrawal protocol                         Vitamin and mineral supplementation   - DVT/PE prophylaxis:             lovenox x 4 weeks   - ID:              Ancef for 24  more hours    - Metabolic Bone Disease:            + vitamin d deficiency    Supplement   + testosterone deficiency    Likely related to marijuana use and EtOH use    - Activity:             Nonweightbearing right leg   - FEN/GI prophylaxis/Foley/Lines:             diet as tolerated                 - Impediments to fracture healing:             Numerous: Alcohol abuse, nicotine dependence, marijuana abuse   - Dispo:            continue therapies  Dressing change Sunday   SNF    Jari Pigg, PA-C [facility] [phone] ([phone] [name] (C) 10/14/2017, 5:32 PM

## 2017-10-14 NOTE — Evaluation (Signed)
Physical Therapy Evaluation Patient Details [name] MRN: [id number] DOB: July 18, 1951 Today's Date: 10/14/2017   History of Present Illness  67 yo male s/p R external fixator Im Nail pMH: 12 (+) beers per day, daily marijuana use, seizures, pNA,   Clinical Impression  Pt was seen for evaluation of transfers to chair and to see how he manages NWB on RLE.  He is not completely safet with transfers, a bit impulsive and unwilling to slow down completely to get instructed with PT.  Pt wishes to use crutches and go directly home, which PT explained to him might be more difficult with a vac on the R foot and ankle.  Will see how he is able to manage, as he really wont be in need of a set of crutches unless he is going home on stairs.  Recommend to him to go to SNF for strengthening and balance training prior to being home alone.    Follow Up Recommendations SNF    Equipment Recommendations  Rolling walker with 5" wheels    Recommendations for Other Services       Precautions / Restrictions Precautions Precautions: Fall Precaution Comments: external fixator R LE and would vac Restrictions Weight Bearing Restrictions: Yes RLE Weight Bearing: Non weight bearing      Mobility  Bed Mobility Overal bed mobility: Needs Assistance Bed Mobility: Supine to Sit     Supine to sit: Min assist;Min guard        Transfers Overall transfer level: Needs assistance Equipment used: Rolling walker (2 wheeled) Transfers: Sit to/from [facility] Sit to Stand: Min assist         General transfer comment: cues for placement of hands and L LE against chair for support. Pt with incr time but able to complete transfers  Ambulation/Gait             General Gait Details: not attempted due to limited ability to maintain NWB on RLE wtih transfer  Stairs            Wheelchair Mobility    Modified Rankin (Stroke Patients Only)       Balance Overall balance  assessment: Needs assistance Sitting-balance support: No upper extremity supported Sitting balance-Leahy Scale: Good     Standing balance support: Bilateral upper extremity supported;During functional activity Standing balance-Leahy Scale: Poor Standing balance comment: RW with assistance for lines                             Pertinent Vitals/Pain Pain Assessment: Faces Faces Pain Scale: Hurts even more Pain Location: R LE Pain Descriptors / Indicators: Operative site guarding;Throbbing Pain Intervention(s): Limited activity within patient's tolerance;Monitored during session;Premedicated before session;Repositioned    Home Living Family/patient expects to be discharged to:: Private residence Living Arrangements: Alone Available Help at Discharge: Family;Available PRN/intermittently Type of Home: House Home Access: Stairs to enter   [employer] of Steps: 2 Home Layout: One level Home Equipment: Crutches      Prior Function Level of Independence: Independent         Comments: sister is retired Therapist, sports (2x retired from [employer]) lives 10 minutes away and brother lives 2 houses away     K[place] Hand: Right    Extremity/Trunk Assessment   Upper Extremity Assessment Upper Extremity Assessment: Overall WFL for tasks assessed    Lower Extremity Assessment Lower Extremity Assessment: RLE deficits/detail RLE Deficits / Details: external  fixators with wound vac R ankle RLE Coordination: decreased fine motor;decreased gross motor    Cervical / Trunk Assessment Cervical / Trunk Assessment: Normal  Communication   Communication: No difficulties  Cognition Arousal/Alertness: Awake/alert Behavior During Therapy: WFL for tasks assessed/performed Overall Cognitive Status: Within Functional Limits for tasks assessed                                        General Comments General comments (skin integrity, edema, etc.):  drainage all over bed with blood     Exercises     Assessment/Plan    PT Assessment Patient needs continued PT services  PT Problem List Decreased strength;Decreased activity tolerance;Decreased range of motion;Decreased balance;Decreased mobility;Decreased coordination;Decreased knowledge of use of DME;Decreased safety awareness;Decreased knowledge of precautions;Cardiopulmonary status limiting activity;Decreased skin integrity;Pain       PT Treatment Interventions DME instruction;Gait training;Stair training;Functional mobility training;Therapeutic activities;Therapeutic exercise;Balance training;Neuromuscular re-education;Patient/family education    PT Goals (Current goals can be found in the Care Plan section)  Acute Rehab PT Goals Patient Stated Goal: to return home asap PT Goal Formulation: With patient Time For Goal Achievement: 10/28/17 Potential to Achieve Goals: Good    Frequency Min 4X/week   Barriers to discharge Inaccessible home environment;Decreased caregiver support home alone with stairs to navigate    Co-evaluation               AM-PAC PT "6 Clicks" Daily Activity  Outcome Measure Difficulty turning over in bed (including adjusting bedclothes, sheets and blankets)?: A Little Difficulty moving from lying on back to sitting on the side of the bed? : A Little Difficulty sitting down on and standing up from a chair with arms (e.g., wheelchair, bedside commode, etc,.)?: Unable Help needed moving to and from a bed to chair (including a wheelchair)?: A Little Help needed walking in hospital room?: A Lot Help needed climbing 3-5 steps with a railing? : Total 6 Click Score: 13    End of Session Equipment Utilized During Treatment: Gait belt Activity Tolerance: Patient tolerated treatment well;Patient limited by fatigue Patient left: in chair;with call bell/phone within reach;with family/visitor present Nurse Communication: Mobility status PT Visit Diagnosis:  Unsteadiness on feet (R26.81);Muscle weakness (generalized) (M62.81);History of falling (Z91.81);Difficulty in walking, not elsewhere classified (R26.2);Pain Pain - Right/Left: Right Pain - part of body: Leg    Time: 1352-1415 PT Time Calculation (min) (ACUTE ONLY): 23 min   Charges:   PT Evaluation $PT Eval Moderate Complexity: 1 Mod PT Treatments $Therapeutic Activity: 8-22 mins   PT G Codes:   PT G-Codes **NOT FOR INPATIENT CLASS** Functional Assessment Tool Used: AM-PAC 6 Clicks Basic Mobility    Ramond Dial 10/14/2017, 8:56 PM   Mee Hives, PT MS Acute Rehab Dept. Number: [place] and [place]

## 2017-10-15 LAB — CBC
HEMATOCRIT: 29.8 % — AB (ref 39.0–52.0)
Hemoglobin: 10.2 g/dL — ABNORMAL LOW (ref 13.0–17.0)
MCH: 34.7 pg — ABNORMAL HIGH (ref 26.0–34.0)
MCHC: 34.2 g/dL (ref 30.0–36.0)
MCV: 101.4 fL — AB (ref 78.0–100.0)
PLATELETS: 52 10*3/uL — AB (ref 150–400)
RBC: 2.94 MIL/uL — AB (ref 4.22–5.81)
RDW: 15.3 % (ref 11.5–15.5)
WBC: 6.6 10*3/uL (ref 4.0–10.5)

## 2017-10-15 LAB — COMPREHENSIVE METABOLIC PANEL
ALT: 54 U/L (ref 17–63)
AST: 110 U/L — AB (ref 15–41)
Albumin: 2.4 g/dL — ABNORMAL LOW (ref 3.5–5.0)
Alkaline Phosphatase: 189 U/L — ABNORMAL HIGH (ref 38–126)
Anion gap: 8 (ref 5–15)
BUN: 10 mg/dL (ref 6–20)
CHLORIDE: 96 mmol/L — AB (ref 101–111)
CO2: 26 mmol/L (ref 22–32)
Calcium: 7.7 mg/dL — ABNORMAL LOW (ref 8.9–10.3)
Creatinine, Ser: 0.75 mg/dL (ref 0.61–1.24)
Glucose, Bld: 97 mg/dL (ref 65–99)
Potassium: 3.8 mmol/L (ref 3.5–5.1)
SODIUM: 130 mmol/L — AB (ref 135–145)
Total Bilirubin: 2.5 mg/dL — ABNORMAL HIGH (ref 0.3–1.2)
Total Protein: 5.6 g/dL — ABNORMAL LOW (ref 6.5–8.1)

## 2017-10-15 LAB — MAGNESIUM: MAGNESIUM: 1.8 mg/dL (ref 1.7–2.4)

## 2017-10-15 LAB — PHOSPHORUS: Phosphorus: 2.3 mg/dL — ABNORMAL LOW (ref 2.5–4.6)

## 2017-10-15 LAB — TSH: TSH: 3.873 u[IU]/mL (ref 0.350–4.500)

## 2017-10-15 NOTE — Progress Notes (Signed)
CSW met with patient to discuss recommendation for SNF. CSW provided information to patient about how SNF placement would work with his Medicaid, and how he would have to stay for 30 days at a facility and would have to give his Medicaid check over as payment for his SNF stay. Patient is not able to do so, and he says he will be able to have support at home to care for himself. Patient has siblings that live close, as well as a son that lives nearby. Patient discussed how he had to have surgery on his other foot years ago and was able to provide for himself at home.   Patient not interested in SNF placement at this time. CSW also asked patient about setting up home health services to have therapy come work with him in the home; patient was not interested in this service, either. Patient said he will just take care of himself.  CSW signing off.  Laveda Abbe, [place] Clinical Social Worker [phone]

## 2017-10-15 NOTE — Progress Notes (Signed)
Orthopedic Trauma Service Progress Note   Patient ID: Glen Colon MRN: [id number] DOB/AGE: [birth year] 65 y.o.  Subjective:  Doing well Feels that he has to have BM + Flatus Voiding well No new issues    Review of Systems  Constitutional: Negative for chills and fever.  Respiratory: Negative for shortness of breath.   Cardiovascular: Negative for chest pain and palpitations.  Gastrointestinal: Negative for abdominal pain, nausea and vomiting.    Objective:   VITALS:   Vitals:   10/14/17 0636 10/14/17 1300 10/14/17 1938 10/15/17 0419  BP: (!) 108/56 (!) 109/57 (!) 122/56 103/73  Pulse: 78 84 85 81  Resp:  16  17  Temp: 98.6 F (37 C) (!) 94.3 F (34.6 C) 98.4 F (36.9 C) 98.1 F (36.7 C)  TempSrc: Oral Oral Oral Oral  SpO2: 95% 92% 96% 95%    Estimated body mass index is 29.41 kg/m as calculated from the following:   Height as of 10/11/17: 5\' 10"  (1.778 m).   Weight as of 10/11/17: 93 kg (205 lb).   Intake/Output      01/18 0701 - 01/19 0700 01/19 0701 - 01/20 0700   P.O. 720    I.V.     IV Piggyback     Total Intake 720    Urine 2825    Drains     Blood     Total Output 2825    Net -2105           LABS  Results for orders placed or performed during the hospital encounter of 10/12/17 (from the past 24 hour(s))  CBC     Status: Abnormal   Collection Time: 10/15/17  6:14 AM  Result Value Ref Range   WBC 6.6 4.0 - 10.5 K/uL   RBC 2.94 (L) 4.22 - 5.81 MIL/uL   Hemoglobin 10.2 (L) 13.0 - 17.0 g/dL   HCT 29.8 (L) 39.0 - 52.0 %   MCV 101.4 (H) 78.0 - 100.0 fL   MCH 34.7 (H) 26.0 - 34.0 pg   MCHC 34.2 30.0 - 36.0 g/dL   RDW 15.3 11.5 - 15.5 %   Platelets 52 (L) 150 - 400 K/uL  TSH     Status: None   Collection Time: 10/15/17  6:14 AM  Result Value Ref Range   TSH 3.873 0.350 - 4.500 uIU/mL  Phosphorus     Status: Abnormal   Collection Time: 10/15/17  6:14 AM  Result Value Ref Range   Phosphorus 2.3 (L) 2.5 - 4.6 mg/dL   Magnesium     Status: None   Collection Time: 10/15/17  6:14 AM  Result Value Ref Range   Magnesium 1.8 1.7 - 2.4 mg/dL  Comprehensive metabolic panel     Status: Abnormal   Collection Time: 10/15/17  6:14 AM  Result Value Ref Range   Sodium 130 (L) 135 - 145 mmol/L   Potassium 3.8 3.5 - 5.1 mmol/L   Chloride 96 (L) 101 - 111 mmol/L   CO2 26 22 - 32 mmol/L   Glucose, Bld 97 65 - 99 mg/dL   BUN 10 6 - 20 mg/dL   Creatinine, Ser 0.75 0.61 - 1.24 mg/dL   Calcium 7.7 (L) 8.9 - 10.3 mg/dL   Total Protein 5.6 (L) 6.5 - 8.1 g/dL   Albumin 2.4 (L) 3.5 - 5.0 g/dL   AST 110 (H) 15 - 41 U/L   ALT 54 17 - 63 U/L   Alkaline Phosphatase 189 (H) 38 -  126 U/L   Total Bilirubin 2.5 (H) 0.3 - 1.2 mg/dL   GFR calc non Af Amer >60 >60 mL/min   GFR calc Af Amer >60 >60 mL/min   Anion gap 8 5 - 15     PHYSICAL EXAM:   Gen: awake and alert, resting comfortably in bed, NAD Lungs: breathing unlabore Cardiac: RRR Ext:       Right Lower Extremity   Dressings saturated  Dressings removed  Wounds weeping but look good  Vac with good seal   Ext warm   + DP pulse  Distal motor and sensory functions intact  Ex fix is stable, pinsites look good  Lateral heel is slightly macerated but looks good   New dressings and ace wrap applied   Assessment/Plan: 2 Days Post-Op   Active Problems:   Open pilon fracture of tibia   Marijuana abuse   Alcohol dependence (HCC)   Cirrhosis (HCC)   Hepatitis C, chronic (HCC)   Anti-infectives (From admission, onward)   Start     Dose/Rate Route Frequency Ordered Stop   10/13/17 2030  ceFAZolin (ANCEF) IVPB 1 g/50 mL premix     1 g 100 mL/hr over 30 Minutes Intravenous Every 8 hours 10/13/17 1504 10/15/17 2029   10/13/17 0430  ceFAZolin (ANCEF) IVPB 1 g/50 mL premix  Status:  Discontinued     1 g 100 mL/hr over 30 Minutes Intravenous Every 8 hours 10/12/17 2107 10/13/17 1504    .  POD/HD#: 62  67 year old white male fall off of a porch with a open  right tibial plafond fracture and right distal fibula fracture, transferred from [facility]   - Fall off porch    -Open right tibial plafond fracture and right fibula fracture s/p irrigation and debridement, IMN R fibula, ex fix revision R tibia             NWB x 8 weeks             Ex fix will be definitive means of treatment, will be in ex fix 6-8 weeks             Dressings changed today  pincare done today as well  Will change dressings again tomorrow and remove vac at that time  May need plastics eval depending on what the medial skin looks like                PT/OT             Ice and elevate             Pin care as needed                           Pin Site Instructions   Dress pins daily with Kerlix roll starting on POD 2. Wrap the Kerlix so that it tamps the skin down around the pin-skin interface to prevent/limit motion of the skin relative to the pin.  (Pin-skin motion is the primary cause of pain and infection related to external fixator pin sites).   Remove any crust or coagulum that may obstruct drainage with a saline moistened gauze or soap and water.   After POD 3, if there is no discernable drainage on the pin site dressing, the interval for change can by increased to every other day.   may shower with the fixator, cleaning all pin sites gently with soap and water.  If you have a surgical wound  this needs to be completely dry and without drainage before showering.   The extremity can be lifted by the fixator to facilitate wound care and transfers.       - Pain management:            continue with current regimen    - ABL anemia/Hemodynamics             stable    - Medical issues              Epilepsy-Home medications               History of hepatitis C, transaminitis-chronic                         HIV test is negative                         + hep c                 Hyponatremia                         likely chronically low due to chronic EtOH use                           Monitor     NSL IV   bmet in am                Chronic alcohol abuse                         Withdrawal protocol                         Vitamin and mineral supplementation   - DVT/PE prophylaxis:             lovenox x 4 weeks    - ID:              Ancef completed today for open fracture treatment    - Metabolic Bone Disease:            + vitamin d deficiency                          Supplement              + testosterone deficiency                          Likely related to marijuana use and EtOH use    - Activity:             Nonweightbearing right leg   - FEN/GI prophylaxis/Foley/Lines:             diet as tolerated      - Impediments to fracture healing:             Numerous: Alcohol abuse, nicotine dependence, marijuana abuse   - Dispo:            continue therapies             dressing change tomorrow   Hopeful for SNF Monday    SW consult for SNF       Jari Pigg, PA-C [facility] [phone] (P) [phone] (O) [phone] (C)  10/15/2017, 10:51 AM

## 2017-10-16 LAB — COMPREHENSIVE METABOLIC PANEL
ALBUMIN: 2.3 g/dL — AB (ref 3.5–5.0)
ALT: 52 U/L (ref 17–63)
AST: 105 U/L — AB (ref 15–41)
Alkaline Phosphatase: 198 U/L — ABNORMAL HIGH (ref 38–126)
Anion gap: 7 (ref 5–15)
BILIRUBIN TOTAL: 2.4 mg/dL — AB (ref 0.3–1.2)
BUN: 9 mg/dL (ref 6–20)
CHLORIDE: 98 mmol/L — AB (ref 101–111)
CO2: 25 mmol/L (ref 22–32)
CREATININE: 0.78 mg/dL (ref 0.61–1.24)
Calcium: 7.8 mg/dL — ABNORMAL LOW (ref 8.9–10.3)
GFR calc Af Amer: >60 mL/min (ref >60)
GLUCOSE: 145 mg/dL — AB (ref 65–99)
POTASSIUM: 3.6 mmol/L (ref 3.5–5.1)
Sodium: 130 mmol/L — ABNORMAL LOW (ref 135–145)
Total Protein: 5.8 g/dL — ABNORMAL LOW (ref 6.5–8.1)

## 2017-10-16 LAB — PTH, INTACT AND CALCIUM
CALCIUM TOTAL (PTH): 7.5 mg/dL — AB (ref 8.6–10.2)
PTH: 30 pg/mL (ref 15–65)

## 2017-10-16 NOTE — Progress Notes (Signed)
Orthopaedic Trauma Service (OTS)  3 Days Post-Op Procedure(s) (LRB): REPEAT IRRIGATION AND DEBRIDEMENT EXTREMITY WITH ADJUSTMENT OF EXTERNAL FIXITOR (Right) EXTERNAL FIXATION LEG (Right) INTRAMEDULLARY (IM) NAIL FIBULA (Right)  Subjective: Patient reports pain as mild.    Objective: Current Vitals Blood pressure 115/61, pulse 89, temperature 98.1 F (36.7 C), temperature source Oral, resp. rate 17, SpO2 93 %. Vital signs in last 24 hours: Temp:  [97.4 F (36.3 C)-98.7 F (37.1 C)] 98.1 F (36.7 C) (01/20 0322) Pulse Rate:  [89-91] 89 (01/20 0318) Resp:  [17] 17 (01/19 1402) BP: (111-121)/(58-63) 115/61 (01/20 0318) SpO2:  [93 %-96 %] 93 % (01/20 0318)  Intake/Output from previous day: 01/19 0701 - 01/20 0700 In: 240 [P.O.:240] Out: 753 [Urine:751; Stool:2]  LABS Recent Labs    10/14/17 0727 10/15/17 0614  HGB 9.9* 10.2*   Recent Labs    10/14/17 0727 10/15/17 0614  WBC 7.1 6.6  RBC 2.77* 2.94*  HCT 28.2* 29.8*  PLT 41* 52*   Recent Labs    10/14/17 0727 10/15/17 0614  NA 132* 130*  K 3.8 3.8  CL 98* 96*  CO2 27 26  BUN 7 10  CREATININE 0.78 0.75  GLUCOSE 101* 97  CALCIUM 7.8* 7.7*   No results for input(s): LABPT, INR in the last 72 hours.   Physical Exam RLE Dressing with copious serous drainage  Edema/ swelling controlled  Wound looks healthy and stable, serous drainage  Sens: DPN, SPN, TN intact  Motor: EHL, FHL, and lessor toe ext and flex all intact grossly  Brisk cap refill, warm to touch  Assessment/Plan: 3 Days Post-Op Procedure(s) (LRB): REPEAT IRRIGATION AND DEBRIDEMENT EXTREMITY WITH ADJUSTMENT OF EXTERNAL FIXITOR (Right) EXTERNAL FIXATION LEG (Right) INTRAMEDULLARY (IM) NAIL FIBULA (Right) 1. PT/OT with strict NWB 2. Will need daily drsg changes daily; may shower with soap and water; elevate 3. DVT proph ill advised given low plts 4. Asking SW to discuss facility even short term one more time 5. D/c today. F/u 8-14  days  Altamese , MD [facility] [phone] (p)

## 2017-10-17 NOTE — Progress Notes (Signed)
Orthopedic Trauma Service Progress Note   Patient ID: Glen Colon MRN: [id number] DOB/AGE: 65/24/1952 67 y.o.  Subjective:  Doing fine  Has come to the realization that he needs a short term rehab center because he will be unable to ride his lawnmower to the store to get his groceries   His sister and brother live near him but sounds like pt has volunteered them to take care of him   ROS As above   Objective:   VITALS:   Vitals:   10/16/17 0322 10/16/17 1544 10/16/17 2139 10/17/17 0605  BP:  127/68 115/61 112/61  Pulse:  93 84 78  Resp:  18 18 18   Temp: 98.1 F (36.7 C) 98.5 F (36.9 C) 98.6 F (37 C) 98 F (36.7 C)  TempSrc: Oral Oral Oral Oral  SpO2:  95% 92% 93%    Estimated body mass index is 29.41 kg/m as calculated from the following:   Height as of 10/11/17: 5\' 10"  (1.778 m).   Weight as of 10/11/17: 93 kg (205 lb).   Intake/Output      01/20 0701 - 01/21 0700 01/21 0701 - 01/22 0700   P.O.  240   Total Intake  240   Urine 1200 325   Stool     Total Output 1200 325   Net -1200 -85        Urine Occurrence 2 x    Stool Occurrence 1 x      LABS  Results for orders placed or performed during the hospital encounter of 10/12/17 (from the past 24 hour(s))  Comprehensive metabolic panel     Status: Abnormal   Collection Time: 10/16/17 12:29 PM  Result Value Ref Range   Sodium 130 (L) 135 - 145 mmol/L   Potassium 3.6 3.5 - 5.1 mmol/L   Chloride 98 (L) 101 - 111 mmol/L   CO2 25 22 - 32 mmol/L   Glucose, Bld 145 (H) 65 - 99 mg/dL   BUN 9 6 - 20 mg/dL   Creatinine, Ser 0.78 0.61 - 1.24 mg/dL   Calcium 7.8 (L) 8.9 - 10.3 mg/dL   Total Protein 5.8 (L) 6.5 - 8.1 g/dL   Albumin 2.3 (L) 3.5 - 5.0 g/dL   AST 105 (H) 15 - 41 U/L   ALT 52 17 - 63 U/L   Alkaline Phosphatase 198 (H) 38 - 126 U/L   Total Bilirubin 2.4 (H) 0.3 - 1.2 mg/dL   GFR calc non Af Amer >60 >60 mL/min   GFR calc Af Amer >60 >60 mL/min   Anion gap 7 5 - 15      PHYSICAL EXAM:   Gen: awake, alert, resting comfortably in bed. Has already eaten breakfast and has had a bath  Ext:       Right Lower Extremity   Ex fix is stable  Dressings are stable, drainage decreasing   Ext warm    Motor and sensory functions are grossly intact  Swelling as expected   Assessment/Plan: 4 Days Post-Op   Active Problems:   Open pilon fracture of tibia   Marijuana abuse   Alcohol dependence (HCC)   Cirrhosis (HCC)   Hepatitis C, chronic (HCC)   Anti-infectives (From admission, onward)   Start     Dose/Rate Route Frequency Ordered Stop   10/13/17 2030  ceFAZolin (ANCEF) IVPB 1 g/50 mL premix     1 g 100 mL/hr over 30 Minutes Intravenous Every 8 hours 10/13/17 1504 10/15/17 1229  10/13/17 0430  ceFAZolin (ANCEF) IVPB 1 g/50 mL premix  Status:  Discontinued     1 g 100 mL/hr over 30 Minutes Intravenous Every 8 hours 10/12/17 2107 10/13/17 1504    .  POD/HD#: 83  67 year old white male fall off of a porch with a open right tibial plafond fracture and right distal fibula fracture, transferred from [facility]   - Fall off porch    -Open right tibial plafond fracture and right fibula fracture s/p irrigation and debridement, IMN R fibula, ex fix revision R tibia             NWB x 8 weeks             Ex fix will be definitive means of treatment, will be in ex fix 6-8 weeks   Pt has reconsidered rehab facility  Will get CIR consult and have SW talk to pt again about SNF   Pt at high risk for complications including soft tissue breakdown, infection which could lead to loss of limb                 Dressings changes daily, including pin care  Will change dressing before dc                PT/OT             Ice and elevate             Pin care as needed                           Pin Site Instructions   Dress pins daily with Kerlix roll starting on POD 2. Wrap the Kerlix so that it tamps the skin down around the pin-skin interface to prevent/limit motion  of the skin relative to the pin.  (Pin-skin motion is the primary cause of pain and infection related to external fixator pin sites).   Remove any crust or coagulum that may obstruct drainage with a saline moistened gauze or soap and water.   After POD 3, if there is no discernable drainage on the pin site dressing, the interval for change can by increased to every other day.   may shower with the fixator, cleaning all pin sites gently with soap and water.  If you have a surgical wound this needs to be completely dry and without drainage before showering.   The extremity can be lifted by the fixator to facilitate wound care and transfers.       - Pain management:            continue with current regimen    - ABL anemia/Hemodynamics             stable    - Medical issues              Epilepsy-Home medications               History of hepatitis C, transaminitis-chronic                         HIV test is negative                         + hep c                 Hyponatremia  likely chronically low due to chronic EtOH use                          Monitor                           NSL IV                         bmet in am                Chronic alcohol abuse                         Withdrawal protocol                         Vitamin and mineral supplementation   Thrombocytopenia   Likely chronic due to chronic etoh, liver disease    - DVT/PE prophylaxis:             lovenox on hold due to thrombocytopenia   - ID:              Ancef completed today for open fracture treatment    - Metabolic Bone Disease:            + vitamin d deficiency                          Supplement              + testosterone deficiency                          Likely related to marijuana use and EtOH use    - Activity:             Nonweightbearing right leg   - FEN/GI prophylaxis/Foley/Lines:             diet as tolerated      - Impediments to fracture healing:              Numerous: Alcohol abuse, nicotine dependence, marijuana abuse   - Dispo:            continue therapies             CIR consult  SW to talk to pt about SNF    Jari Pigg, PA-C [facility] [phone] (P) [phone] [name] (C) 10/17/2017, 8:32 AM

## 2017-10-17 NOTE — Clinical Social Work Note (Signed)
Clinical Social Work Assessment  Patient Details  [name] MRN: [id number] Date of Birth: October 14, 1950  Date of referral:  10/17/17               Reason for consult:  Facility Placement                Permission sought to share information with:  Chartered certified accountant granted to share information::  Yes, Verbal Permission Granted  Name::     Ray Sprong, [place]::  SNF  Relationship::  brother & sister  Contact Information:     Housing/Transportation Living arrangements for the past 2 months:  Single Family Home Source of Information:  Patient Patient Interpreter Needed:  None Criminal Activity/Legal Involvement Pertinent to Current Situation/Hospitalization:  No - Comment as needed Significant Relationships:  Siblings Lives with:  Self Do you feel safe going back to the place where you live?  No Need for family participation in patient care:  No (Coment)  Care giving concerns:  Pt resides home alone and with new impairment will need SNF. Pt confirms that his brother lives very close to him and sister can assist him as needed. Pt confirmed that they indicated that they would help out.  CSW discussed with patient medicaid options. CSW explained to patient that medicaid covers room and board and therefore the specialty care like physical therapy is not covered and he will have to provide his monthly income. Pt does not want to give up his monthly income due to his home expenses. CSW validated and discussed the barriers with finding SNF that accepts medicaid as it is not accepted by all SNF's and CSW discussed of the barriers due to his alcohol daily usage. Pt voiced understanding and does not want to give up his monthly income.  Social Worker assessment / plan:  CSW wil f/u on SNF bed offers and discuss. Pt will have to decide if he can be flexible and give up income as he indicated that his sister and brother will have to help him.   Employment  status:  Disabled (Comment on whether or not currently receiving Disability) Insurance information:  Medicaid [place] PT Recommendations:  [place] / Referral to community resources:  [place]  Patient/Family's Response to care:  Patient appreciative of care and discussions with SNF options. Pt thanked CSW for meeting to discuss.  Patient/Family's Understanding of and Emotional Response to Diagnosis, Current Treatment, and Prognosis:  Patient has good understanding of his impairment and limitations. He does not want to provide monthly income to SNF for short term rehab. Pt indicates that he has a brother and sister that can assist him at home. Pt agreed to let CSW discuss SNF offers if available. CSW will continue to assist with safe discharge plan. CSW f/u with RNCM as a back up.  Emotional Assessment Appearance:  Appears stated age Attitude/Demeanor/Rapport:  (Cooperative) Affect (typically observed):  Accepting, Appropriate Orientation:  Oriented to Situation, Oriented to  Time, Oriented to Place, Oriented to Self Alcohol / Substance use:  Not Applicable Psych involvement (Current and /or in the community):  No (Comment)  Discharge Needs  Concerns to be addressed:  Discharge Planning Concerns Readmission within the last 30 days:  No Current discharge risk:  Dependent with Mobility, Physical Impairment, Lives alone, Substance Abuse Barriers to Discharge:  No Barriers Identified   Normajean Baxter, LCSW 10/17/2017, 10:47 AM

## 2017-10-17 NOTE — NC FL2 (Signed)
[place] LEVEL OF CARE SCREENING TOOL     IDENTIFICATION  Patient [name] Birthdate: 14-Aug-1951 Sex: male Admission Date (Current Location): 10/12/2017  [facility] and [place] Number:  Engineering geologist and Address:  [facility], [place] [address] [place], [place] [address]      Provider Number: [id number]  Attending Physician Name and Address:  Altamese Dunlap, MD  Relative Name and Phone Number:  Marjorie Lussier, brother, [phone]    Current Level of Care: Hospital Recommended Level of Care: [place] Prior Approval Number:    Date Approved/Denied:   PASRR Number: [id number]  Discharge Plan: SNF    Current Diagnoses: Patient Active Problem List   Diagnosis Date Noted  . Marijuana abuse 10/13/2017  . Alcohol dependence ([place]) 10/13/2017  . Cirrhosis ([place]) 10/13/2017  . Hepatitis C, chronic ([place]) 10/13/2017  . Open pilon fracture of tibia 10/12/2017  . Open fracture of distal end of fibula and tibia 10/11/2017  . Chest pain 01/16/2013  . Tobacco abuse     Orientation RESPIRATION BLADDER Height & Weight     Self, Time, Situation, Place  Normal Continent Weight:   Height:     BEHAVIORAL SYMPTOMS/MOOD NEUROLOGICAL BOWEL NUTRITION STATUS      Continent Diet(See DC Summary)  AMBULATORY STATUS COMMUNICATION OF NEEDS Skin   Limited Assist Verbally Surgical wounds, Wound Vac                       Personal Care Assistance Level of Assistance  Dressing, Bathing, Feeding Bathing Assistance: Maximum assistance Feeding assistance: Independent Dressing Assistance: Maximum assistance     Functional Limitations Info  Sight, Hearing, Speech Sight Info: Adequate Hearing Info: Adequate Speech Info: Adequate    SPECIAL CARE FACTORS FREQUENCY  PT (By licensed PT), OT (By licensed OT)     PT Frequency: 5x week OT Frequency: 5x week            Contractures      Additional Factors Info  Code  Status, Allergies Code Status Info: Full Allergies Info: [place]           Current Medications (10/17/2017):  This is the current hospital active medication list Current Facility-Administered Medications  Medication Dose Route Frequency Provider Last Rate Last Dose  . acetaminophen (TYLENOL) tablet 650 mg  650 mg Oral Q6H PRN Ainsley Spinner, PA-C       Or  . acetaminophen (TYLENOL) suppository 650 mg  650 mg Rectal Q6H PRN Ainsley Spinner, PA-C      . calcium citrate (CALCITRATE - dosed in mg elemental calcium) tablet 200 mg of elemental calcium  200 mg of elemental calcium Oral BID Ainsley Spinner, PA-C   200 mg of elemental calcium at 10/17/17 0817  . cholecalciferol (VITAMIN D) tablet 2,000 Units  2,000 Units Oral BID Ainsley Spinner, PA-C   2,000 Units at 10/17/17 0818  . docusate sodium (COLACE) capsule 100 mg  100 mg Oral BID Ainsley Spinner, PA-C   100 mg at 10/17/17 3716  . folic acid (FOLVITE) tablet 1 mg  1 mg Oral Daily Ainsley Spinner, PA-C   1 mg at 10/17/17 9678  . HYDROmorphone (DILAUDID) injection 0.5-1 mg  0.5-1 mg Intravenous Q2H PRN Ainsley Spinner, PA-C   0.5 mg at 10/13/17 0805  . lactated ringers infusion   Intravenous Continuous Belinda Block, MD 10 mL/hr at 10/14/17 0600    . methocarbamol (ROBAXIN) tablet 1,000 mg  1,000 mg Oral QID Ainsley Spinner, PA-C   1,000 mg at 10/17/17 5732   Or  . methocarbamol (ROBAXIN) 1,000 mg in dextrose 5 % 50 mL IVPB  1,000 mg Intravenous QID Ainsley Spinner, PA-C      . multivitamin with minerals tablet 1 tablet  1 tablet Oral Daily Ainsley Spinner, PA-C   1 tablet at 10/17/17 0816  . ondansetron (ZOFRAN) tablet 4 mg  4 mg Oral Q6H PRN Ainsley Spinner, PA-C       Or  . ondansetron [facility]) injection 4 mg  4 mg Intravenous Q6H PRN Ainsley Spinner, PA-C      . oxyCODONE (Oxy IR/ROXICODONE) immediate release tablet 5-10 mg  5-10 mg Oral Q3H PRN Ainsley Spinner, PA-C   10 mg at 10/17/17 0816  . PHENobarbital (LUMINAL) tablet 97.2 mg  97.2 mg Oral QHS Ainsley Spinner, PA-C   97.2 mg at  10/16/17 2048  . polyethylene glycol (MIRALAX / GLYCOLAX) packet 17 g  17 g Oral Daily Ainsley Spinner, PA-C   17 g at 10/15/17 0841  . thiamine (VITAMIN B-1) tablet 100 mg  100 mg Oral Daily Ainsley Spinner, PA-C   100 mg at 10/17/17 2025   Or  . thiamine (B-1) injection 100 mg  100 mg Intravenous Daily Ainsley Spinner, PA-C      . vitamin C (ASCORBIC ACID) tablet 500 mg  500 mg Oral Daily Ainsley Spinner, PA-C   500 mg at 10/17/17 4270     Discharge Medications: Please see discharge summary for a list of discharge medications.  Relevant Imaging Results:  Relevant Lab Results:   Additional Information SS: [id number]  Normajean Baxter, LCSW

## 2017-10-17 NOTE — Progress Notes (Signed)
Physical Therapy Treatment Patient Details [name] MRN: [id number] DOB: 1950-11-25 Today's Date: 10/17/2017    History of Present Illness 67 yo male s/p R external fixator Im Nail pMH: 12 (+) beers per day, daily marijuana use, seizures, pNA,     PT Comments    Patient is making gradual progress toward mobility goals. Pt required standing or seated rest break every 16-29ft due to SOB and SpO2 desat to 86% on RA while ambulating. SpO2 up to 94% on RA with rest and PLB. Pt with one LOB when turning. Continue to progress as tolerated with anticipated d/c to SNF for further skilled PT services.     Follow Up Recommendations  SNF     Equipment Recommendations  Rolling walker with 5" wheels(wide)    Recommendations for Other Services       Precautions / Restrictions Precautions Precautions: Fall Precaution Comments: external fixator R LE  Restrictions Weight Bearing Restrictions: Yes RLE Weight Bearing: Non weight bearing    Mobility  Bed Mobility               General bed mobility comments: pt OOB in chair upon arrival  Transfers Overall transfer level: Needs assistance Equipment used: Rolling walker (2 wheeled) Transfers: Sit to/from Stand Sit to Stand: Min assist         General transfer comment: assist to steady; cues for safe hand placement  Ambulation/Gait Ambulation/Gait assistance: Min assist Ambulation Distance (Feet): (~67ft X2) Assistive device: Rolling walker (2 wheeled)(wide) Gait Pattern/deviations: Step-to pattern Gait velocity: decreased   General Gait Details: pt demonstrated safe use of AD; LOB X1 when turning; assist for balance at times; pt required rest break every 16-62ft due to SOB; SpO2 desat to 86% on RA while ambulating and up to 94% with rest and PLB   Stairs            Wheelchair Mobility    Modified Rankin (Stroke Patients Only)       Balance Overall balance assessment: Needs assistance Sitting-balance  support: No upper extremity supported Sitting balance-Leahy Scale: Good     Standing balance support: Bilateral upper extremity supported;During functional activity Standing balance-Leahy Scale: Poor                              Cognition Arousal/Alertness: Awake/alert Behavior During Therapy: WFL for tasks assessed/performed Overall Cognitive Status: Within Functional Limits for tasks assessed                                        Exercises      General Comments        Pertinent Vitals/Pain Pain Assessment: Faces Faces Pain Scale: Hurts a little bit Pain Location: R LE in dependent position Pain Descriptors / Indicators: Throbbing;Aching Pain Intervention(s): Monitored during session;Limited activity within patient's tolerance;Premedicated before session;Repositioned    Home Living                      Prior Function            PT Goals (current goals can now be found in the care plan section) Acute Rehab PT Goals PT Goal Formulation: With patient Time For Goal Achievement: 10/28/17 Potential to Achieve Goals: Good Progress towards PT goals: Progressing toward goals    Frequency    Min 4X/week  PT Plan Current plan remains appropriate    Co-evaluation              AM-PAC PT "6 Clicks" Daily Activity  Outcome Measure  Difficulty turning over in bed (including adjusting bedclothes, sheets and blankets)?: A Little Difficulty moving from lying on back to sitting on the side of the bed? : A Little Difficulty sitting down on and standing up from a chair with arms (e.g., wheelchair, bedside commode, etc,.)?: Unable Help needed moving to and from a bed to chair (including a wheelchair)?: A Little Help needed walking in hospital room?: A Little Help needed climbing 3-5 steps with a railing? : Total 6 Click Score: 14    End of Session Equipment Utilized During Treatment: Gait belt Activity Tolerance: Patient  limited by fatigue Patient left: in chair;with call bell/phone within reach Nurse Communication: Mobility status PT Visit Diagnosis: Unsteadiness on feet (R26.81);Muscle weakness (generalized) (M62.81);History of falling (Z91.81);Difficulty in walking, not elsewhere classified (R26.2);Pain Pain - Right/Left: Right Pain - part of body: Leg     Time: 1003-1030 PT Time Calculation (min) (ACUTE ONLY): 27 min  Charges:  $Gait Training: 8-22 mins $Therapeutic Activity: 8-22 mins                    G Codes:       Earney Navy, PTA Pager: [phone]     Darliss Cheney 10/17/2017, 10:43 AM

## 2017-10-17 NOTE — Progress Notes (Signed)
Occupational Therapy Treatment Patient Details [name] MRN: [id number] DOB: 12-May-1951 Today's Date: 10/17/2017    History of present illness 67 yo male s/p R external fixator Im Nail pMH: 12 (+) beers per day, daily marijuana use, seizures, pNA,    OT comments  Pt declined SNF per notes previously so CIR recommendation now placed. Pt could benefit from safety awareness and activity tolerance with adls. Pt fatigues quickly and demonstrates placing R LE on the floor. Pt unable to maintain NWB R LE at this time.    Follow Up Recommendations  CIR    Equipment Recommendations  3 in 1 bedside commode    Recommendations for Other Services Rehab consult    Precautions / Restrictions Precautions Precautions: Fall Precaution Comments: external fixator R LE  Restrictions Weight Bearing Restrictions: Yes RLE Weight Bearing: Non weight bearing       Mobility Bed Mobility Overal bed mobility: Independent             General bed mobility comments: pt OOB in chair upon arrival  Transfers Overall transfer level: Needs assistance Equipment used: Rolling walker (2 wheeled) Transfers: Sit to/from Stand Sit to Stand: Min assist         General transfer comment: cues for hand placement    Balance Overall balance assessment: Needs assistance Sitting-balance support: No upper extremity supported Sitting balance-Leahy Scale: Good     Standing balance support: Bilateral upper extremity supported;During functional activity Standing balance-Leahy Scale: Poor Standing balance comment: reliance on RW                           ADL either performed or assessed with clinical judgement   ADL Overall ADL's : Needs assistance/impaired Eating/Feeding: Set up   Grooming: Wash/dry hands;Wash/dry face;Set up;Sitting Grooming Details (indicate cue type and reason): pt requires seated position to keep precautsion                 Toilet Transfer: Minimal  assistance;RW Toilet Transfer Details (indicate cue type and reason): pt requires rest breaks and placing R LE on the ground. pt instructed NWB with external fixator. PA Glen Colon educated patient on the risk factors on further injury with external fixator on OT arrival this session. pt expressed understanding but demonstration is lacking.          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 pt states sister has a hover round from disabled mother at her house     Vision       Perception     Praxis      Cognition Arousal/Alertness: Awake/alert Behavior During Therapy: [facility] for tasks assessed/performed Overall Cognitive Status: Impaired/Different from baseline Area of Impairment: Safety/judgement;Awareness                         Safety/Judgement: Decreased awareness of deficits;Decreased awareness of safety Awareness: Anticipatory   General Comments: Glen Colon plans to drive his lawnmower to the store however has not problem solved the fact the mowers brakes are on the Right side. pt can not safely start or break due to R LE external fixator. pt states he has two trucks at home he can take too. Pt reports sister and brother can (A) upon d/c home and a son that lives near by. pt has not had a direct conversation regarding  needs with family per report this session. pt encouraged to have sister come to [facility] to have education on wound dressing.         Exercises     Shoulder Instructions       General Comments pt educated to have sister visit for wound management education for external fixator    Pertinent Vitals/ Pain       Pain Assessment: No/denies pain Faces Pain Scale: Hurts a little bit Pain Location: R LE in dependent position Pain Descriptors / Indicators: Throbbing;Aching Pain Intervention(s): Monitored during session;Limited activity within  patient's tolerance;Premedicated before session;Repositioned  Home Living                                          Prior Functioning/Environment              Frequency  Min 2X/week        Progress Toward Goals  OT Goals(current goals can now be found in the care plan section)  Progress towards OT goals: Progressing toward goals  Acute Rehab OT Goals Patient Stated Goal: to return home asap OT Goal Formulation: With patient Time For Goal Achievement: 10/28/17 Potential to Achieve Goals: Good ADL Goals Pt Will Transfer to Toilet: with set-up;ambulating;bedside commode Pt Will Perform Tub/Shower Transfer: Tub transfer;3 in 1;rolling walker;with supervision Additional ADL Goal #1: Pt will complete bed mobility supervision level for adls  Plan Discharge plan needs to be updated    Co-evaluation                 AM-PAC PT "6 Clicks" Daily Activity     Outcome Measure   Help from another person eating meals?: None Help from another person taking care of personal grooming?: A Little Help from another person toileting, which includes using toliet, bedpan, or urinal?: A Lot Help from another person bathing (including washing, rinsing, drying)?: A Lot Help from another person to put on and taking off regular upper body clothing?: A Lot Help from another person to put on and taking off regular lower body clothing?: A Lot 6 Click Score: 15    End of Session Equipment Utilized During Treatment: Gait belt;Rolling walker  OT Visit Diagnosis: Unsteadiness on feet (R26.81);Pain Pain - Right/Left: Right Pain - part of body: Leg   Activity Tolerance Patient tolerated treatment well   Patient Left in chair;with call bell/phone within reach;with nursing/sitter in room   Nurse Communication Mobility status;Precautions;Weight bearing status        Time: 5852-7782 OT Time Calculation (min): 22 min  Charges: OT General Charges $OT Visit: 1 Visit OT  Treatments $Self Care/Home Management : 8-22 mins   Jeri Modena   OTR/L Pager: [phone] Office: [phone] .    Parke Poisson B 10/17/2017, 12:53 PM

## 2017-10-17 NOTE — Consult Note (Signed)
Physical Medicine and Rehabilitation Consult  Reason for Consult: Functional deficits.  Referring Physician:  Dr. Marcelino Scot.    HPI: Glen MICHALEC is a 67 y.o. male with history of cirrhosis of liver, seizure disorder, alcohol and marijuana abuse who fell off a porch while intoxicated on 10/11/17 with subsequent open right pilon fracture.  History taken from chart review and patient.  He was underwent I and D with placement of external fixator and transferred to [facility] for treatment. He underwent repeat I and D right tibia/fibula with revision of external fixator and ORIF right tibia on 10/13/17  by Dr. Marcelino Scot. Post op NWB RLE.  He has had issues with constipation, hypoxia with activity as well as deficits in functional status. CIR recommended for follow up therapy.   Review of Systems  Constitutional: Negative for chills, fever and malaise/fatigue.  HENT: Negative for hearing loss and tinnitus.   Eyes: Negative for blurred vision and double vision.  Respiratory: Positive for cough. Negative for shortness of breath.   Cardiovascular: Negative for chest pain and palpitations.  Gastrointestinal: Negative for heartburn and nausea.  Genitourinary: Negative for dysuria and urgency.  Musculoskeletal: Positive for joint pain.  Neurological: Positive for dizziness. Negative for sensory change, speech change, focal weakness, weakness and headaches.  Psychiatric/Behavioral: The patient is not nervous/anxious.   All other systems reviewed and are negative.    Past Medical History:  Diagnosis Date  . Alcohol dependence ([place]) 10/13/2017  . Cirrhosis ([place]) 10/13/2017  . Epilepsy ([place])   . Hepatitis C, chronic ([place]) 10/13/2017  . Marijuana abuse 10/13/2017  . Pneumonia   . Tobacco abuse     Past Surgical History:  Procedure Laterality Date  . LIVER CYST REMOVAL    . [place], "walking pna"  . ORIF ANKLE FRACTURE Right 10/11/2017   Procedure: I & D right ankle with  application of external fixator and wound vac;  Surgeon: Hessie Knows, MD;  Location: [facility] ORS;  Service: Orthopedics;  Laterality: Right;    No pertinent family history of trauma.    Social History:  Lives alone. Sister to check on patient after discharge. He reports that he has been smoking cigarettes--1/2 PPD.  He has a 69.00 pack-year smoking history. he has never used smokeless tobacco. He reports that he drinks alcohol. He reports that he uses drugs. Drug: Marijuana.    Allergies  Allergen Reactions  . Codeine Nausea And Vomiting and Other (See Comments)    CHILLS & SWEATS "DEATHLY ILL" per Pt description   Medications Prior to Admission  Medication Sig Dispense Refill  . alprazolam (XANAX) 2 MG tablet Take 1 mg by mouth 2 (two) times daily.    Marland Kitchen PHENObarbital (LUMINAL) 100 MG tablet Take 100 mg by mouth at bedtime.     Marland Kitchen acetaminophen (TYLENOL) 650 MG suppository Place 1 suppository (650 mg total) rectally every 6 (six) hours as needed for mild pain (or Fever >/= 101). 12 suppository 0  . bisacodyl (DULCOLAX) 10 MG suppository Place 1 suppository (10 mg total) rectally daily as needed for moderate constipation. 12 suppository 0  . ceFAZolin (ANCEF) 2-4 GM/100ML-% IVPB Inject 100 mLs (2 g total) into the vein every 8 (eight) hours. 1 each 0  . docusate sodium (COLACE) 100 MG capsule Take 1 capsule (100 mg total) by mouth 2 (two) times daily. 10 capsule 0  . folic acid (FOLVITE) 1 MG tablet Take 1 tablet (1 mg total)  by mouth daily. 30 tablet 0  . gentamicin 570 mg in dextrose 5 % 100 mL Inject 570 mg into the vein daily. 1 ampule 0  . LORazepam (ATIVAN) 1 MG tablet Take 1 tablet (1 mg total) by mouth every 6 (six) hours as needed (CIWA-AR > 8  -OR-  withdrawal symptoms:  anxiety, agitation, insomnia, diaphoresis, nausea, vomiting, tremors, tachycardia, or hypertension.). 30 tablet 0  . LORazepam (ATIVAN) 2 MG tablet Take 0-2 tablets (0-4 mg total) by mouth every 6 (six) hours. 30  tablet 0  . magnesium citrate SOLN Take 296 mLs (1 Bottle total) by mouth once as needed for severe constipation. 195 mL 0  . magnesium hydroxide (MILK OF MAGNESIA) 400 MG/5ML suspension Take 30 mLs by mouth daily as needed for mild constipation. 360 mL 0  . methocarbamol (ROBAXIN) 500 MG tablet Take 1 tablet (500 mg total) by mouth every 6 (six) hours as needed for muscle spasms. 30 tablet 0  . metoCLOPramide (REGLAN) 5 MG tablet Take 1-2 tablets (5-10 mg total) by mouth every 8 (eight) hours as needed for nausea (if ondansetron (ZOFRAN) ineffective.). 30 tablet 0  . morphine 2 MG/ML injection Inject 1 mL (2 mg total) into the vein every hour as needed. 1 mL 0  . Multiple Vitamin (MULTIVITAMIN WITH MINERALS) TABS tablet Take 1 tablet by mouth daily. 30 tablet 0  . ondansetron (ZOFRAN) 4 MG tablet Take 1 tablet (4 mg total) by mouth every 6 (six) hours as needed for nausea. 20 tablet 0  . oxyCODONE (OXY IR/ROXICODONE) 5 MG immediate release tablet Take 1 tablet (5 mg total) by mouth every 3 (three) hours as needed for moderate pain ((score 4 to 6)). 30 tablet 0  . oxyCODONE 10 MG TABS Take 1 tablet (10 mg total) by mouth every 3 (three) hours as needed for severe pain ((score 7 to 10)). 30 tablet 0  . thiamine 100 MG tablet Take 1 tablet (100 mg total) by mouth daily. 30 tablet 0  . zolpidem (AMBIEN) 5 MG tablet Take 1 tablet (5 mg total) by mouth at bedtime as needed and may repeat dose one time if needed for sleep. 30 tablet 0    Home: Home Living Family/patient expects to be discharged to:: Private residence Living Arrangements: Alone Available Help at Discharge: Family, Available PRN/intermittently Type of Home: House Home Access: Stairs to enter Technical brewer of Steps: 2 Home Layout: One level Bathroom Shower/Tub: Chiropodist: Standard Home Equipment: Crutches Additional Comments: dog named Rosco lives outside  Functional History: Prior Function Level of  Independence: Independent Comments: sister is retired Therapist, sports (2x retired from [employer]) lives 10 minutes away and brother lives 2 houses away Functional Status:  Mobility: [place] bed mobility: Independent Bed Mobility: Supine to Sit Supine to sit: Min assist, Min guard General bed mobility comments: pt OOB in chair upon arrival Transfers Overall transfer level: Needs assistance Equipment used: Rolling walker (2 wheeled) Transfers: Sit to/from Stand Sit to Stand: Min assist General transfer comment: cues for hand placement Ambulation/Gait Ambulation/Gait assistance: Min assist Ambulation Distance (Feet): (~35ft X2) Assistive device: Rolling walker (2 wheeled)(wide) Gait Pattern/deviations: Step-to pattern General Gait Details: pt demonstrated safe use of AD; LOB X1 when turning; assist for balance at times; pt required rest break every 16-5ft due to SOB; SpO2 desat to 86% on RA while ambulating and up to 94% with rest and PLB Gait velocity: decreased    ADL: ADL Overall ADL's : Needs assistance/impaired  Eating/Feeding: Set up Grooming: Wash/dry hands, Wash/dry face, Set up, Sitting Grooming Details (indicate cue type and reason): pt requires seated position to keep precautsion Upper Body Bathing: Set up Lower Body Bathing: Moderate assistance Upper Body Dressing : Set up Lower Body Dressing: Moderate assistance Toilet Transfer: Minimal assistance, RW Toilet Transfer Details (indicate cue type and reason): pt requires rest breaks and placing R LE on the ground. pt instructed NWB with external fixator. PA Ainsley Spinner educated patient on the risk factors on further injury with external fixator on OT arrival this session. pt expressed understanding but demonstration is lacking.  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 pt states sister has a hover round from  disabled mother at her house  Cognition: Cognition Overall Cognitive Status: Impaired/Different from baseline Orientation Level: Oriented X4 Cognition Arousal/Alertness: Awake/alert Behavior During Therapy: WFL for tasks assessed/performed Overall Cognitive Status: Impaired/Different from baseline Area of Impairment: Safety/judgement, Awareness Safety/Judgement: Decreased awareness of deficits, Decreased awareness of safety Awareness: Anticipatory General Comments: Mr Corlew plans to drive his lawnmower to the store however has not problem solved the fact the mowers brakes are on the Right side. pt can not safely start or break due to R LE external fixator. pt states he has two trucks at home he can take too. Pt reports sister and brother can (A) upon d/c home and a son that lives near by. pt has not had a direct conversation regarding needs with family per report this session. pt encouraged to have sister come to [facility] to have education on wound dressing.   Blood pressure 121/69, pulse 75, temperature 97.7 F (36.5 C), temperature source Oral, resp. rate 17, SpO2 98 %. Physical Exam  Vitals reviewed. Constitutional: He is oriented to person, place, and time. He appears well-developed and well-nourished. No distress.  HENT:  Head: Normocephalic and atraumatic.  Mouth/Throat: Oropharynx is clear and moist.  Eyes: Conjunctivae and EOM are normal. Pupils are equal, round, and reactive to light. Right eye exhibits no discharge. Left eye exhibits no discharge.  Neck: Normal range of motion. Neck supple.  Cardiovascular: Normal rate and regular rhythm.  No murmur heard. Respiratory: Effort normal and breath sounds normal. No stridor. No respiratory distress. He has no wheezes.  GI: Bowel sounds are normal. He exhibits distension. There is no tenderness.  Musculoskeletal: He exhibits edema and tenderness.  Bloody drainage from pin sites with ace wrap along left ankle.    Neurological: He is  alert and oriented to person, place, and time.  Speech clear.  Able to follow simple commands without difficulty.  Motor: Bilateral upper extremities 5/5 proximal to distal Left lower extremity: 5/5 proximal distal Right lower extremity: Hip flexion 4+/5, knee extension 4+/5, wiggles toes Sensation intact light touch  Skin: Skin is warm and dry. He is not diaphoretic.  Psychiatric: He has a normal mood and affect.  Extremely perseverative on finding the cost of CIR    No results found for this or any previous visit (from the past 24 hour(s)). No results found.  Assessment/Plan: Diagnosis: Debility Labs independently reviewed.  Records reviewed and summated above.  1. Does the need for close, 24 hr/day medical supervision in concert with the patient's rehab needs make it unreasonable for this patient to be served in a less intensive setting? Potentially  2. Co-Morbidities requiring supervision/potential complications: cirrhosis of liver (avoid hepatotoxic meds), seizure disorder (continue meds),  alcohol and marijuana abuse (CIWA, counsel), constipation (adjust bowel regimen as necessary), hypoxia (monitor O2 sats and respiratory rate with increased physical exertion), tobacco abuse (counsel), hyponatremia (continue to monitor, treat if necessary), acute blood loss anemia (transfuse if necessary to ensure appropriate perfusion for increased activity tolerance), Thrombocytopenia (< 60,000/mm3 no resistive exercise, < 20,000/mm3 AROM, maybe walking) 3. Due to safety, skin/wound care, disease management, pain management and patient education, does the patient require 24 hr/day rehab nursing? Yes 4. Does the patient require coordinated care of a physician, rehab nurse, PT (1-2 hrs/day, 5 days/week) and OT (1-2 hrs/day, 5 days/week) to address physical and functional deficits in the context of the above medical diagnosis(es)? Potentially Addressing deficits in the following areas: balance, endurance,  locomotion, transferring, bathing, dressing, toileting and psychosocial support 5. Can the patient actively participate in an intensive therapy program of at least 3 hrs of therapy per day at least 5 days per week? No 6. The potential for patient to make measurable gains while on inpatient rehab is good 7. Anticipated functional outcomes upon discharge from inpatient rehab are modified independent and supervision  with PT, modified independent and supervision with OT, n/a with SLP. 8. Estimated rehab length of stay to reach the above functional goals is: 10-15 days. 9. Anticipated D/C setting: Other 10. Anticipated post D/C treatments: SNF. 11. Overall Rehab/Functional Prognosis: good  RECOMMENDATIONS: This patient's condition is appropriate for continued rehabilitative care in the following setting: Pt with poor activity tolerance, which is most limiting factor.  He is most appropriate for SNF at this time as his ex-fix will be in place for the near future. Further, he states he needs to take care of things at home and wants to leave the hospital today and potentially return for rehab in the future. Patient has agreed to participate in recommended program. Potentially Note that insurance prior authorization may be required for reimbursement for recommended care.  Comment: Rehab Admissions Coordinator to follow up.  Delice Lesch, MD, ABPMR Bary Leriche, PA-C 10/17/2017

## 2017-10-17 NOTE — Op Note (Signed)
NAME:  Glen Colon, Glen Colon                      ACCOUNT NO.:  MEDICAL RECORD NO.:  [id number]  LOCATION:                                 FACILITY:  PHYSICIAN:  Astrid Divine. Marcelino Scot, M.D. DATE OF BIRTH:  10/18/1950  DATE OF PROCEDURE:  10/13/2017 DATE OF DISCHARGE:                              OPERATIVE REPORT   PREOPERATIVE DIAGNOSES: 1. Right grade 3A open tibia-fibula fracture. 2. Status post provisional external fixation.  POSTOPERATIVE DIAGNOSES: 1. Right grade 2 open tibia-fibula fracture. 2. Status post provisional external fixation.  PROCEDURE: 1. Repeat irrigation and debridement of right open tibia and fibula     including removal of bone. 2. Revision of external fixator, right ankle. 3. Open reduction and internal fixation of left tibia fracture. 4. Intramedullary nailing of the right fibula with Arthrex nail. 5. Application of small wound VAC. 6. Complex closure of 10 cm open wound.  SURGEON:  Astrid Divine. Marcelino Scot, M.D.  ASSISTANT:  Jari Pigg, PA  ANESTHESIA:  General.  COMPLICATIONS:  None.  ESTIMATED BLOOD LOSS:  150 mL.  SPECIMENS:  None.  TOURNIQUET:  None.  DISPOSITION:  To PACU.  CONDITION:  Stable.  BRIEF SUMMARY OF INDICATION FOR PROCEDURE:  Glen Colon is a 67 year old male with a complex medical history including hepatitis C and cirrhosis for which he has been considered medical treatment but because of persistent alcohol use has not been a suitable candidate as determined by both at [facility] and [facility].  The patient was initially admitted to [facility] where Dr. Hessie Knows performed I and D of the bone open wound on October 11, 2017, and placement of a provisional fixator.  Because of the complexity of both the injury and the patient, Dr. Rudene Christians asserted this was outside the scope of practice, and these injuries to be best managed by fellowship trained orthopedic traumatologist.  Consequently, we accepted the patient and discussed with  him the risks and benefits of treatment including potential for limb loss in the event of deep infection, bleeding, nerve injury, vessel injury, malunion, nonunion, DVT, and PE as well as the need for further procedures.  After a full discussion, he did wish to proceed.  BRIEF SUMMARY OF PROCEDURE:  The patient taken to the operating room where general anesthesia was induced.  His right lower extremity was prepped and draped in usual sterile fashion.  I removed the fixator to allow for delivery of the bone ends to the traumatic wound.  I did extend vertical limbs anteromedially and posteriorly on the proximal side.  This enabled me to perform adequate debridement.  Specifically distally, there was an area of cortical bone that was resting on the physeal scar, that was extracted.  I used 6000 mL of saline Pulsavac, supplemented with soap lavage, and I carefully protected the skin edges throughout.  With a scalpel, I excised necrotic contaminated wound edges of the epidermis, underlying subcutaneous fat, and muscle fascia in addition to the cortical bone.  Once this was completely clean, it was then reduced through the open incision.  Fresh attire and drapes were applied.  The medial side did have an outer  cortex flap of bone that was connected to periosteum and was booked proximally on this hinge.  I was able to preserve this bone, and it did have again at least 80% of periosteal attachment.  Ainsley Spinner, PA-C, assisted me throughout this portion of procedure with delivery of the bone ends, protection of the tissues, and removal of the fixator.  We then turned our attention to the fibula where we hope to regain length, rotation, and alignment.  A 2 cm incision was made below the tip of the fibula.  The starting guidepin was advanced just medial to the tip.  This was prepared with a 2.5 drill, followed by advancement of the K-wire, then use of the starting reamer.  The guide pin was  then advanced across the fracture site and into the proximal fibula.  It was then reamed with a 3.2 mm reamer.  We then placed the fibular nail from Arthrex, which measured 3 x 180 mm.  We deployed the tunnelings proximally and placed all 3 locking screws distally.  We were careful to control rotation throughout and to protect soft tissues as well.  Ainsley Spinner, PA, performed this while I did the instrumentation and his assistance was necessary for effective completion.  Wounds were irrigated and then closed.  Attention was then turned to the medial side.  Here, the fracture could be open reduced with use of the fixator and because of the hinged periosteum and his poor bone quality, I was also able to simply suture the tibia and in particular to incorporate this fragment such that it stayed in a reduced position.  I performed this with 2 #1 PDS figure-of- eight sutures passing directly through the bone around the periosteum to fix the reduction.  The reduction held while we performed a careful layered closure of this traumatic wound, which now measured a total of 10 cm.  Retention sutures were required using far-near near-far technique in addition to the 2-0 subcutaneous PDS sutures.  Once this wound had been repaired, we then applied the fixator.  We did add pins and bars to the metatarsals as we wished to minimize any motion at the open wound and to maintain the foot in a plantigrade position as prolonged use of the fixator was anticipated.  I did drill 1 cortex which I felt may be eccentric within the fifth metatarsal and simply went beyond this to place an additional pin that would be centered within the bone to reduce risk of fracture through an eccentric pin. Final images showed appropriate position and length of all screws.  I then applied a wound VAC to the open wound, and I padded the hose and with Kerlix gauze, Mepitel, and Ace wraps.  The patient was taken to PACU in stable  condition.  Again, Jari Pigg, PA-C assisted me throughout with all portions of the procedure.  PROGNOSIS:  Mr. Gabay will be strictly nonweightbearing.  He is at exceedingly high risk for complications given not only his injury and underlying medical health but also the social habits and alcohol abuse. This would be expected to result in delayed union at least with the possibility of nonunion, deep infection, and again limb loss.  We do not anticipate pharmacologic DVT prophylaxis because of his thrombocytopenia at this time but will be encouraged with early toe motion and elevation.  He will have mechanical prophylaxis on the contralateral side as well.     Astrid Divine. Marcelino Scot, M.D.     MHH/MEDQ  D:  10/16/2017  T:  10/16/2017  Job:  [id number]

## 2017-10-18 ENCOUNTER — Encounter (HOSPITAL_COMMUNITY): Payer: Self-pay | Admitting: Orthopedic Surgery

## 2017-10-18 DIAGNOSIS — F1210 Cannabis abuse, uncomplicated: Secondary | ICD-10-CM

## 2017-10-18 DIAGNOSIS — D62 Acute posthemorrhagic anemia: Secondary | ICD-10-CM | POA: Diagnosis present

## 2017-10-18 DIAGNOSIS — K5903 Drug induced constipation: Secondary | ICD-10-CM | POA: Diagnosis not present

## 2017-10-18 DIAGNOSIS — E291 Testicular hypofunction: Secondary | ICD-10-CM

## 2017-10-18 DIAGNOSIS — Z720 Tobacco use: Secondary | ICD-10-CM

## 2017-10-18 DIAGNOSIS — T148XXA Other injury of unspecified body region, initial encounter: Secondary | ICD-10-CM

## 2017-10-18 DIAGNOSIS — E559 Vitamin D deficiency, unspecified: Secondary | ICD-10-CM

## 2017-10-18 DIAGNOSIS — E871 Hypo-osmolality and hyponatremia: Secondary | ICD-10-CM | POA: Diagnosis present

## 2017-10-18 DIAGNOSIS — K7030 Alcoholic cirrhosis of liver without ascites: Secondary | ICD-10-CM

## 2017-10-18 DIAGNOSIS — R0902 Hypoxemia: Secondary | ICD-10-CM

## 2017-10-18 DIAGNOSIS — D696 Thrombocytopenia, unspecified: Secondary | ICD-10-CM

## 2017-10-18 DIAGNOSIS — B182 Chronic viral hepatitis C: Secondary | ICD-10-CM

## 2017-10-18 HISTORY — DX: Testicular hypofunction: E29.1

## 2017-10-18 HISTORY — DX: Vitamin D deficiency, unspecified: E55.9

## 2017-10-18 LAB — BASIC METABOLIC PANEL
ANION GAP: 13 (ref 5–15)
BUN: 8 mg/dL (ref 6–20)
CALCIUM: 8.1 mg/dL — AB (ref 8.9–10.3)
CO2: 21 mmol/L — ABNORMAL LOW (ref 22–32)
CREATININE: 0.85 mg/dL (ref 0.61–1.24)
Chloride: 99 mmol/L — ABNORMAL LOW (ref 101–111)
GFR calc non Af Amer: >60 mL/min (ref >60)
Glucose, Bld: 100 mg/dL — ABNORMAL HIGH (ref 65–99)
Potassium: 3.6 mmol/L (ref 3.5–5.1)
SODIUM: 133 mmol/L — AB (ref 135–145)

## 2017-10-18 LAB — TESTOSTERONE, % FREE: Testosterone-% Free: 0.8 % — ABNORMAL HIGH (ref 0.2–0.7)

## 2017-10-18 LAB — CBC
HCT: 34.8 % — ABNORMAL LOW (ref 39.0–52.0)
Hemoglobin: 11.7 g/dL — ABNORMAL LOW (ref 13.0–17.0)
MCH: 35 pg — ABNORMAL HIGH (ref 26.0–34.0)
MCHC: 33.6 g/dL (ref 30.0–36.0)
MCV: 104.2 fL — ABNORMAL HIGH (ref 78.0–100.0)
Platelets: 67 10*3/uL — ABNORMAL LOW (ref 150–400)
RBC: 3.34 MIL/uL — ABNORMAL LOW (ref 4.22–5.81)
RDW: 15.9 % — AB (ref 11.5–15.5)
WBC: 6.8 10*3/uL (ref 4.0–10.5)

## 2017-10-18 MED ORDER — ASCORBIC ACID 500 MG PO TABS: 500.0000 mg | ORAL_TABLET | Freq: Every day | ORAL | 1 refills | Status: DC

## 2017-10-18 MED ORDER — OXYCODONE HCL 5 MG PO TABS: 5.0000 mg | ORAL_TABLET | Freq: Four times a day (QID) | ORAL | 0 refills | Status: DC | PRN

## 2017-10-18 MED ORDER — LORAZEPAM 1 MG PO TABS: 1.0000 mg | ORAL_TABLET | Freq: Four times a day (QID) | ORAL | Status: DC | PRN

## 2017-10-18 MED ORDER — DOCUSATE SODIUM 100 MG PO CAPS: 100.0000 mg | ORAL_CAPSULE | Freq: Two times a day (BID) | ORAL | 0 refills | Status: DC

## 2017-10-18 MED ORDER — ADULT MULTIVITAMIN W/MINERALS CH: 1.0000 | ORAL_TABLET | Freq: Every day | ORAL | 2 refills | Status: DC

## 2017-10-18 MED ORDER — CHOLECALCIFEROL 1.25 MG (50000 UT) PO TABS: 5000.0000 [IU] | ORAL_TABLET | Freq: Every day | ORAL | 3 refills | Status: DC

## 2017-10-18 MED ORDER — LORAZEPAM 2 MG/ML IJ SOLN: 1.0000 mg | Freq: Four times a day (QID) | INTRAMUSCULAR | Status: DC | PRN

## 2017-10-18 MED ORDER — POLYETHYLENE GLYCOL 3350 17 G PO PACK: 17.0000 g | PACK | Freq: Every day | ORAL | 0 refills | Status: DC

## 2017-10-18 MED ORDER — CALCIUM CITRATE 950 (200 CA) MG PO TABS: 200.0000 mg | ORAL_TABLET | Freq: Two times a day (BID) | ORAL | 2 refills | Status: DC

## 2017-10-18 MED ORDER — METHOCARBAMOL 500 MG PO TABS: 500.0000 mg | ORAL_TABLET | Freq: Four times a day (QID) | ORAL | 0 refills | Status: DC | PRN

## 2017-10-18 NOTE — Plan of Care (Signed)
  Nutrition: Adequate nutrition will be maintained 10/18/2017 1047 - Progressing by Williams Che, RN   Coping: Level of anxiety will decrease 10/18/2017 1047 - Progressing by Williams Che, RN   Elimination: Will not experience complications related to bowel motility 10/18/2017 1047 - Progressing by Williams Che, RN   Safety: Ability to remain free from injury will improve 10/18/2017 1047 - Progressing by Williams Che, RN

## 2017-10-18 NOTE — Discharge Summary (Signed)
Orthopaedic Trauma Service (OTS)  Patient ID: Glen Colon MRN: [id number] DOB/AGE: Apr 06, 1951 67 y.o.  Admit date: 10/12/2017 Discharge date: 10/18/2017  Admission Diagnoses: Open Right distal tibia and fibula fracture, grade 2 Nicotine dependence  Marijuana abuse EtOH abuse Cirrhosis Hepatitis C  Chronic hyponatremia  Chronic thrombocytopenia   Discharge Diagnoses:  Active Problems:   Tobacco abuse   Open pilon fracture of tibia   Marijuana abuse   Alcohol dependence (HCC)   Cirrhosis (HCC)   Hepatitis C, chronic (HCC)   Hyponatremia   Acute blood loss anemia   Thrombocytopenia (HCC)   Vitamin D deficiency   Hypogonadism in male   Past Medical History:  Diagnosis Date  . Alcohol dependence ([place]) 10/13/2017  . Cirrhosis ([place]) 10/13/2017  . Epilepsy ([place])   . Hepatitis C, chronic ([place]) 10/13/2017  . Hypogonadism in male 10/18/2017  . Marijuana abuse 10/13/2017  . Pneumonia   . Tobacco abuse   . Vitamin D deficiency 10/18/2017     Procedures Performed:  10/11/2017- Dr. Rudene Christians I&D R anke Application of external fixator R ankle Wound VAC R ankle   10/13/2016- Dr. Marcelino Scot  1. Repeat irrigation and debridement of right open tibia and fibula     including removal of bone. 2. Revision of external fixator, right ankle. 3. Intramedullary nailing of the right fibula with Arthrex nail. 4. Application of small wound VAC. 5. Complex closure of 10 cm open wound   Discharged Condition: stable  Hospital Course:        [name] is a 67 year old white male who was admitted to the [facility] system on 10/11/2017.  He was initially seen and evaluated at [facility] after he sustained a fall off of a porch while intoxicated.  Patient sustained an open fracture to his right ankle.  He was taken to the operating room for irrigation debridement as well as application of a spanning fixator.  Due to the complexity injury the patient was transferred to the orthopedic trauma service at [facility].  We returned to the operating room on 10/13/2016 for revision of his external fixator, IM nailing of his right fibula and repeat I&D of his open fracture.  Patient's hospital stay was relatively uncomplicated.  He does have a long-standing history of alcohol abuse marijuana abuse.  He was started on withdrawal protocol on transfer to [facility].  He did not exhibit any signs or symptoms of withdrawal during his stay.  He was slow to mobilize with therapy.  Due to his chronic thrombocytopenia he did not receive any anticoagulation.  He was covered with Ancef for 48 hours after definitive closure for his open fracture treatment.  Patient progressed well but slowly with therapies.  We felt that patient would require a skilled nursing however he was adamant about not giving up his Medicaid check pay for his nursing facility stay.  Patient stated couple more nights to work with therapy.  On postoperative day #5 following his external fixator revision and repeat I&D patient was deemed to be stable for discharge.  Patient is mobilizing well.  Tolerating regular diet, voiding without difficulty and had a bowel movement.  We did attempt to obtain a inpatient rehab consult however he was deemed not to be a candidate.  We did review extensively his wound care as well as his external fixator care.  I am cautiously optimistic that the patient will do well.  If he resumes his regular pattern of alcohol use along with his regular smoking habits  nicotine and marijuana he will most likely become infected as the area of his wound is quite tenuous over the medial aspect of his ankle.  Should he become infected this could be a limb threatening situation and could also be a life-threatening situation as well.  We did discuss one last time prior to his departure whether or not he would be willing to reconsider going to a nursing home but again does not want to give up any of his Medicaid check to do so.  Patient discharged on  10/18/2017.  He will follow-up with orthopedics in 1 week for skin check.  Patient will remain in his fixator for approximately 8 weeks.  We will remove the fixator at that time and then place him into a cast or cam boot which he will be nonweightbearing for another 2 weeks and then gradually begin weightbearing thereafter.  During his hospital stay we also did to evaluate his bone health.  He does have vitamin D deficiency as well as testosterone deficiency.  He was started on vitamin D supplementation along with vitamin C.  Calcium citrate was also started as well.  Suspect that his endocrinopathies are related to marijuana and chronic alcohol use.  Consults: rehabilitation medicine  Significant Diagnostic Studies: labs:  Results for Glen, Colon (MRN [id number]) as of 10/18/2017 10:57  Ref. Range 10/15/2017 06:14  PTH, Intact Latest Ref Range: 15 - 65 pg/mL 30  Calcium, Total (PTH) Latest Ref Range: 8.6 - 10.2 mg/dL 7.5 (L)  PTH Interp Unknown Comment  TSH Latest Ref Range: 0.350 - 4.500 uIU/mL 3.873   Results for Glen, Colon (MRN [id number]) as of 10/18/2017 10:57  Ref. Range 10/13/2017 08:28  Vit D, 1,25-Dihydroxy Latest Ref Range: 19.9 - 79.3 pg/mL 16.3 (L)  Vitamin D, 25-Hydroxy Latest Ref Range: 30.0 - 100.0 ng/mL 13.7 (L)  Results for Glen, Colon (MRN [id number]) as of 10/18/2017 10:57  Ref. Range 10/13/2017 08:28  Sex Horm Binding Glob, Serum Latest Ref Range: 19.3 - 76.4 nmol/L 77.2 (H)  Testosterone Latest Ref Range: 264 - 916 ng/dL 31 (L)  Testosterone Free Latest Ref Range: 6.6 - 18.1 pg/mL 1.0 (L)    Treatments: IV hydration, antibiotics: Ancef, analgesia: Dilaudid and Oxy IR, anticoagulation: anticoagulation held due to chronic thrombocytopenia, therapies: PT, OT, RN and SW and surgery: as above   Discharge Exam:       Orthopedic Trauma Service Progress Note    Patient ID: Glen Colon MRN: [id number] DOB/AGE: 11-12-1950 67 y.o.   Subjective:   No acute issues    Really perseverating on the fact that he may be required to give up his medicaid check if he goes to inpatient rehab    Thinks he can manage at home but unsure    No other issues noted      ROS As above   Objective:    VITALS:   Vitals:    10/17/17 0605 10/17/17 1315 10/17/17 2000 10/18/17 0558  BP: 112/61 121/69 138/66 (!) 125/50  Pulse: 78 75 86 (!) 55  Resp: 18 17 18 18   Temp: 98 F (36.7 C) 97.7 F (36.5 C) 98.6 F (37 C) 98.5 F (36.9 C)  TempSrc: Oral Oral Oral Oral  SpO2: 93% 98% 95% 97%      Estimated body mass index is 29.41 kg/m as calculated from the following:   Height as of 10/11/17: 5\' 10"  (1.778 m).   Weight as of 10/11/17: 93 kg (205 lb).  Intake/Output      01/21 0701 - 01/22 0700 01/22 0701 - 01/23 0700   P.O. 1080    Total Intake 1080    Urine 3825 300   Stool 1    Total Output 3826 300   Net -2746 -300        Stool Occurrence 1 x       LABS   Lab Results Last 24 Hours  No results found for this or any previous visit (from the past 24 hour(s)).       PHYSICAL EXAM:    Gen: resting comfortably in bed, NAD  Lungs: breathing unlabored Cardiac: Regular Ext:       Right Lower Extremity              Drainage decreasing              Dressing changed today              Traumatic medial ankle wound stable                         Significant ecchymosis                         No odor                          No purulent drainage                         Edges are approximated                          Overall wound looks stable              Ext warm              Swelling controlled             + DP pulse              Distal motor and sensory functions intact    Assessment/Plan: 5 Days Post-Op    Active Problems:   Open pilon fracture of tibia   Marijuana abuse   Alcohol dependence (HCC)   Cirrhosis (HCC)   Hepatitis C, chronic (HCC)   Fracture   Drug-induced constipation   Hypoxia   Hyponatremia   Acute blood loss anemia    Thrombocytopenia (HCC)               Anti-infectives (From admission, onward)    Start     Dose/Rate Route Frequency Ordered Stop    10/13/17 2030   ceFAZolin (ANCEF) IVPB 1 g/50 mL premix     1 g 100 mL/hr over 30 Minutes Intravenous Every 8 hours 10/13/17 1504 10/15/17 1229    10/13/17 0430   ceFAZolin (ANCEF) IVPB 1 g/50 mL premix  Status:  Discontinued     1 g 100 mL/hr over 30 Minutes Intravenous Every 8 hours 10/12/17 2107 10/13/17 1504     .   POD/HD#: 37   67 year old white male fall off of a porch with a open right tibial plafond fracture and right distal fibula fracture, transferred from [facility]   - Fall off porch    -Open right tibial plafond fracture and right fibula fracture s/p irrigation and debridement, IMN R fibula, ex fix revision R tibia  NWB x 8 weeks             Ex fix will be definitive means of treatment, will be in ex fix 6-8 weeks               Pt has reconsidered rehab facility             not a candidate for CIR per consult                Pt at high risk for complications including soft tissue breakdown, infection which could lead to loss of limb                 Dressings changes daily, including pin care             dressing changed today              Ok to get in shower and clean R leg with soap and water, then place new dressing                PT/OT             Ice and elevate               Pin care as needed                           Pin Site Instructions   Dress pins daily with Kerlix roll starting on POD 2. Wrap the Kerlix so that it tamps the skin down around the pin-skin interface to prevent/limit motion of the skin relative to the pin.  (Pin-skin motion is the primary cause of pain and infection related to external fixator pin sites).   Remove any crust or coagulum that may obstruct drainage with a saline moistened gauze or soap and water.   After POD 3, if there is no discernable drainage on the pin site dressing, the interval  for change can by increased to every other day.   may shower with the fixator, cleaning all pin sites gently with soap and water.  If you have a surgical wound this needs to be completely dry and without drainage before showering.   The extremity can be lifted by the fixator to facilitate wound care and transfers.       - Pain management:            continue with current regimen    - ABL anemia/Hemodynamics             stable    - Medical issues              Epilepsy-Home medications               History of hepatitis C, transaminitis-chronic                         HIV test is negative                         + hep c                 Hyponatremia                         likely chronically low due to chronic EtOH use  Monitor                           NSL IV                 Chronic alcohol abuse                         Withdrawal protocol                         Vitamin and mineral supplementation               Thrombocytopenia                         Likely chronic due to chronic etoh, liver disease    - DVT/PE prophylaxis:             lovenox on hold due to thrombocytopenia   - ID:         Completed ancef for open fracture treatment    - Metabolic Bone Disease:            + vitamin d deficiency                          Supplement              + testosterone deficiency                          Likely related to marijuana use and EtOH use    - Activity:             Nonweightbearing right leg   - FEN/GI prophylaxis/Foley/Lines:             diet as tolerated      - Impediments to fracture healing:             Numerous: Alcohol abuse, nicotine dependence, marijuana abuse   - Dispo:            awaiting response from SNFs             Hopeful to dc later today      Disposition: 01-Home or Self Care  Discharge Instructions    Call MD / Call 911   Complete by:  As directed    If you experience chest pain or shortness of breath, CALL  911 and be transported to the hospital emergency room.  If you develope a fever above 101 F, pus (white drainage) or increased drainage or redness at the wound, or calf pain, call your surgeon's office.   Constipation Prevention   Complete by:  As directed    Drink plenty of fluids.  Prune juice may be helpful.  You may use a stool softener, such as Colace (over the counter) 100 mg twice a day.  Use MiraLax (over the counter) for constipation as needed.   Diet general   Complete by:  As directed    Discharge instructions   Complete by:  As directed    Orthopaedic Trauma Service Discharge Instructions   General Discharge Instructions  WEIGHT BEARING STATUS: Nonweightbearing Right leg  RANGE OF MOTION/ACTIVITY: unrestricted range of motion toes, knee and hip on R leg  Wound Care: daily wound care starting immediately. Daily pin care to external fixator. See below   Discharge  Pin Site Instructions  Dress pins daily with Kerlix roll.Kevan Ny the Kerlix so that it tamps the skin down around the pin-skin interface to prevent/limit motion of the skin relative to the pin.  (Pin-skin motion is the primary cause of pain and infection related to external fixator pin sites).  Remove any crust or coagulum that may obstruct drainage with a saline moistened gauze or soap and water.  if there is no discernable drainage on the pin site dressing, the interval for change can by increased to every other day.  You may shower with the fixator, cleaning all pin sites gently with soap and water.  If you have a surgical wound this needs to be completely dry and without drainage before showering.  The extremity can be lifted by the fixator to facilitate wound care and transfers.  Notify the office/Doctor if you experience increasing drainage, redness, or pain from a pin site, or if you notice purulent (thick, snot-like) drainage.  Discharge Wound Care Instructions  Do NOT apply any ointments, solutions or  lotions to pin sites or surgical wounds.  These prevent needed drainage and even though solutions like hydrogen peroxide kill bacteria, they also damage cells lining the pin sites that help fight infection.  Applying lotions or ointments can keep the wounds moist and can cause them to breakdown and open up as well. This can increase the risk for infection. When in doubt call the office.  Surgical incisions should be dressed daily.  If any drainage is noted, use one layer of adaptic, then gauze, Kerlix, and an ace wrap.  Once the incision is completely dry and without drainage, it may be left open to air out.  Showering may begin 36-48 hours later.  Cleaning gently with soap and water.  Traumatic wounds should be dressed daily as well.    One layer of adaptic, gauze, Kerlix, then ace wrap.  The adaptic can be discontinued once the draining has ceased    If you have a wet to dry dressing: wet the gauze with saline the squeeze as much saline out so the gauze is moist (not soaking wet), place moistened gauze over wound, then place a dry gauze over the moist one, followed by Kerlix wrap, then ace wrap.  Diet: as you were eating previously.  Can use over the counter stool softeners and bowel preparations, such as Miralax, to help with bowel movements.  Narcotics can be constipating.  Be sure to drink plenty of fluids  PAIN MEDICATION USE AND EXPECTATIONS  You have likely been given narcotic medications to help control your pain.  After a traumatic event that results in an fracture (broken bone) with or without surgery, it is ok to use narcotic pain medications to help control one's pain.  We understand that everyone responds to pain differently and each individual patient will be evaluated on a regular basis for the continued need for narcotic medications. Ideally, narcotic medication use should last no more than 6-8 weeks (coinciding with fracture healing).   As a patient it is your responsibility as  well to monitor narcotic medication use and report the amount and frequency you use these medications when you come to your office visit.   We would also advise that if you are using narcotic medications, you should take a dose prior to therapy to maximize you participation.  IF YOU ARE ON NARCOTIC MEDICATIONS IT IS NOT PERMISSIBLE TO OPERATE A MOTOR VEHICLE (MOTORCYCLE/CAR/TRUCK/MOPED) OR HEAVY MACHINERY DO NOT MIX NARCOTICS WITH  OTHER CNS (CENTRAL NERVOUS SYSTEM) DEPRESSANTS SUCH AS ALCOHOL   STOP SMOKING OR USING NICOTINE PRODUCTS!!!!  As discussed nicotine severely impairs your body's ability to heal surgical and traumatic wounds but also impairs bone healing.  Wounds and bone heal by forming microscopic blood vessels (angiogenesis) and nicotine is a vasoconstrictor (essentially, shrinks blood vessels).  Therefore, if vasoconstriction occurs to these microscopic blood vessels they essentially disappear and are unable to deliver necessary nutrients to the healing tissue.  This is one modifiable factor that you can do to dramatically increase your chances of healing your injury.    (This means no smoking, no nicotine gum, patches, etc)  DO NOT USE NONSTEROIDAL ANTI-INFLAMMATORY DRUGS (NSAID'S)  Using products such as Advil (ibuprofen), Aleve (naproxen), Motrin (ibuprofen) for additional pain control during fracture healing can delay and/or prevent the healing response.  If you would like to take over the counter (OTC) medication, Tylenol (acetaminophen) is ok.  However, some narcotic medications that are given for pain control contain acetaminophen as well. Therefore, you should not exceed more than 4000 mg of tylenol in a day if you do not have liver disease.  Also note that there are may OTC medicines, such as cold medicines and allergy medicines that my contain tylenol as well.  If you have any questions about medications and/or interactions please ask your doctor/PA or your pharmacist.      ICE  AND ELEVATE INJURED/OPERATIVE EXTREMITY  Using ice and elevating the injured extremity above your heart can help with swelling and pain control.  Icing in a pulsatile fashion, such as 20 minutes on and 20 minutes off, can be followed.    Do not place ice directly on skin. Make sure there is a barrier between to skin and the ice pack.    Using frozen items such as frozen peas works well as the conform nicely to the are that needs to be iced.  USE AN ACE WRAP OR TED HOSE FOR SWELLING CONTROL  In addition to icing and elevation, Ace wraps or TED hose are used to help limit and resolve swelling.  It is recommended to use Ace wraps or TED hose until you are informed to stop.    When using Ace Wraps start the wrapping distally (farthest away from the body) and wrap proximally (closer to the body)   Example: If you had surgery on your leg or thing and you do not have a splint on, start the ace wrap at the toes and work your way up to the thigh        If you had surgery on your upper extremity and do not have a splint on, start the ace wrap at your fingers and work your way up to the upper arm  IF YOU ARE IN A SPLINT OR CAST DO NOT [place]   If your splint gets wet for any reason please contact the office immediately. You may shower in your splint or cast as long as you keep it dry.  This can be done by wrapping in a cast cover or garbage back (or similar)  Do Not stick any thing down your splint or cast such as pencils, money, or hangers to try and scratch yourself with.  If you feel itchy take benadryl as prescribed on the bottle for itching  IF YOU ARE IN A CAM BOOT (BLACK BOOT)  You may remove boot periodically. Perform daily dressing changes as noted below.  Wash the liner  of the boot regularly and wear a sock when wearing the boot. It is recommended that you sleep in the boot until told otherwise  CALL THE OFFICE WITH ANY QUESTIONS OR CONCERNS: [phone]   Driving restrictions    Complete by:  As directed    No driving   Increase activity slowly as tolerated   Complete by:  As directed    Non weight bearing   Complete by:  As directed    Laterality:  right   Extremity:  Lower     Allergies as of 10/18/2017      Reactions   Codeine Nausea And Vomiting, Other (See Comments)   CHILLS & SWEATS "DEATHLY ILL" per Pt description      Medication List    STOP taking these medications   ceFAZolin 2-4 GM/100ML-% IVPB Commonly known as:  ANCEF   gentamicin 570 mg in dextrose 5 % 100 mL   LORazepam 1 MG tablet Commonly known as:  ATIVAN   LORazepam 2 MG tablet Commonly known as:  ATIVAN   magnesium citrate Soln   magnesium hydroxide 400 MG/5ML suspension Commonly known as:  MILK OF MAGNESIA   metoCLOPramide 5 MG tablet Commonly known as:  REGLAN   morphine 2 MG/ML injection   ondansetron 4 MG tablet Commonly known as:  ZOFRAN   thiamine 100 MG tablet   zolpidem 5 MG tablet Commonly known as:  AMBIEN     TAKE these medications   acetaminophen 650 MG suppository Commonly known as:  TYLENOL Place 1 suppository (650 mg total) rectally every 6 (six) hours as needed for mild pain (or Fever >/= 101).   alprazolam 2 MG tablet Commonly known as:  XANAX Take 1 mg by mouth 2 (two) times daily.   ascorbic acid 500 MG tablet Commonly known as:  VITAMIN C Take 1 tablet (500 mg total) by mouth daily. Start taking on:  10/19/2017   bisacodyl 10 MG suppository Commonly known as:  DULCOLAX Place 1 suppository (10 mg total) rectally daily as needed for moderate constipation.   calcium citrate 950 MG tablet Commonly known as:  CALCITRATE - dosed in mg elemental calcium Take 1 tablet (200 mg of elemental calcium total) by mouth 2 (two) times daily.   Cholecalciferol 50000 units Tabs Take 5,000 Units by mouth daily.   docusate sodium 100 MG capsule Commonly known as:  COLACE Take 1 capsule (100 mg total) by mouth 2 (two) times daily.   folic acid 1  MG tablet Commonly known as:  FOLVITE Take 1 tablet (1 mg total) by mouth daily.   methocarbamol 500 MG tablet Commonly known as:  ROBAXIN Take 1-2 tablets (500-1,000 mg total) by mouth every 6 (six) hours as needed for muscle spasms. What changed:  how much to take   multivitamin with minerals Tabs tablet Take 1 tablet by mouth daily.   oxyCODONE 5 MG immediate release tablet Commonly known as:  Oxy IR/ROXICODONE Take 1-2 tablets (5-10 mg total) by mouth every 6 (six) hours as needed for severe pain. What changed:    how much to take  when to take this  reasons to take this  Another medication with the same name was removed. Continue taking this medication, and follow the directions you see here.   PHENObarbital 100 MG tablet Commonly known as:  LUMINAL Take 100 mg by mouth at bedtime.   polyethylene glycol packet Commonly known as:  MIRALAX / GLYCOLAX Take 17 g by mouth daily. Start taking  on:  10/19/2017            Discharge Care Instructions  (From admission, onward)        Start     Ordered   10/18/17 0000  Non weight bearing    Question Answer Comment  Laterality right   Extremity Lower      10/18/17 1122     Follow-up Information    Alt[place], MD. Schedule an appointment as soon as possible for a visit in 7 day(s).   Specialty:  Orthopedic Surgery Contact information: [address] [place] [address] [phone]           Discharge Instructions and Plan:  67 year old white male fall off of a porch with a open right tibial plafond fracture and right distal fibula fracture, transferred from [facility]   - Fall off porch    -Open right tibial plafond fracture and right fibula fracture s/p irrigation and debridement, IMN R fibula, ex fix revision R tibia             NWB x 8 weeks             Ex fix will be definitive means of treatment, will be in ex fix 6-8 weeks                 Pt at high risk for complications  including soft tissue breakdown, infection which could lead to loss of limb if pt resumes drinking, smoking and drug use                Dressings changes daily, including pin care             dressing changed today              Ok to get in shower and clean R leg with soap and water, then place new dressing                HHPT and RN               Ice and elevate for swelling control                Pin care as needed                           Pin Site Instructions   Dress pins daily with Kerlix roll starting on POD 2. Wrap the Kerlix so that it tamps the skin down around the pin-skin interface to prevent/limit motion of the skin relative to the pin.  (Pin-skin motion is the primary cause of pain and infection related to external fixator pin sites).   Remove any crust or coagulum that may obstruct drainage with a saline moistened gauze or soap and water.   After POD 3, if there is no discernable drainage on the pin site dressing, the interval for change can by increased to every other day.   may shower with the fixator, cleaning all pin sites gently with soap and water.  If you have a surgical wound this needs to be completely dry and without drainage before showering.   The extremity can be lifted by the fixator to facilitate wound care and transfers.       - Pain management:          Oxy IR           Robaxin    Will not use ultram as  this lowers seizure threshold    - ABL anemia/Hemodynamics             stable    - Medical issues              Epilepsy-Home medications               History of hepatitis C, transaminitis-chronic                         HIV test is negative                         + hep c                 Hyponatremia                         likely chronically low due to chronic EtOH use                  Chronic alcohol abuse                         Vitamin and mineral supplementation               Thrombocytopenia                         Likely chronic  due to chronic etoh, liver disease    - DVT/PE prophylaxis:             lovenox on hold due to thrombocytopenia  No pharmacologics at dc      - Metabolic Bone Disease:            + vitamin d deficiency                          Supplement              + testosterone deficiency  (secondary hypogonadism)                         Likely related to marijuana use and EtOH use    - Activity:             Nonweightbearing right leg   - FEN/GI prophylaxis/Foley/Lines:             diet as tolerated      - Impediments to fracture healing:             Numerous: Alcohol abuse, nicotine dependence, marijuana abuse   - Dispo:           Follow up with ortho in 1 week, sooner if increasing drainage from fixator sites    Signed:  Jari Pigg, PA-C [facility] [phone] (P) 10/18/2017, 11:24 AM

## 2017-10-18 NOTE — Progress Notes (Signed)
Orthopedic Trauma Service Progress Note   Patient ID: Glen Colon MRN: [id number] DOB/AGE: 1951/08/17 67 y.o.  Subjective:  No acute issues  Really perseverating on the fact that he may be required to give up his medicaid check if he goes to inpatient rehab   Thinks he can manage at home but unsure   No other issues noted    ROS As above  Objective:   VITALS:   Vitals:   10/17/17 0605 10/17/17 1315 10/17/17 2000 10/18/17 0558  BP: 112/61 121/69 138/66 (!) 125/50  Pulse: 78 75 86 (!) 55  Resp: 18 17 18 18   Temp: 98 F (36.7 C) 97.7 F (36.5 C) 98.6 F (37 C) 98.5 F (36.9 C)  TempSrc: Oral Oral Oral Oral  SpO2: 93% 98% 95% 97%    Estimated body mass index is 29.41 kg/m as calculated from the following:   Height as of 10/11/17: 5\' 10"  (1.778 m).   Weight as of 10/11/17: 93 kg (205 lb).   Intake/Output      01/21 0701 - 01/22 0700 01/22 0701 - 01/23 0700   P.O. 1080    Total Intake 1080    Urine 3825 300   Stool 1    Total Output 3826 300   Net -2746 -300        Stool Occurrence 1 x      LABS  No results found for this or any previous visit (from the past 24 hour(s)).   PHYSICAL EXAM:   Gen: resting comfortably in bed, NAD  Lungs: breathing unlabored Cardiac: Regular Ext:       Right Lower Extremity   Drainage decreasing   Dressing changed today   Traumatic medial ankle wound stable   Significant ecchymosis   No odor    No purulent drainage   Edges are approximated    Overall wound looks stable   Ext warm   Swelling controlled  + DP pulse   Distal motor and sensory functions intact   Assessment/Plan: 5 Days Post-Op   Active Problems:   Open pilon fracture of tibia   Marijuana abuse   Alcohol dependence (HCC)   Cirrhosis (HCC)   Hepatitis C, chronic (HCC)   Fracture   Drug-induced constipation   Hypoxia   Hyponatremia   Acute blood loss anemia   Thrombocytopenia (HCC)   Anti-infectives (From admission,  onward)   Start     Dose/Rate Route Frequency Ordered Stop   10/13/17 2030  ceFAZolin (ANCEF) IVPB 1 g/50 mL premix     1 g 100 mL/hr over 30 Minutes Intravenous Every 8 hours 10/13/17 1504 10/15/17 1229   10/13/17 0430  ceFAZolin (ANCEF) IVPB 1 g/50 mL premix  Status:  Discontinued     1 g 100 mL/hr over 30 Minutes Intravenous Every 8 hours 10/12/17 2107 10/13/17 1504    .  POD/HD#: 82  67 year old white male fall off of a porch with a open right tibial plafond fracture and right distal fibula fracture, transferred from [facility]   - Fall off porch    -Open right tibial plafond fracture and right fibula fracture s/p irrigation and debridement, IMN R fibula, ex fix revision R tibia             NWB x 8 weeks             Ex fix will be definitive means of treatment, will be in ex fix 6-8 weeks  Pt has reconsidered rehab facility             not a candidate for CIR per consult                Pt at high risk for complications including soft tissue breakdown, infection which could lead to loss of limb                 Dressings changes daily, including pin care             dressing changed today   Ok to get in shower and clean R leg with soap and water, then place new dressing                PT/OT             Ice and elevate              Pin care as needed                           Pin Site Instructions   Dress pins daily with Kerlix roll starting on POD 2. Wrap the Kerlix so that it tamps the skin down around the pin-skin interface to prevent/limit motion of the skin relative to the pin.  (Pin-skin motion is the primary cause of pain and infection related to external fixator pin sites).   Remove any crust or coagulum that may obstruct drainage with a saline moistened gauze or soap and water.   After POD 3, if there is no discernable drainage on the pin site dressing, the interval for change can by increased to every other day.   may shower with the fixator, cleaning all  pin sites gently with soap and water.  If you have a surgical wound this needs to be completely dry and without drainage before showering.   The extremity can be lifted by the fixator to facilitate wound care and transfers.       - Pain management:            continue with current regimen    - ABL anemia/Hemodynamics             stable    - Medical issues              Epilepsy-Home medications               History of hepatitis C, transaminitis-chronic                         HIV test is negative                         + hep c                 Hyponatremia                         likely chronically low due to chronic EtOH use                          Monitor                           NSL IV  Chronic alcohol abuse                         Withdrawal protocol                         Vitamin and mineral supplementation               Thrombocytopenia                         Likely chronic due to chronic etoh, liver disease    - DVT/PE prophylaxis:             lovenox on hold due to thrombocytopenia   - ID:         Completed ancef for open fracture treatment    - Metabolic Bone Disease:            + vitamin d deficiency                          Supplement              + testosterone deficiency                          Likely related to marijuana use and EtOH use    - Activity:             Nonweightbearing right leg   - FEN/GI prophylaxis/Foley/Lines:             diet as tolerated      - Impediments to fracture healing:             Numerous: Alcohol abuse, nicotine dependence, marijuana abuse   - Dispo:            awaiting response from SNFs  Hopeful to dc later today     Jari Pigg, PA-C [facility] [phone] (P) [phone] (O) [phone] (C) 10/18/2017, 10:10 AM

## 2017-10-18 NOTE — Progress Notes (Signed)
Physical Therapy Treatment Patient Details [name] MRN: [id number] DOB: June 01, 1951 Today's Date: 10/18/2017    History of Present Illness 67 yo male s/p R external fixator Im [place] pMH: 12 (+) beers per day, daily marijuana use, seizures, pNA,     PT Comments    Patient was able to ascend/descend single step with min A and RW as pt has no rails at home. Pt continues to demonstrate decreased activity tolerance and awareness of deficits and safety. Post acute rehab stay continues to best plan for Glen Colon however he continues to decline. Pt will need 24 hour supervision/assistance for safety.    Follow Up Recommendations  SNF     Equipment Recommendations  Other (comment)(pt has all needed DME at home per pt)    Recommendations for Other Services       Precautions / Restrictions Precautions Precautions: Fall Precaution Comments: external fixator R LE  Restrictions Weight Bearing Restrictions: Yes RLE Weight Bearing: Non weight bearing    Mobility  Bed Mobility Overal bed mobility: Independent                Transfers Overall transfer level: Needs assistance Equipment used: Rolling walker (2 wheeled) Transfers: Sit to/from Stand Sit to Stand: Min guard         General transfer comment: min guard for safety; cues for safe hand placement and use of AD  Ambulation/Gait Ambulation/Gait assistance: Min guard;Min assist Ambulation Distance (Feet): (61ft total with seated break) Assistive device: Rolling walker (2 wheeled)(wide) Gait Pattern/deviations: Step-to pattern Gait velocity: decreased   General Gait Details: cues for proximity of RW and safety   Stairs Stairs: Yes   Stair Management: No rails;Step to pattern;Backwards;With walker Number of Stairs: 1 General stair comments: cues for sequencing and technique; assist to stabilize RW and to steady in standing; pt believed he would bump up stairs and stand up once in the door as he reports this  is how he ascended stairs PTA however when practiced pt unable to stand up from step without assistance and heavy reliance on rail which he does not have at home; extensive discussion needed for pt to understand difficulty of ascending/descending stairs as he previously did  Wheelchair Mobility    Modified Rankin (Stroke Patients Only)       Balance Overall balance assessment: Needs assistance Sitting-balance support: No upper extremity supported Sitting balance-Leahy Scale: Good     Standing balance support: Bilateral upper extremity supported;During functional activity Standing balance-Leahy Scale: Poor                              Cognition Arousal/Alertness: Awake/alert Behavior During Therapy: WFL for tasks assessed/performed Overall Cognitive Status: Impaired/Different from baseline Area of Impairment: Safety/judgement                         Safety/Judgement: Decreased awareness of safety;Decreased awareness of deficits Awareness: Emergent          Exercises      General Comments        Pertinent Vitals/Pain Pain Assessment: Faces Faces Pain Scale: Hurts a little bit Pain Location: R LE in dependent position Pain Descriptors / Indicators: Sore Pain Intervention(s): Monitored during session;Repositioned    Home Living                      Prior Function  PT Goals (current goals can now be found in the care plan section) Acute Rehab PT Goals PT Goal Formulation: With patient Time For Goal Achievement: 10/28/17 Potential to Achieve Goals: Good Progress towards PT goals: Progressing toward goals    Frequency    Min 4X/week      PT Plan Current plan remains appropriate    Co-evaluation              AM-PAC PT "6 Clicks" Daily Activity  Outcome Measure  Difficulty turning over in bed (including adjusting bedclothes, sheets and blankets)?: A Little Difficulty moving from lying on back to sitting  on the side of the bed? : A Little Difficulty sitting down on and standing up from a chair with arms (e.g., wheelchair, bedside commode, etc,.)?: Unable Help needed moving to and from a bed to chair (including a wheelchair)?: A Little Help needed walking in hospital room?: A Little Help needed climbing 3-5 steps with a railing? : A Lot 6 Click Score: 15    End of Session Equipment Utilized During Treatment: Gait belt Activity Tolerance: Patient limited by fatigue Patient left: in chair;with call bell/phone within reach Nurse Communication: Mobility status PT Visit Diagnosis: Unsteadiness on feet (R26.81);Muscle weakness (generalized) (M62.81);History of falling (Z91.81);Difficulty in walking, not elsewhere classified (R26.2);Pain Pain - Right/Left: Right Pain - part of body: Leg     Time: 3428-7681 PT Time Calculation (min) (ACUTE ONLY): 28 min  Charges:  $Gait Training: 8-22 mins $Therapeutic Activity: 8-22 mins                    G Codes:       Earney Navy, PTA Pager: [phone]     Darliss Cheney 10/18/2017, 11:51 AM

## 2017-10-18 NOTE — Care Management Note (Signed)
Case Management Note  Patient Details  [name] MRN: [id number] Date of Birth: 05-24-1951  Subjective/Objective:   67 yr old male admitted with open pilon fracture of tibia, s/p application of external fixator.                  Action/Plan: Case manager spoke with patient concerning discharge plan and DME. Patient has decided that he will not go to SNF, he will go home and have assistance from his sister. Patient says that he has access to all the DME equipment he needs, and gas contacted his friend to bring him RW, 3in1 and they will deliver a hospital bed. Case manager called referral for [facility] to Neoma Laming, [facility] liaison.    Expected Discharge Date:  10/18/17               Expected Discharge Plan:  Home Health In-House Referral:  NA  Discharge planning Services  CM Consult  Post Acute Care Choice:  Home Health Choice offered to:  Patient  DME Arranged:  N/A(borrowing walker, wheelchair and 3in1, hospital bed) DME Agency:  NA  HH Arranged:  PT [place] Agency:  [facility]  Status of Service:  Completed, signed off  If discussed at [place] of Stay Meetings, dates discussed:    Additional Comments:  Ninfa Meeker, RN 10/18/2017, 11:58 AM

## 2017-10-18 NOTE — Progress Notes (Signed)
Removed, provided discharge instructions/instructions, all questions and concerns addressed, Pt not in distress, discharged home with belongings accompanied by sister.

## 2017-10-18 NOTE — Discharge Instructions (Signed)
[facility] Discharge Instructions   General Discharge Instructions  WEIGHT BEARING STATUS: Nonweightbearing Right leg  RANGE OF MOTION/ACTIVITY: unrestricted range of motion toes, knee and hip on R leg  Wound Care: daily wound care starting immediately. Daily pin care to external fixator. See below   Discharge Pin Site Instructions  Dress pins daily with Kerlix roll.Kevan Ny the Kerlix so that it tamps the skin down around the pin-skin interface to prevent/limit motion of the skin relative to the pin.  (Pin-skin motion is the primary cause of pain and infection related to external fixator pin sites).  Remove any crust or coagulum that may obstruct drainage with a saline moistened gauze or soap and water.  if there is no discernable drainage on the pin site dressing, the interval for change can by increased to every other day.  You may shower with the fixator, cleaning all pin sites gently with soap and water.  If you have a surgical wound this needs to be completely dry and without drainage before showering.  The extremity can be lifted by the fixator to facilitate wound care and transfers.  Notify the office/Doctor if you experience increasing drainage, redness, or pain from a pin site, or if you notice purulent (thick, snot-like) drainage.  Discharge Wound Care Instructions  Do NOT apply any ointments, solutions or lotions to pin sites or surgical wounds.  These prevent needed drainage and even though solutions like hydrogen peroxide kill bacteria, they also damage cells lining the pin sites that help fight infection.  Applying lotions or ointments can keep the wounds moist and can cause them to breakdown and open up as well. This can increase the risk for infection. When in doubt call the office.  Surgical incisions should be dressed daily.  If any drainage is noted, use one layer of adaptic, then gauze, Kerlix, and an ace wrap.  Once the incision is completely dry  and without drainage, it may be left open to air out.  Showering may begin 36-48 hours later.  Cleaning gently with soap and water.  Traumatic wounds should be dressed daily as well.    One layer of adaptic, gauze, Kerlix, then ace wrap.  The adaptic can be discontinued once the draining has ceased    If you have a wet to dry dressing: wet the gauze with saline the squeeze as much saline out so the gauze is moist (not soaking wet), place moistened gauze over wound, then place a dry gauze over the moist one, followed by Kerlix wrap, then ace wrap.  Diet: as you were eating previously.  Can use over the counter stool softeners and bowel preparations, such as Miralax, to help with bowel movements.  Narcotics can be constipating.  Be sure to drink plenty of fluids  PAIN MEDICATION USE AND EXPECTATIONS  You have likely been given narcotic medications to help control your pain.  After a traumatic event that results in an fracture (broken bone) with or without surgery, it is ok to use narcotic pain medications to help control one's pain.  We understand that everyone responds to pain differently and each individual patient will be evaluated on a regular basis for the continued need for narcotic medications. Ideally, narcotic medication use should last no more than 6-8 weeks (coinciding with fracture healing).   As a patient it is your responsibility as well to monitor narcotic medication use and report the amount and frequency you use these medications when you come to your office visit.  We would also advise that if you are using narcotic medications, you should take a dose prior to therapy to maximize you participation.  IF YOU ARE ON NARCOTIC MEDICATIONS IT IS NOT PERMISSIBLE TO OPERATE A MOTOR VEHICLE (MOTORCYCLE/CAR/TRUCK/MOPED) OR HEAVY MACHINERY DO NOT MIX NARCOTICS WITH OTHER CNS (CENTRAL NERVOUS SYSTEM) DEPRESSANTS SUCH AS ALCOHOL   STOP SMOKING OR USING NICOTINE PRODUCTS!!!!  As discussed  nicotine severely impairs your body's ability to heal surgical and traumatic wounds but also impairs bone healing.  Wounds and bone heal by forming microscopic blood vessels (angiogenesis) and nicotine is a vasoconstrictor (essentially, shrinks blood vessels).  Therefore, if vasoconstriction occurs to these microscopic blood vessels they essentially disappear and are unable to deliver necessary nutrients to the healing tissue.  This is one modifiable factor that you can do to dramatically increase your chances of healing your injury.    (This means no smoking, no nicotine gum, patches, etc)  DO NOT USE NONSTEROIDAL ANTI-INFLAMMATORY DRUGS (NSAID'S)  Using products such as Advil (ibuprofen), Aleve (naproxen), Motrin (ibuprofen) for additional pain control during fracture healing can delay and/or prevent the healing response.  If you would like to take over the counter (OTC) medication, Tylenol (acetaminophen) is ok.  However, some narcotic medications that are given for pain control contain acetaminophen as well. Therefore, you should not exceed more than 4000 mg of tylenol in a day if you do not have liver disease.  Also note that there are may OTC medicines, such as cold medicines and allergy medicines that my contain tylenol as well.  If you have any questions about medications and/or interactions please ask your doctor/PA or your pharmacist.      ICE AND ELEVATE INJURED/OPERATIVE EXTREMITY  Using ice and elevating the injured extremity above your heart can help with swelling and pain control.  Icing in a pulsatile fashion, such as 20 minutes on and 20 minutes off, can be followed.    Do not place ice directly on skin. Make sure there is a barrier between to skin and the ice pack.    Using frozen items such as frozen peas works well as the conform nicely to the are that needs to be iced.  USE AN ACE WRAP OR TED HOSE FOR SWELLING CONTROL  In addition to icing and elevation, Ace wraps or TED hose are  used to help limit and resolve swelling.  It is recommended to use Ace wraps or TED hose until you are informed to stop.    When using Ace Wraps start the wrapping distally (farthest away from the body) and wrap proximally (closer to the body)   Example: If you had surgery on your leg or thing and you do not have a splint on, start the ace wrap at the toes and work your way up to the thigh        If you had surgery on your upper extremity and do not have a splint on, start the ace wrap at your fingers and work your way up to the upper arm  IF YOU ARE IN A SPLINT OR CAST DO NOT [place]   If your splint gets wet for any reason please contact the office immediately. You may shower in your splint or cast as long as you keep it dry.  This can be done by wrapping in a cast cover or garbage back (or similar)  Do Not stick any thing down your splint or cast such as pencils, money,  or hangers to try and scratch yourself with.  If you feel itchy take benadryl as prescribed on the bottle for itching  IF YOU ARE IN A CAM BOOT (BLACK BOOT)  You may remove boot periodically. Perform daily dressing changes as noted below.  Wash the liner of the boot regularly and wear a sock when wearing the boot. It is recommended that you sleep in the boot until told otherwise  CALL THE OFFICE WITH ANY QUESTIONS OR CONCERNS: [phone]

## 2017-10-20 LAB — HCV RT-PCR, QUANT (NON-GRAPH)
HCV log10: 1.845 log10 IU/mL
HEPATITIS C QUANTITATION: 70 [IU]/mL

## 2017-10-20 LAB — HCV AB W REFLEX TO QUANT PCR

## 2017-10-26 ENCOUNTER — Inpatient Hospital Stay (HOSPITAL_COMMUNITY)
Admission: AD | Admit: 2017-10-26 | Discharge: 2017-11-01 | DRG: 603 | Disposition: A | Discharge status: Home / Self Care | Payer: Medicaid Other | Source: Ambulatory Visit | Attending: Orthopedic Surgery | Admitting: Orthopedic Surgery

## 2017-10-26 ENCOUNTER — Other Ambulatory Visit: Payer: Self-pay

## 2017-10-26 ENCOUNTER — Encounter (HOSPITAL_COMMUNITY): Payer: Self-pay

## 2017-10-26 DIAGNOSIS — E559 Vitamin D deficiency, unspecified: Secondary | ICD-10-CM | POA: Diagnosis present

## 2017-10-26 DIAGNOSIS — S82871B Displaced pilon fracture of right tibia, initial encounter for open fracture type I or II: Secondary | ICD-10-CM | POA: Diagnosis present

## 2017-10-26 DIAGNOSIS — D62 Acute posthemorrhagic anemia: Secondary | ICD-10-CM | POA: Diagnosis present

## 2017-10-26 DIAGNOSIS — L03115 Cellulitis of right lower limb: Secondary | ICD-10-CM | POA: Diagnosis present

## 2017-10-26 DIAGNOSIS — F1020 Alcohol dependence, uncomplicated: Secondary | ICD-10-CM | POA: Diagnosis present

## 2017-10-26 DIAGNOSIS — Z885 Allergy status to narcotic agent status: Secondary | ICD-10-CM

## 2017-10-26 DIAGNOSIS — Z902 Acquired absence of lung [part of]: Secondary | ICD-10-CM | POA: Diagnosis not present

## 2017-10-26 DIAGNOSIS — E291 Testicular hypofunction: Secondary | ICD-10-CM | POA: Diagnosis present

## 2017-10-26 DIAGNOSIS — F17210 Nicotine dependence, cigarettes, uncomplicated: Secondary | ICD-10-CM | POA: Diagnosis present

## 2017-10-26 DIAGNOSIS — B182 Chronic viral hepatitis C: Secondary | ICD-10-CM | POA: Diagnosis present

## 2017-10-26 DIAGNOSIS — J9811 Atelectasis: Secondary | ICD-10-CM | POA: Diagnosis not present

## 2017-10-26 DIAGNOSIS — Z720 Tobacco use: Secondary | ICD-10-CM | POA: Diagnosis present

## 2017-10-26 DIAGNOSIS — S82873B Displaced pilon fracture of unspecified tibia, initial encounter for open fracture type I or II: Secondary | ICD-10-CM | POA: Diagnosis present

## 2017-10-26 DIAGNOSIS — G40909 Epilepsy, unspecified, not intractable, without status epilepticus: Secondary | ICD-10-CM | POA: Diagnosis present

## 2017-10-26 DIAGNOSIS — K7460 Unspecified cirrhosis of liver: Secondary | ICD-10-CM | POA: Diagnosis present

## 2017-10-26 DIAGNOSIS — F1210 Cannabis abuse, uncomplicated: Secondary | ICD-10-CM | POA: Diagnosis present

## 2017-10-26 DIAGNOSIS — R0902 Hypoxemia: Secondary | ICD-10-CM

## 2017-10-26 HISTORY — DX: Personal history of other diseases of the digestive system: Z87.19

## 2017-10-26 HISTORY — DX: Unspecified convulsions: R56.9

## 2017-10-26 HISTORY — DX: Other forms of angina pectoris: I20.89

## 2017-10-26 HISTORY — DX: Anxiety disorder, unspecified: F41.9

## 2017-10-26 HISTORY — DX: Anemia, unspecified: D64.9

## 2017-10-26 HISTORY — DX: Unspecified osteoarthritis, unspecified site: M19.90

## 2017-10-26 HISTORY — DX: Other forms of angina pectoris: I20.8

## 2017-10-26 HISTORY — DX: Unspecified chronic bronchitis: J42

## 2017-10-26 HISTORY — DX: Chronic obstructive pulmonary disease, unspecified: J44.9

## 2017-10-26 LAB — CBC WITH DIFFERENTIAL/PLATELET
BASOS PCT: 0 %
Basophils Absolute: 0 10*3/uL (ref 0.0–0.1)
EOS PCT: 2 %
Eosinophils Absolute: 0.1 10*3/uL (ref 0.0–0.7)
HCT: 34.6 % — ABNORMAL LOW (ref 39.0–52.0)
Hemoglobin: 11.8 g/dL — ABNORMAL LOW (ref 13.0–17.0)
LYMPHS ABS: 1.3 10*3/uL (ref 0.7–4.0)
Lymphocytes Relative: 28 %
MCH: 35.6 pg — ABNORMAL HIGH (ref 26.0–34.0)
MCHC: 34.1 g/dL (ref 30.0–36.0)
MCV: 104.5 fL — AB (ref 78.0–100.0)
MONOS PCT: 11 %
Monocytes Absolute: 0.5 10*3/uL (ref 0.1–1.0)
Neutro Abs: 2.6 10*3/uL (ref 1.7–7.7)
Neutrophils Relative %: 59 %
Platelets: 78 10*3/uL — ABNORMAL LOW (ref 150–400)
RBC: 3.31 MIL/uL — AB (ref 4.22–5.81)
RDW: 15.7 % — AB (ref 11.5–15.5)
SMEAR REVIEW: DECREASED
WBC: 4.5 10*3/uL (ref 4.0–10.5)

## 2017-10-26 LAB — URINALYSIS, ROUTINE W REFLEX MICROSCOPIC
BILIRUBIN URINE: NEGATIVE
GLUCOSE, UA: NEGATIVE mg/dL
Hgb urine dipstick: NEGATIVE
KETONES UR: NEGATIVE mg/dL
LEUKOCYTES UA: NEGATIVE
NITRITE: NEGATIVE
PROTEIN: NEGATIVE mg/dL
Specific Gravity, Urine: 1.013 (ref 1.005–1.030)
pH: 6 (ref 5.0–8.0)

## 2017-10-26 LAB — COMPREHENSIVE METABOLIC PANEL
ALT: 33 U/L (ref 17–63)
ANION GAP: 6 (ref 5–15)
AST: 63 U/L — ABNORMAL HIGH (ref 15–41)
Albumin: 2.2 g/dL — ABNORMAL LOW (ref 3.5–5.0)
Alkaline Phosphatase: 198 U/L — ABNORMAL HIGH (ref 38–126)
BILIRUBIN TOTAL: 1.4 mg/dL — AB (ref 0.3–1.2)
BUN: 9 mg/dL (ref 6–20)
CO2: 27 mmol/L (ref 22–32)
Calcium: 8.4 mg/dL — ABNORMAL LOW (ref 8.9–10.3)
Chloride: 102 mmol/L (ref 101–111)
Creatinine, Ser: 0.74 mg/dL (ref 0.61–1.24)
GFR calc Af Amer: >60 mL/min (ref >60)
Glucose, Bld: 87 mg/dL (ref 65–99)
POTASSIUM: 3.8 mmol/L (ref 3.5–5.1)
Sodium: 135 mmol/L (ref 135–145)
Total Protein: 5.6 g/dL — ABNORMAL LOW (ref 6.5–8.1)

## 2017-10-26 LAB — RAPID URINE DRUG SCREEN, HOSP PERFORMED
Amphetamines: NOT DETECTED
BENZODIAZEPINES: POSITIVE — AB
Barbiturates: POSITIVE — AB
COCAINE: NOT DETECTED
Opiates: NOT DETECTED
Tetrahydrocannabinol: NOT DETECTED

## 2017-10-26 LAB — ETHANOL

## 2017-10-26 LAB — C-REACTIVE PROTEIN: CRP: <0.8 mg/dL (ref <1.0)

## 2017-10-26 LAB — SEDIMENTATION RATE: SED RATE: 23 mm/h — AB (ref 0–16)

## 2017-10-26 MED ORDER — LORAZEPAM 2 MG/ML IJ SOLN: 1.0000 mg | Freq: Four times a day (QID) | INTRAMUSCULAR | Status: AC | PRN

## 2017-10-26 MED ORDER — ACETAMINOPHEN 650 MG RE SUPP: 650.0000 mg | Freq: Four times a day (QID) | RECTAL | Status: DC | PRN

## 2017-10-26 MED ORDER — METHOCARBAMOL 500 MG PO TABS
500.0000 mg | ORAL_TABLET | Freq: Four times a day (QID) | ORAL | Status: DC | PRN
  Administered 2017-10-27 – 2017-10-31 (×10): 500 mg via ORAL
  Filled 2017-10-26 (×10): qty 1

## 2017-10-26 MED ORDER — ACETAMINOPHEN 325 MG PO TABS: 650.0000 mg | ORAL_TABLET | Freq: Four times a day (QID) | ORAL | Status: DC | PRN

## 2017-10-26 MED ORDER — POTASSIUM CHLORIDE IN NACL 20-0.9 MEQ/L-% IV SOLN
INTRAVENOUS | Status: DC
  Administered 2017-10-26 – 2017-10-29 (×2): via INTRAVENOUS
  Filled 2017-10-26 (×3): qty 1000

## 2017-10-26 MED ORDER — OXYCODONE-ACETAMINOPHEN 5-325 MG PO TABS
1.0000 | ORAL_TABLET | Freq: Four times a day (QID) | ORAL | Status: DC | PRN
  Administered 2017-10-27: 2 via ORAL
  Filled 2017-10-26 (×2): qty 2

## 2017-10-26 MED ORDER — POLYETHYLENE GLYCOL 3350 17 G PO PACK
17.0000 g | PACK | Freq: Every day | ORAL | Status: DC
  Administered 2017-10-28: 17 g via ORAL
  Filled 2017-10-26 (×6): qty 1

## 2017-10-26 MED ORDER — THIAMINE HCL 100 MG/ML IJ SOLN
100.0000 mg | Freq: Every day | INTRAMUSCULAR | Status: DC
  Filled 2017-10-26 (×3): qty 2

## 2017-10-26 MED ORDER — METHOCARBAMOL 1000 MG/10ML IJ SOLN
500.0000 mg | Freq: Four times a day (QID) | INTRAVENOUS | Status: DC | PRN
  Filled 2017-10-26: qty 5

## 2017-10-26 MED ORDER — VITAMIN B-1 100 MG PO TABS
100.0000 mg | ORAL_TABLET | Freq: Every day | ORAL | Status: DC
  Administered 2017-10-26 – 2017-11-01 (×7): 100 mg via ORAL
  Filled 2017-10-26 (×8): qty 1

## 2017-10-26 MED ORDER — OXYCODONE HCL 5 MG PO TABS
5.0000 mg | ORAL_TABLET | ORAL | Status: DC | PRN
  Administered 2017-10-26 – 2017-10-31 (×16): 10 mg via ORAL
  Filled 2017-10-26 (×16): qty 2

## 2017-10-26 MED ORDER — ADULT MULTIVITAMIN W/MINERALS CH
1.0000 | ORAL_TABLET | Freq: Every day | ORAL | Status: DC
  Administered 2017-10-26 – 2017-11-01 (×7): 1 via ORAL
  Filled 2017-10-26 (×7): qty 1

## 2017-10-26 MED ORDER — PIPERACILLIN-TAZOBACTAM 3.375 G IVPB
3.3750 g | Freq: Three times a day (TID) | INTRAVENOUS | Status: DC
  Administered 2017-10-26 – 2017-10-31 (×14): 3.375 g via INTRAVENOUS
  Filled 2017-10-26 (×15): qty 50

## 2017-10-26 MED ORDER — PIPERACILLIN-TAZOBACTAM 3.375 G IVPB 30 MIN
3.3750 g | Freq: Once | INTRAVENOUS | Status: AC
  Administered 2017-10-26: 3.375 g via INTRAVENOUS
  Filled 2017-10-26: qty 50

## 2017-10-26 MED ORDER — ENOXAPARIN SODIUM 40 MG/0.4ML ~~LOC~~ SOLN
40.0000 mg | Freq: Every day | SUBCUTANEOUS | Status: DC
  Administered 2017-10-26 – 2017-11-01 (×7): 40 mg via SUBCUTANEOUS
  Filled 2017-10-26 (×7): qty 0.4

## 2017-10-26 MED ORDER — LORAZEPAM 1 MG PO TABS
1.0000 mg | ORAL_TABLET | Freq: Four times a day (QID) | ORAL | Status: AC | PRN
  Administered 2017-10-27 – 2017-10-28 (×2): 1 mg via ORAL
  Filled 2017-10-26 (×2): qty 1

## 2017-10-26 MED ORDER — DOCUSATE SODIUM 100 MG PO CAPS
100.0000 mg | ORAL_CAPSULE | Freq: Two times a day (BID) | ORAL | Status: DC
  Administered 2017-10-26 – 2017-11-01 (×11): 100 mg via ORAL
  Filled 2017-10-26 (×11): qty 1

## 2017-10-26 MED ORDER — FOLIC ACID 1 MG PO TABS
1.0000 mg | ORAL_TABLET | Freq: Every day | ORAL | Status: DC
  Administered 2017-10-26 – 2017-11-01 (×7): 1 mg via ORAL
  Filled 2017-10-26 (×7): qty 1

## 2017-10-26 NOTE — H&P (Signed)
Orthopaedic Trauma Service (OTS) Consult   Patient ID: Glen Colon MRN: [id number] DOB/AGE: 03/28/1951 67 y.o.   HPI: Glen Colon is an 67 y.o. white male well-known to the orthopedic trauma service for an open right pilon fracture approximately 2 weeks ago.  Patient was treated with external fixation and intramedullary nailing of his fibula due to severe soft tissue swelling and large traumatic wound medially.  Patient discharged from hospital on 10/18/2017.  Presented there was erythema around his wound.  No purulence is noted to the office today for evaluation of his traumatic wound.  But the wound does look tenuous.  He is admitted for cellulitis treatment with IV antibiotics given the very little margin due to his medical comorbidities.  Again patient has hepatitis C, alcohol abuse marijuana abuse and nicotine dependence.  Past Medical History:  Diagnosis Date  . Alcohol dependence ([place]) 10/13/2017  . Cirrhosis ([place]) 10/13/2017  . Epilepsy ([place])   . Hepatitis C, chronic ([place]) 10/13/2017  . Hypogonadism in male 10/18/2017  . Marijuana abuse 10/13/2017  . Pneumonia   . Tobacco abuse   . Vitamin D deficiency 10/18/2017    Past Surgical History:  Procedure Laterality Date  . EXTERNAL FIXATION LEG Right 10/13/2017   Procedure: EXTERNAL FIXATION LEG;  Surgeon: Altamese [place], MD;  Location: [facility];  Service: Orthopedics;  Laterality: Right;  . I&D EXTREMITY Right 10/13/2017   Procedure: REPEAT IRRIGATION AND DEBRIDEMENT EXTREMITY WITH ADJUSTMENT OF EXTERNAL FIXITOR;  Surgeon: Altamese [place], MD;  Location: [place];  Service: Orthopedics;  Laterality: Right;  . INTRAMEDULLARY (IM) NAIL FIBULA Right 10/13/2017   Procedure: INTRAMEDULLARY (IM) NAIL FIBULA;  Surgeon: Altamese [place], MD;  Location: [place];  Service: Orthopedics;  Laterality: Right;  . LIVER CYST REMOVAL    . [place], "walking pna"  . ORIF ANKLE FRACTURE Right 10/11/2017   Procedure: I & D right ankle with  application of external fixator and wound vac;  Surgeon: Hessie Knows, MD;  Location: [facility];  Service: Orthopedics;  Laterality: Right;    History reviewed. No pertinent family history.  Social History:  reports that he has been smoking cigarettes.  He has a 69.00 pack-year smoking history. he has never used smokeless tobacco. He reports that he drinks alcohol. He reports that he uses drugs. Drug: Marijuana.  Allergies:  Allergies  Allergen Reactions  . Codeine Nausea And Vomiting and Other (See Comments)    CHILLS & SWEATS "DEATHLY ILL" per Pt description    Medications: I have reviewed the patient's current medications.  Current Meds  Medication Sig  . acetaminophen (TYLENOL) 650 MG suppository Place 1 suppository (650 mg total) rectally every 6 (six) hours as needed for mild pain (or Fever >/= 101).  Marland Kitchen alprazolam (XANAX) 2 MG tablet Take 1 mg by mouth 2 (two) times daily.  . bisacodyl (DULCOLAX) 10 MG suppository Place 1 suppository (10 mg total) rectally daily as needed for moderate constipation.  . calcium citrate (CALCITRATE - DOSED IN MG ELEMENTAL CALCIUM) 950 MG tablet Take 1 tablet (200 mg of elemental calcium total) by mouth 2 (two) times daily.  . cholecalciferol 50000 units TABS Take 5,000 Units by mouth daily.  Marland Kitchen docusate sodium (COLACE) 100 MG capsule Take 1 capsule (100 mg total) by mouth 2 (two) times daily.  . folic acid (FOLVITE) 1 MG tablet Take 1 tablet (1 mg total) by mouth daily.  . methocarbamol (ROBAXIN) 500 MG tablet Take 1-2 tablets (  500-1,000 mg total) by mouth every 6 (six) hours as needed for muscle spasms.  . Multiple Vitamin (MULTIVITAMIN WITH MINERALS) TABS tablet Take 1 tablet by mouth daily.  Marland Kitchen oxyCODONE (OXY IR/ROXICODONE) 5 MG immediate release tablet Take 1-2 tablets (5-10 mg total) by mouth every 6 (six) hours as needed for severe pain.  Marland Kitchen PHENObarbital (LUMINAL) 100 MG tablet Take 100 mg by mouth at bedtime.   . polyethylene glycol (MIRALAX /  GLYCOLAX) packet Take 17 g by mouth daily. (Patient taking differently: Take 8.5 g by mouth daily as needed for severe constipation. )  . vitamin C (VITAMIN C) 500 MG tablet Take 1 tablet (500 mg total) by mouth daily.    Results for orders placed or performed during the hospital encounter of 10/26/17 (from the past 48 hour(s))  CBC with Differential/Platelet     Status: Abnormal   Collection Time: 10/26/17  1:59 PM  Result Value Ref Range   WBC 4.5 4.0 - 10.5 K/uL    Comment: WHITE COUNT CONFIRMED ON SMEAR   RBC 3.31 (L) 4.22 - 5.81 MIL/uL   Hemoglobin 11.8 (L) 13.0 - 17.0 g/dL   HCT 34.6 (L) 39.0 - 52.0 %   MCV 104.5 (H) 78.0 - 100.0 fL   MCH 35.6 (H) 26.0 - 34.0 pg   MCHC 34.1 30.0 - 36.0 g/dL   RDW 15.7 (H) 11.5 - 15.5 %   Platelets 78 (L) 150 - 400 K/uL    Comment: REPEATED TO VERIFY SPECIMEN CHECKED FOR CLOTS PLATELET COUNT CONFIRMED BY SMEAR    Neutrophils Relative % 59 %   Lymphocytes Relative 28 %   Monocytes Relative 11 %   Eosinophils Relative 2 %   Basophils Relative 0 %   Neutro Abs 2.6 1.7 - 7.7 K/uL   Lymphs Abs 1.3 0.7 - 4.0 K/uL   Monocytes Absolute 0.5 0.1 - 1.0 K/uL   Eosinophils Absolute 0.1 0.0 - 0.7 K/uL   Basophils Absolute 0.0 0.0 - 0.1 K/uL   Smear Review PLATELETS APPEAR DECREASED   Comprehensive metabolic panel     Status: Abnormal   Collection Time: 10/26/17  1:59 PM  Result Value Ref Range   Sodium 135 135 - 145 mmol/L   Potassium 3.8 3.5 - 5.1 mmol/L   Chloride 102 101 - 111 mmol/L   CO2 27 22 - 32 mmol/L   Glucose, Bld 87 65 - 99 mg/dL   BUN 9 6 - 20 mg/dL   Creatinine, Ser 0.74 0.61 - 1.24 mg/dL   Calcium 8.4 (L) 8.9 - 10.3 mg/dL   Total Protein 5.6 (L) 6.5 - 8.1 g/dL   Albumin 2.2 (L) 3.5 - 5.0 g/dL   AST 63 (H) 15 - 41 U/L   ALT 33 17 - 63 U/L   Alkaline Phosphatase 198 (H) 38 - 126 U/L   Total Bilirubin 1.4 (H) 0.3 - 1.2 mg/dL   GFR calc non Af Amer >60 >60 mL/min   GFR calc Af Amer >60 >60 mL/min    Comment: (NOTE) The eGFR has  been calculated using the CKD EPI equation. This calculation has not been validated in all clinical situations. eGFR's persistently <60 mL/min signify possible Chronic Kidney Disease.    Anion gap 6 5 - 15  Ethanol     Status: None   Collection Time: 10/26/17  1:59 PM  Result Value Ref Range   Alcohol, Ethyl (B) <10 <10 mg/dL    Comment:  LOWEST DETECTABLE LIMIT FOR SERUM ALCOHOL IS 10 mg/dL FOR MEDICAL PURPOSES ONLY   Urinalysis, Routine w reflex microscopic     Status: None   Collection Time: 10/26/17  2:54 PM  Result Value Ref Range   Color, Urine YELLOW YELLOW   APPearance CLEAR CLEAR   Specific Gravity, Urine 1.013 1.005 - 1.030   pH 6.0 5.0 - 8.0   Glucose, UA NEGATIVE NEGATIVE mg/dL   Hgb urine dipstick NEGATIVE NEGATIVE   Bilirubin Urine NEGATIVE NEGATIVE   Ketones, ur NEGATIVE NEGATIVE mg/dL   Protein, ur NEGATIVE NEGATIVE mg/dL   Nitrite NEGATIVE NEGATIVE   Leukocytes, UA NEGATIVE NEGATIVE  Urine rapid drug screen (hosp performed)     Status: Abnormal   Collection Time: 10/26/17  2:55 PM  Result Value Ref Range   Opiates NONE DETECTED NONE DETECTED   Cocaine NONE DETECTED NONE DETECTED   Benzodiazepines POSITIVE (A) NONE DETECTED   Amphetamines NONE DETECTED NONE DETECTED   Tetrahydrocannabinol NONE DETECTED NONE DETECTED   Barbiturates POSITIVE (A) NONE DETECTED    Comment: (NOTE) DRUG SCREEN FOR MEDICAL PURPOSES ONLY.  IF CONFIRMATION IS NEEDED FOR ANY PURPOSE, NOTIFY LAB WITHIN 5 DAYS. LOWEST DETECTABLE LIMITS FOR URINE DRUG SCREEN Drug Class                     Cutoff (ng/mL) Amphetamine and metabolites    1000 Barbiturate and metabolites    200 Benzodiazepine                 401 Tricyclics and metabolites     300 Opiates and metabolites        300 Cocaine and metabolites        300 THC                            50     No results found.  Review of Systems  Constitutional: Negative for chills and fever.  Respiratory: Negative for  shortness of breath and wheezing.   Cardiovascular: Negative for chest pain and palpitations.  Gastrointestinal: Negative for abdominal pain, nausea and vomiting.     Vitals are pending  Physical Exam  Constitutional: He is cooperative.  Older appearing male, NAD   Cardiovascular: Normal rate, regular rhythm, S1 normal and S2 normal.  Pulmonary/Chest: No accessory muscle usage. No respiratory distress.  Musculoskeletal:  Right Lower extremity  Ex fix stable Mild erythema around tibial pins.  There is also erythema around the first metatarsal pin.  Other pins are stable  medial traumatic wound is approximated but appears tenuous.  There is no purulence.  Some mild skin sloughing is noted.  Bloody drainage also noted but it is not draining at a high rate Moderate swelling to the right lower extremity DPN, SPN, TN sensory functions are grossly intact EHL, FHL, and lesser toe motor functions are grossly intact + DP pulse No deep calf tenderness   Neurological: He is alert.     Assessment/Plan:  67 year old male approximately 2 weeks post open right tibial plafond fracture treated with external fixation and intramedullary nailing of his right fibula admitted for cellulitis right ankle  -Cellulitis right ankle  Admit for IV antibiotics as we have very little margin for error.  Should deep infection set in the risk is quite great that the patient will require amputation  Nonweightbearing right leg  Will monitor how he responds to IV antibiotics  Area of erythema has  been outlined  Hopeful to transition to oral antibiotics once a response is seen  - Pain management:  Percocet and OxyIR  - ABL anemia/Hemodynamics  Stable - Medical issues   Nicotine dependence, alcohol abuse, hepatitis C  - DVT/PE prophylaxis:  Lovenox - ID:   Zosyn per pharmacy protocol   - FEN/GI prophylaxis/Foley/Lines:  Regular diet  IV fluids  - Impediments to fracture healing:  Open fracture,  nicotine use, alcohol use, drug use  - Dispo:  IV antibiotics   Jari Pigg, PA-C [facility] [phone] (C) [phone] (O) 10/26/2017, 5:51 PM

## 2017-10-26 NOTE — Progress Notes (Addendum)
Pharmacy Antibiotic Note  Glen Colon is a 67 y.o. male admitted on 10/26/2017 with cellulitis.  Pharmacy has been consulted for Zosyn dosing. SCr wnl on 1/22, will f/u repeat today to confirm dosing.  Plan: Zosyn 3.375g IV (2min inf) x1; then 3.375g IV q8h (4h inf) Monitor clinical progress, c/s, renal function F/u de-escalation plan/LOT F/u BMET today to confirm dosing  ADDENDUM:  SCr 0.74 - current dose ok.    No data recorded.  No results for input(s): WBC, CREATININE, LATICACIDVEN, VANCOTROUGH, VANCOPEAK, VANCORANDOM, GENTTROUGH, GENTPEAK, GENTRANDOM, TOBRATROUGH, TOBRAPEAK, TOBRARND, AMIKACINPEAK, AMIKACINTROU, AMIKACIN in the last 168 hours.  CrCl cannot be calculated (Unknown ideal weight.).    Allergies  Allergen Reactions  . Codeine Nausea And Vomiting and Other (See Comments)    CHILLS & SWEATS "DEATHLY ILL" per Pt description    Elicia Lamp, PharmD, BCPS Clinical Pharmacist 10/26/2017 12:55 PM

## 2017-10-27 MED ORDER — CALCIUM CITRATE 950 (200 CA) MG PO TABS
200.0000 mg | ORAL_TABLET | Freq: Two times a day (BID) | ORAL | Status: DC
  Administered 2017-10-27 – 2017-10-31 (×10): 200 mg via ORAL
  Filled 2017-10-27 (×12): qty 1

## 2017-10-27 MED ORDER — VITAMIN D 1000 UNITS PO TABS
5000.0000 [IU] | ORAL_TABLET | Freq: Every day | ORAL | Status: DC
  Administered 2017-10-27 – 2017-11-01 (×6): 5000 [IU] via ORAL
  Filled 2017-10-27 (×6): qty 5

## 2017-10-27 MED ORDER — VITAMIN C 500 MG PO TABS
500.0000 mg | ORAL_TABLET | Freq: Every day | ORAL | Status: DC
  Administered 2017-10-27 – 2017-11-01 (×6): 500 mg via ORAL
  Filled 2017-10-27 (×6): qty 1

## 2017-10-27 MED ORDER — PHENOBARBITAL 97.2 MG PO TABS
97.2000 mg | ORAL_TABLET | Freq: Every day | ORAL | Status: DC
  Administered 2017-10-27 – 2017-10-31 (×5): 97.2 mg via ORAL
  Filled 2017-10-27 (×5): qty 1

## 2017-10-27 NOTE — Progress Notes (Signed)
Orthopedic Trauma Service Progress Note   Patient ID: Glen Colon MRN: [id number] DOB/AGE: [birth year] 67 y.o.  Subjective:  Doing ok  No specific complaints Tolerating zosyn   Up in chair  Has worked with therapy   Review of Systems  Constitutional: Negative for chills and fever.  Respiratory: Negative for shortness of breath and wheezing.   Cardiovascular: Negative for chest pain and palpitations.  Gastrointestinal: Negative for nausea and vomiting.  Neurological: Negative for tingling.     Objective:   VITALS:   Vitals:   10/26/17 2050 10/27/17 0536  BP: (!) 114/58 (!) 103/55  Pulse: 79 71  Resp: 17 16  Temp: 98.5 F (36.9 C) 97.7 F (36.5 C)  TempSrc: Oral Axillary  SpO2: 96% 93%    Estimated body mass index is 29.41 kg/m as calculated from the following:   Height as of 10/11/17: 5\' 10"  (1.778 m).   Weight as of 10/11/17: 93 kg (205 lb).   Intake/Output      01/30 0701 - 01/31 0700 01/31 0701 - 02/01 0700   P.O.  480   Total Intake  480   Urine 1000 1000   Total Output 1000 1000   Net -1000 -520          LABS  Results for orders placed or performed during the hospital encounter of 10/26/17 (from the past 24 hour(s))  CBC with Differential/Platelet     Status: Abnormal   Collection Time: 10/26/17  1:59 PM  Result Value Ref Range   WBC 4.5 4.0 - 10.5 K/uL   RBC 3.31 (L) 4.22 - 5.81 MIL/uL   Hemoglobin 11.8 (L) 13.0 - 17.0 g/dL   HCT 34.6 (L) 39.0 - 52.0 %   MCV 104.5 (H) 78.0 - 100.0 fL   MCH 35.6 (H) 26.0 - 34.0 pg   MCHC 34.1 30.0 - 36.0 g/dL   RDW 15.7 (H) 11.5 - 15.5 %   Platelets 78 (L) 150 - 400 K/uL   Neutrophils Relative % 59 %   Lymphocytes Relative 28 %   Monocytes Relative 11 %   Eosinophils Relative 2 %   Basophils Relative 0 %   Neutro Abs 2.6 1.7 - 7.7 K/uL   Lymphs Abs 1.3 0.7 - 4.0 K/uL   Monocytes Absolute 0.5 0.1 - 1.0 K/uL   Eosinophils Absolute 0.1 0.0 - 0.7 K/uL   Basophils Absolute 0.0 0.0 -  0.1 K/uL   Smear Review PLATELETS APPEAR DECREASED   Comprehensive metabolic panel     Status: Abnormal   Collection Time: 10/26/17  1:59 PM  Result Value Ref Range   Sodium 135 135 - 145 mmol/L   Potassium 3.8 3.5 - 5.1 mmol/L   Chloride 102 101 - 111 mmol/L   CO2 27 22 - 32 mmol/L   Glucose, Bld 87 65 - 99 mg/dL   BUN 9 6 - 20 mg/dL   Creatinine, Ser 0.74 0.61 - 1.24 mg/dL   Calcium 8.4 (L) 8.9 - 10.3 mg/dL   Total Protein 5.6 (L) 6.5 - 8.1 g/dL   Albumin 2.2 (L) 3.5 - 5.0 g/dL   AST 63 (H) 15 - 41 U/L   ALT 33 17 - 63 U/L   Alkaline Phosphatase 198 (H) 38 - 126 U/L   Total Bilirubin 1.4 (H) 0.3 - 1.2 mg/dL   GFR calc non Af Amer >60 >60 mL/min   GFR calc Af Amer >60 >60 mL/min   Anion gap 6 5 - 15  Ethanol  Status: None   Collection Time: 10/26/17  1:59 PM  Result Value Ref Range   Alcohol, Ethyl (B) <10 <10 mg/dL  Urinalysis, Routine w reflex microscopic     Status: None   Collection Time: 10/26/17  2:54 PM  Result Value Ref Range   Color, Urine YELLOW YELLOW   APPearance CLEAR CLEAR   Specific Gravity, Urine 1.013 1.005 - 1.030   pH 6.0 5.0 - 8.0   Glucose, UA NEGATIVE NEGATIVE mg/dL   Hgb urine dipstick NEGATIVE NEGATIVE   Bilirubin Urine NEGATIVE NEGATIVE   Ketones, ur NEGATIVE NEGATIVE mg/dL   Protein, ur NEGATIVE NEGATIVE mg/dL   Nitrite NEGATIVE NEGATIVE   Leukocytes, UA NEGATIVE NEGATIVE  Urine rapid drug screen (hosp performed)     Status: Abnormal   Collection Time: 10/26/17  2:55 PM  Result Value Ref Range   Opiates NONE DETECTED NONE DETECTED   Cocaine NONE DETECTED NONE DETECTED   Benzodiazepines POSITIVE (A) NONE DETECTED   Amphetamines NONE DETECTED NONE DETECTED   Tetrahydrocannabinol NONE DETECTED NONE DETECTED   Barbiturates POSITIVE (A) NONE DETECTED  Sedimentation rate     Status: Abnormal   Collection Time: 10/26/17  7:30 PM  Result Value Ref Range   Sed Rate 23 (H) 0 - 16 mm/hr  C-reactive protein     Status: None   Collection Time:  10/26/17  7:30 PM  Result Value Ref Range   CRP <0.8 <1.0 mg/dL     PHYSICAL EXAM:   Gen: awake and alert, NAD, sitting up in chair, appears well  Lungs: breathing unlabored Cardiac: RRR Ext:       Right Lower Extremity   Ex fix stable  pinsites look better     No drainage on kerlix  Dressing from traumatic wound removed   No expansion of erythema outside markings   No drainage or purulence noted  DPN, SPN, TN sensation grossly intact  EHL, FHL, lesser toe motor intact  Ext warm   + DP pulse  No DCT   Swelling stable   Assessment/Plan:     Principal Problem:   Cellulitis of right ankle Active Problems:   Tobacco abuse   Open pilon fracture of tibia   Marijuana abuse   Alcohol dependence ([facility])   Cirrhosis ([facility])   Hepatitis C, chronic ([facility])   Vitamin D deficiency   Hypogonadism in male   Anti-infectives (From admission, onward)   Start     Dose/Rate Route Frequency Ordered Stop   10/26/17 1930  piperacillin-tazobactam (ZOSYN) IVPB 3.375 g     3.375 g 12.5 mL/hr over 240 Minutes Intravenous Every 8 hours 10/26/17 1256     10/26/17 1300  piperacillin-tazobactam (ZOSYN) IVPB 3.375 g     3.375 g 100 mL/hr over 30 Minutes Intravenous  Once 10/26/17 1256 10/26/17 1431    .  POD/HD#: 34  67 year old male approximately 2 weeks post open right tibial plafond fracture treated with external fixation and intramedullary nailing of his right fibula admitted for cellulitis right ankle   -Cellulitis right ankle, s/p open R pilon fracture, tibia and fibula. Ex fix tibia and IMN fibula  Cellulitis looks marginally better  Continue with IV abx for another 72 hours at least. If responding well will then transition to Oral abx and monitor for another 24 to 48 hours to make sure pt continues to improve   NWB R leg  Therapies as tolerated  OOB ad lib  Ice and elevation      If  pt wants to avoid complications related to deep infection such as limb loss he really needs to  consider SNF. We believe he needs to be in a more controlled environment to prevent catastrophic complications                 - Pain management:             Percocet and OxyIR   - ABL anemia/Hemodynamics             Stable  - Medical issues              Nicotine dependence, alcohol abuse, hepatitis C   - DVT/PE prophylaxis:             Lovenox - ID:              Zosyn per pharmacy protocol  As above     - FEN/GI prophylaxis/Foley/Lines:             Regular diet             IV fluids   - Impediments to fracture healing:             Open fracture, nicotine use, alcohol use, drug use   - Dispo:             continue with inpatient care   SW consult    Will place consult on Monday    Communicated with SW already    Do not want pt to get upset and leave AMA until he has had more IV abx. We have very little margin to prevent complications    Jari Pigg, PA-C [facility] [phone] [name] (C) 10/27/2017, 1:58 PM

## 2017-10-27 NOTE — Evaluation (Signed)
Occupational Therapy Evaluation Patient Details [name] MRN: [id number] DOB: [birth year] Today's Date: 10/27/2017    History of Present Illness Glen Colon is an 67 y.o. white male well-known to the orthopedic trauma service for an open right pilon fracture approximately 2 weeks ago.  Patient was treated with external fixation and intramedullary nailing of his fibula due to severe soft tissue swelling and large traumatic wound medially.  Patient discharged from hospital on 10/18/2017.  Presented there was erythema around his wound.  No purulence.  He is admitted for cellulitis treatment with IV antibiotics given the very little margin due to his medical comorbidities.   Clinical Impression   Pt with decline in function and safety with decreased strength, balance and endurance. Pt is NWB in R LE with external fixator. Pt live st home alone and reports that he has intermittent assist from his son Glen Colon and ir sister who is an Therapist, sports. Pt with poor safety awareness and reports that he moves around in the house on a rolling office chair and that he can take care of himself just fine. Recommend that short term SNF is best d/c venue for pt, however he refuses SNF and HH therapies. OT will follow acutely    Follow Up Recommendations  SNF (pt refusing SNF and HH OT)   Equipment Recommendations  Management consultant)    Recommendations for Other Services       Precautions / Restrictions Precautions Precautions: Fall Precaution Comments: external fixator R LE  Restrictions Weight Bearing Restrictions: Yes RLE Weight Bearing: Non weight bearing      Mobility Bed Mobility               General bed mobility comments: pt OOB in chair upon arrival  Transfers Overall transfer level: Needs assistance Equipment used: Rolling walker (2 wheeled) Transfers: Sit to/from [facility] Sit to Stand: Min guard Stand pivot transfers: Min guard       General transfer comment: min guard  for safety; cues for safe hand placement and use of AD    Balance Overall balance assessment: Needs assistance Sitting-balance support: No upper extremity supported Sitting balance-Leahy Scale: Good     Standing balance support: Bilateral upper extremity supported;During functional activity Standing balance-Leahy Scale: Poor                             ADL either performed or assessed with clinical judgement   ADL Overall ADL's : Needs assistance/impaired Eating/Feeding: Set up;Sitting   Grooming: Wash/dry hands;Wash/dry face;Set up;Sitting   Upper Body Bathing: Set up;Sitting   Lower Body Bathing: Moderate assistance;Sitting/lateral leans   Upper Body Dressing : Set up;Sitting   Lower Body Dressing: Moderate assistance;Sitting/lateral leans   Toilet Transfer: RW;Min guard;Stand-pivot;Cueing for safety;BSC   Toileting- Water quality scientist and Hygiene: Sit to/from stand;Total assistance       Functional mobility during ADLs: Rolling walker;Min guard;Cueing for safety       Vision Baseline Vision/History: Wears glasses Wears Glasses: Reading only Patient Visual Report: No change from baseline       Perception     Praxis      Pertinent Vitals/Pain Pain Assessment: 0-10 Pain Score: 3  Pain Location: R LE  Pain Descriptors / Indicators: Sore Pain Intervention(s): Monitored during session;Premedicated before session;Repositioned     Hand Dominance Right   Extremity/Trunk Assessment Upper Extremity Assessment Upper Extremity Assessment: Overall WFL for tasks assessed   Lower Extremity Assessment  Lower Extremity Assessment: Defer to PT evaluation   Cervical / Trunk Assessment Cervical / Trunk Assessment: Normal   Communication Communication Communication: No difficulties   Cognition Arousal/Alertness: Awake/alert Behavior During Therapy: WFL for tasks assessed/performed Overall Cognitive Status: No family/caregiver present to determine  baseline cognitive functioning Area of Impairment: Safety/judgement                         Safety/Judgement: Decreased awareness of safety;Decreased awareness of deficits     General Comments: pt is NWB and reports that he uses a rolling office chair to move around in the house and that he does not need any help at home   General Comments       Exercises     Shoulder Instructions      Home Living Family/patient expects to be discharged to:: Private residence Living Arrangements: Alone Available Help at Discharge: Family;Available PRN/intermittently Type of Home: House Home Access: Stairs to enter [employer] of Steps: 2   Home Layout: One level     Bathroom Shower/Tub: Teacher, early years/pre: Standard     Home Equipment: Crutches;Bedside commode   Additional Comments: dog named Rosco lives outside      Prior Functioning/Environment Level of Independence: Independent with assistive device(s)  Gait / Transfers Assistance Needed: pt is NWB and reports that he uses a rolling office chair to move around in the house     Comments: sister is retired Therapist, sports (2x retired from [employer]) lives 10 minutes away and brother lives 2 houses away        OT Problem List: Decreased activity tolerance;Decreased knowledge of precautions;Decreased knowledge of use of DME or AE;Decreased safety awareness;Decreased strength;Pain      OT Treatment/Interventions: Self-care/ADL training;Therapeutic activities;Therapeutic exercise;Patient/family education;Balance training;DME and/or AE instruction    OT Goals(Current goals can be found in the care plan section) Acute Rehab OT Goals Patient Stated Goal: to return home asap OT Goal Formulation: With patient Time For Goal Achievement: 11/10/17 Potential to Achieve Goals: Good ADL Goals Pt Will Perform Lower Body Bathing: with min assist;with adaptive equipment;with caregiver independent in assisting Pt Will Perform  Lower Body Dressing: with min assist;with adaptive equipment;with caregiver independent in assisting Pt Will Transfer to Toilet: with supervision;stand pivot transfer;bedside commode Pt Will Perform Toileting - Clothing Manipulation and hygiene: with max assist;with mod assist;sitting/lateral leans;sit to/from stand;with caregiver independent in assisting  OT Frequency: Min 2X/week   Barriers to D/C: Decreased caregiver support  unknown level of assistance to be provided at home. Pt refuses SNF       Co-evaluation              AM-PAC PT "6 Clicks" Daily Activity     Outcome Measure Help from another person eating meals?: None Help from another person taking care of personal grooming?: A Little Help from another person toileting, which includes using toliet, bedpan, or urinal?: A Lot Help from another person bathing (including washing, rinsing, drying)?: A Lot Help from another person to put on and taking off regular upper body clothing?: A Lot Help from another person to put on and taking off regular lower body clothing?: A Lot 6 Click Score: 15   End of Session Equipment Utilized During Treatment: Gait belt;Rolling walker;Other (comment)(BSC)  Activity Tolerance: Patient tolerated treatment well Patient left: in chair;with call bell/phone within reach  OT Visit Diagnosis: Unsteadiness on feet (R26.81);Pain;Other abnormalities of gait and mobility (R26.89) Pain - Right/Left: Right  Pain - part of body: Leg                Time: 1101-1140 OT Time Calculation (min): 39 min Charges:  OT General Charges $OT Visit: 1 Visit OT Evaluation $OT Eval Moderate Complexity: 1 Mod OT Treatments $Self Care/Home Management : 8-22 mins $Therapeutic Activity: 8-22 mins G-Codes: OT G-codes **NOT FOR INPATIENT CLASS** Functional Assessment Tool Used: AM-PAC 6 Clicks Daily Activity     Britt Bottom 10/27/2017, 1:03 PM

## 2017-10-27 NOTE — Evaluation (Signed)
Physical Therapy Evaluation Patient Details [name] MRN: [id number] DOB: Jul 07, 1951 Today's Date: 10/27/2017   History of Present Illness  Glen Colon is an 66 y.o. white male well-known to the orthopedic trauma service for an open right pilon fracture approximately 2 weeks ago.  Patient was treated with external fixation and intramedullary nailing of his fibula due to severe soft tissue swelling and large traumatic wound medially.  Patient discharged from hospital on 10/18/2017.  Presented there was erythema around his wound.  No purulence.  He is admitted for cellulitis treatment with IV antibiotics given the very little margin due to his medical comorbidities.  Clinical Impression  Pt admitted with above diagnosis. Pt currently with functional limitations due to the deficits listed below (see PT Problem List). Pt is agreeable to work with PT in acute setting, states he does not need any help at home; He is able to amb 25-30' maneuvering RW in hospital room with min/guard to min assist for safety; Pt will benefit from skilled PT to increase their independence and safety with mobility to allow discharge to the venue listed below.       Follow Up Recommendations No PT follow up(pt does not want f/u, states he is managing at home and does not need help)    Equipment Recommendations  Other (comment)(pt has all DME)    Recommendations for Other Services       Precautions / Restrictions Precautions Precautions: Fall Precaution Comments: external fixator R LE  Restrictions Weight Bearing Restrictions: Yes RLE Weight Bearing: Non weight bearing      Mobility  Bed Mobility               General bed mobility comments: pt OOB in chair upon arrival  Transfers Overall transfer level: Needs assistance Equipment used: Rolling walker (2 wheeled) Transfers: Sit to/from Stand Sit to Stand: Min guard Stand pivot transfers: Min guard       General transfer comment: min  guard for safety; cues for safe hand placement and NWB on RLE  Ambulation/Gait Ambulation/Gait assistance: Min guard;Min assist Ambulation Distance (Feet): 25 Feet(in room) Assistive device: Rolling walker (2 wheeled)   Gait velocity: decreased   General Gait Details: cues for proximity of RW and safety; pt is dyspneic with activity ~3/4; PT put IS together and encouraged pt to use, pt able to return TEFL teacher    Modified Rankin (Stroke Patients Only)       Balance Overall balance assessment: Needs assistance Sitting-balance support: No upper extremity supported Sitting balance-Leahy Scale: Good     Standing balance support: Bilateral upper extremity supported;During functional activity Standing balance-Leahy Scale: Poor Standing balance comment: reliant on RW                             Pertinent Vitals/Pain Pain Assessment: 0-10 Pain Score: 3  Pain Location: R LE  Pain Descriptors / Indicators: Sore Pain Intervention(s): Monitored during session    Home Living Family/patient expects to be discharged to:: Private residence Living Arrangements: Alone Available Help at Discharge: Family;Available PRN/intermittently Type of Home: House Home Access: Stairs to enter   [employer] of Steps: 2 Home Layout: One level Home Equipment: Crutches;Bedside commode Additional Comments: dog named Rosco lives outside    Prior Function Level of Independence: Independent with assistive device(s)   Gait / Transfers Assistance Needed:  pt is NWB and reports that he uses a rolling office chair to move around in the house     Comments: sister is retired Therapist, sports (2x retired from [employer]) lives 10 minutes away and brother lives 2 houses away, son checks in on him on his way home from work most every day per pt     Hand Dominance   Dominant Hand: Right    Extremity/Trunk Assessment   Upper Extremity Assessment Upper  Extremity Assessment: Overall WFL for tasks assessed    Lower Extremity Assessment Lower Extremity Assessment: RLE deficits/detail RLE Deficits / Details: external fixator R Lower leg; knee and hip grossly 3+/5 RLE: Unable to fully assess due to immobilization RLE Coordination: decreased fine motor    Cervical / Trunk Assessment Cervical / Trunk Assessment: Normal  Communication   Communication: No difficulties  Cognition Arousal/Alertness: Awake/alert Behavior During Therapy: WFL for tasks assessed/performed Overall Cognitive Status: No family/caregiver present to determine baseline cognitive functioning Area of Impairment: Safety/judgement                         Safety/Judgement: Decreased awareness of safety;Decreased awareness of deficits     General Comments: pt is NWB and reports that he uses a rolling office chair to move around in the house and that he does not need any help at home; pt demonstrates imporved safety awareness during PT eval compared to OT eval earlier (per discussion with OT regarding pt impulsivity)      General Comments      Exercises     Assessment/Plan    PT Assessment Patient needs continued PT services  PT Problem List Decreased strength;Decreased activity tolerance;Decreased balance;Decreased mobility;Decreased coordination;Decreased knowledge of use of DME;Decreased safety awareness;Decreased knowledge of precautions;Cardiopulmonary status limiting activity;Decreased skin integrity       PT Treatment Interventions DME instruction;Gait training;Stair training;Functional mobility training;Therapeutic activities;Therapeutic exercise;Balance training;Patient/family education    PT Goals (Current goals can be found in the Care Plan section)  Acute Rehab PT Goals Patient Stated Goal: to return home asap PT Goal Formulation: With patient Potential to Achieve Goals: Good    Frequency Min 4X/week   Barriers to discharge         Co-evaluation               AM-PAC PT "6 Clicks" Daily Activity  Outcome Measure Difficulty turning over in bed (including adjusting bedclothes, sheets and blankets)?: A Little Difficulty moving from lying on back to sitting on the side of the bed? : A Little Difficulty sitting down on and standing up from a chair with arms (e.g., wheelchair, bedside commode, etc,.)?: Unable Help needed moving to and from a bed to chair (including a wheelchair)?: A Little Help needed walking in hospital room?: A Little Help needed climbing 3-5 steps with a railing? : A Lot 6 Click Score: 15    End of Session Equipment Utilized During Treatment: Gait belt Activity Tolerance: Patient limited by fatigue Patient left: in chair;with call bell/phone within reach   PT Visit Diagnosis: Unsteadiness on feet (R26.81);Muscle weakness (generalized) (M62.81);History of falling (Z91.81);Difficulty in walking, not elsewhere classified (R26.2)    Time: 4403-4742 PT Time Calculation (min) (ACUTE ONLY): 15 min   Charges:   PT Evaluation $PT Eval Low Complexity: 1 Low     PT G CodesKenyon Ana, PT Pager: [phone] 10/27/2017   [facility] 10/27/2017, 1:46 PM

## 2017-10-28 ENCOUNTER — Inpatient Hospital Stay (HOSPITAL_COMMUNITY): Payer: Medicaid Other

## 2017-10-28 MED ORDER — MORPHINE SULFATE (PF) 2 MG/ML IV SOLN: 1.0000 mg | INTRAVENOUS | Status: DC | PRN

## 2017-10-28 NOTE — Progress Notes (Signed)
Orthopedic Trauma Service Progress Note   Patient ID: Glen Colon MRN: [id number] DOB/AGE: May 03, 1951 67 y.o.  Subjective:  No acute changes  Tolerating IV abx   Debating SNF  Still very concerned about signing over his disability check to go to SNF   Review of Systems  Constitutional: Negative for chills and fever.  Cardiovascular: Negative for chest pain and palpitations.  Gastrointestinal: Negative for nausea and vomiting.   + desats with PT/OT but recovers rapidly with rest   History of L lung lobectomy 1995   Heavy tobacco user   Objective:   VITALS:   Vitals:   10/27/17 2100 10/28/17 0541 10/28/17 0807 10/28/17 1035  BP: (!) 109/57 116/69  (!) 114/59  Pulse: 79 81  79  Resp: 16  (!) 23 (!) 22  Temp: 98.6 F (37 C) 98.5 F (36.9 C)    TempSrc: Oral Oral    SpO2: 94% 94%      Estimated body mass index is 29.41 kg/m as calculated from the following:   Height as of 10/11/17: 5\' 10"  (1.778 m).   Weight as of 10/11/17: 93 kg (205 lb).   Intake/Output      01/31 0701 - 02/01 0700 02/01 0701 - 02/02 0700   P.O. 1440 480   Total Intake 1440 480   Urine 3900 1050   Stool 0    Total Output 3900 1050   Net -2460 -570        Stool Occurrence 1 x      LABS  No results found for this or any previous visit (from the past 24 hour(s)).   PHYSICAL EXAM:   Gen: patient in chair, NAD, stable appearing  Lungs: breathing unlabored in chair  Cardiac: RRR Ext:       Right Lower Extremity   Ex fix stable             pinsites look better                          No drainage on kerlix             Dressing from traumatic wound removed                        wound is stable    No drainage    No expansion of erythema   No odor              DPN, SPN, TN sensation grossly intact             EHL, FHL, lesser toe motor intact             Ext warm              + DP pulse             No DCT              Swelling  stable       Assessment/Plan:     Principal Problem:   Cellulitis of right ankle Active Problems:   Tobacco abuse   Open pilon fracture of tibia   Marijuana abuse   Alcohol dependence (HCC)   Cirrhosis (HCC)   Hepatitis C, chronic (HCC)   Vitamin D deficiency   Hypogonadism in male   Anti-infectives (From admission, onward)   Start     Dose/Rate Route Frequency Ordered Stop   10/26/17 1930  piperacillin-tazobactam (ZOSYN) IVPB 3.375 g     3.375 g 12.5 mL/hr over 240 Minutes Intravenous Every 8 hours 10/26/17 1256     10/26/17 1300  piperacillin-tazobactam (ZOSYN) IVPB 3.375 g     3.375 g 100 mL/hr over 30 Minutes Intravenous  Once 10/26/17 1256 10/26/17 1431    .  POD/HD#: 71  67 year old male approximately 2 weeks post open right tibial plafond fracture treated with external fixation and intramedullary nailing of his right fibula admitted for cellulitis right ankle   -Cellulitis right ankle, s/p open R pilon fracture, tibia and fibula. Ex fix tibia and IMN fibula             Cellulitis does not look any worse             adding vanc to IV regimen                NWB R leg             Therapies as tolerated             OOB ad lib             Ice and elevation    Next formal dressing change Monday    He may shower tomorrow: remove all dressings and wash leg gently with soap and water. Redress leg with aquacel ag dressing over traumatic wound, medial ankle. kerlix and ace from foot to mid lower leg and kerlix around pinsites                           If pt wants to avoid complications related to deep infection such as limb loss he really needs to consider SNF. We believe he needs to be in a more controlled environment to prevent catastrophic complications                         think there is a high probability that pt will require amputation. Fortunately at this time he shows no signs of sepsis or organ failure. There is no role for further surgical debridement at  this time because we would not be able to close wound if we operated through the traumatic wound again due to friability of soft tissue envelope    - Pain management:             Percocet and OxyIR   - ABL anemia/Hemodynamics             Stable   - Medical issues              Nicotine dependence, alcohol abuse, hepatitis C   Cxr    - DVT/PE prophylaxis:             Lovenox - ID:              zosyn  Add vanc    - FEN/GI prophylaxis/Foley/Lines:             Regular diet             IV fluids   - Impediments to fracture healing:             Open fracture, nicotine use, alcohol use, drug use   - Dispo:             continue with inpatient care, not stable for dc at this time  SW consult for SNF placement                           Jari Pigg, PA-C [facility] [phone] [name] (C) 10/28/2017, 11:55 AM

## 2017-10-28 NOTE — Plan of Care (Signed)
  Activity: Risk for activity intolerance will decrease 10/28/2017 1109 - Progressing by Williams Che, RN   Nutrition: Adequate nutrition will be maintained 10/28/2017 1109 - Progressing by Williams Che, RN   Coping: Level of anxiety will decrease 10/28/2017 1109 - Progressing by Williams Che, RN   Elimination: Will not experience complications related to bowel motility 10/28/2017 1109 - Progressing by Williams Che, RN   Pain Managment: General experience of comfort will improve 10/28/2017 1109 - Progressing by Williams Che, RN

## 2017-10-28 NOTE — Social Work (Signed)
CSW will continue to follow for disposition needs at appropriate time.  Pt still being medically managed.  CSW will continue to f/u.  Elissa Hefty, LCSW Clinical Social Worker [phone]

## 2017-10-28 NOTE — Progress Notes (Signed)
Physical Therapy Treatment Patient Details [name] MRN: [id number] DOB: 09-17-1951 Today's Date: 10/28/2017    History of Present Illness Glen Colon is an 67 y.o. white male well-known to the orthopedic trauma service for an open right pilon fracture approximately 2 weeks ago.  Patient was treated with external fixation and intramedullary nailing of his fibula due to severe soft tissue swelling and large traumatic wound medially.  Patient discharged from hospital on 10/18/2017.  Presented there was erythema around his wound.  No purulence.  He is admitted for cellulitis treatment with IV antibiotics given the very little margin due to his medical comorbidities.    PT Comments    Patient progressing slowly with therapy at this time. Pt with min guard level for OOB mobility, however de-sats quickly to low 80's on RA and becomes dyspneic within 1-2 minutes of activity. Returns to low 90's after short rest breaks. Discussed with patient rehab recommendations, pt agreeable to go to SNF after d/c, but only if the financials can be addressed as he expresses he cannot pay his mortgage and for rehab at same time. Updated d/c recs to SNF, and will pass info along to LSW. Pt demonstrating proper use of incentive spirometer at this time.    Follow Up Recommendations  SNF;Supervision for mobility/OOB     Equipment Recommendations  (TBD)    Recommendations for Other Services       Precautions / Restrictions Precautions Precautions: Fall Precaution Comments: external fixator R LE  Restrictions Weight Bearing Restrictions: Yes RLE Weight Bearing: Non weight bearing    Mobility  Bed Mobility Overal bed mobility: Independent Bed Mobility: Supine to Sit     Supine to sit: Min guard;Supervision     General bed mobility comments: pt able to supine to sit without physical assistance.   Transfers Overall transfer level: Needs assistance Equipment used: Rolling walker (2  wheeled) Transfers: Sit to/from Stand Sit to Stand: Min guard Stand pivot transfers: Min guard       General transfer comment: min guard for safety; cues for safe hand placement and NWB on RLE  Ambulation/Gait Ambulation/Gait assistance: Min guard Ambulation Distance (Feet): 40 Feet Assistive device: Rolling walker (2 wheeled) Gait Pattern/deviations: Step-to pattern Gait velocity: decreased   General Gait Details: cues for proximity of RW and safety; pt is dyspneic with activity ~3/4; reviewed IS with patient.    Stairs            Wheelchair Mobility    Modified Rankin (Stroke Patients Only)       Balance Overall balance assessment: Needs assistance Sitting-balance support: No upper extremity supported Sitting balance-Leahy Scale: Good     Standing balance support: Bilateral upper extremity supported;During functional activity Standing balance-Leahy Scale: Poor Standing balance comment: reliant on RW                            Cognition Arousal/Alertness: Awake/alert Behavior During Therapy: WFL for tasks assessed/performed Overall Cognitive Status: No family/caregiver present to determine baseline cognitive functioning Area of Impairment: Safety/judgement                         Safety/Judgement: Decreased awareness of safety;Decreased awareness of deficits Awareness: Emergent          Exercises      General Comments General comments (skin integrity, edema, etc.): SpO2= 91 on RA, 81 with 1-3 minutes of activity. patient becomes  short of breath very quickly.       Pertinent Vitals/Pain Pain Assessment: Faces Faces Pain Scale: Hurts a little bit Pain Location: R LE  Pain Descriptors / Indicators: Sore Pain Intervention(s): Limited activity within patient's tolerance;Monitored during session    Home Living                      Prior Function            PT Goals (current goals can now be found in the care plan  section) Acute Rehab PT Goals Patient Stated Goal: to return home asap PT Goal Formulation: With patient Time For Goal Achievement: 10/28/17 Potential to Achieve Goals: Good Progress towards PT goals: Progressing toward goals    Frequency    Min 4X/week      PT Plan Current plan remains appropriate    Co-evaluation              AM-PAC PT "6 Clicks" Daily Activity  Outcome Measure  Difficulty turning over in bed (including adjusting bedclothes, sheets and blankets)?: A Little Difficulty moving from lying on back to sitting on the side of the bed? : A Little Difficulty sitting down on and standing up from a chair with arms (e.g., wheelchair, bedside commode, etc,.)?: A Little Help needed moving to and from a bed to chair (including a wheelchair)?: A Little Help needed walking in hospital room?: A Little Help needed climbing 3-5 steps with a railing? : A Lot 6 Click Score: 17    End of Session Equipment Utilized During Treatment: Gait belt Activity Tolerance: Patient limited by fatigue Patient left: in chair;with call bell/phone within reach Nurse Communication: Mobility status PT Visit Diagnosis: Unsteadiness on feet (R26.81);Muscle weakness (generalized) (M62.81);History of falling (Z91.81);Difficulty in walking, not elsewhere classified (R26.2) Pain - Right/Left: Right Pain - part of body: Leg     Time: 0981-1914 PT Time Calculation (min) (ACUTE ONLY): 20 min  Charges:  $Gait Training: 8-22 mins                    G Codes:      Reinaldo Berber, PT, DPT Acute Rehab Services Pager: [phone]    Reinaldo Berber 10/28/2017, 9:43 AM

## 2017-10-28 NOTE — Progress Notes (Signed)
Occupational Therapy Treatment Patient Details [name] MRN: [id number] DOB: [birth year] Today's Date: 10/28/2017    History of present illness Glen Colon is an 67 y.o. white male well-known to the orthopedic trauma service for an open right pilon fracture approximately 2 weeks ago.  Patient was treated with external fixation and intramedullary nailing of his fibula due to severe soft tissue swelling and large traumatic wound medially.  Patient discharged from hospital on 10/18/2017.  Presented there was erythema around his wound.  No purulence.  He is admitted for cellulitis treatment with IV antibiotics given the very little margin due to his medical comorbidities.   OT comments  Pt progressing towards acute OT goals. Focus of session was functional mobility/transfers and bed mobility and review of LB dressing technique. Pt declined further activity "my belly's so full from lunch." Pt's sister present during session. D/c plan to SNF remains appropriate.    Follow Up Recommendations  SNF    Equipment Recommendations       Recommendations for Other Services      Precautions / Restrictions Precautions Precautions: Fall Precaution Comments: external fixator R LE  Restrictions Weight Bearing Restrictions: Yes RLE Weight Bearing: Non weight bearing       Mobility Bed Mobility Overal bed mobility: Independent Bed Mobility: Sit to Supine       Sit to supine: Min guard;HOB elevated      Transfers Overall transfer level: Needs assistance Equipment used: Rolling walker (2 wheeled) Transfers: Sit to/from Stand Sit to Stand: Min guard         General transfer comment: some unsteadiness on initial stand, pt able to self-correct    Balance Overall balance assessment: Needs assistance Sitting-balance support: No upper extremity supported Sitting balance-Leahy Scale: Good     Standing balance support: Bilateral upper extremity supported;During functional  activity Standing balance-Leahy Scale: Poor Standing balance comment: reliant on RW                           ADL either performed or assessed with clinical judgement   ADL Overall ADL's : Needs assistance/impaired                                       General ADL Comments: Pt ambulated recliner to EOB, bed mobility as detailed above. Declined further activity "my belly's so full from eating."     Vision       Perception     Praxis      Cognition Arousal/Alertness: Awake/alert Behavior During Therapy: WFL for tasks assessed/performed Overall Cognitive Status: No family/caregiver present to determine baseline cognitive functioning Area of Impairment: Safety/judgement                         Safety/Judgement: Decreased awareness of safety;Decreased awareness of deficits Awareness: Emergent            Exercises     Shoulder Instructions       General Comments DOE once reached the bed. Encouraged pursed lip breathing, recovered to baseline breathing.     Pertinent Vitals/ Pain       Pain Assessment: 0-10 Pain Score: 4  Pain Location: R LE  Pain Descriptors / Indicators: Sore Pain Intervention(s): Monitored during session;Repositioned  Home Living  Prior Functioning/Environment              Frequency  Min 2X/week        Progress Toward Goals  OT Goals(current goals can now be found in the care plan section)  Progress towards OT goals: Progressing toward goals  Acute Rehab OT Goals Patient Stated Goal: to return home asap OT Goal Formulation: With patient Time For Goal Achievement: 11/10/17 Potential to Achieve Goals: Good ADL Goals Pt Will Perform Lower Body Bathing: with min assist;with adaptive equipment;with caregiver independent in assisting Pt Will Perform Lower Body Dressing: with min assist;with adaptive equipment;with caregiver independent in  assisting Pt Will Transfer to Toilet: with supervision;stand pivot transfer;bedside commode Pt Will Perform Toileting - Clothing Manipulation and hygiene: with max assist;with mod assist;sitting/lateral leans;sit to/from stand;with caregiver independent in assisting Pt Will Perform Tub/Shower Transfer: Tub transfer;3 in 1;rolling walker;with supervision Additional ADL Goal #1: Pt will complete bed mobility supervision level for adls  Plan Discharge plan remains appropriate    Co-evaluation                 AM-PAC PT "6 Clicks" Daily Activity     Outcome Measure   Help from another person eating meals?: None Help from another person taking care of personal grooming?: A Little Help from another person toileting, which includes using toliet, bedpan, or urinal?: A Lot Help from another person bathing (including washing, rinsing, drying)?: A Lot Help from another person to put on and taking off regular upper body clothing?: A Lot Help from another person to put on and taking off regular lower body clothing?: A Lot 6 Click Score: 15    End of Session Equipment Utilized During Treatment: Rolling walker  OT Visit Diagnosis: Unsteadiness on feet (R26.81);Pain;Other abnormalities of gait and mobility (R26.89) Pain - Right/Left: Right Pain - part of body: Leg   Activity Tolerance Patient tolerated treatment well   Patient Left in bed;with call bell/phone within reach;with family/visitor present   Nurse Communication          Time: 3568-6168 OT Time Calculation (min): 10 min  Charges: OT General Charges $OT Visit: 1 Visit OT Treatments $Self Care/Home Management : 8-22 mins     Hortencia Pilar 10/28/2017, 1:22 PM

## 2017-10-29 LAB — COMPREHENSIVE METABOLIC PANEL
ALT: 28 U/L (ref 17–63)
AST: 51 U/L — ABNORMAL HIGH (ref 15–41)
Albumin: 2.1 g/dL — ABNORMAL LOW (ref 3.5–5.0)
Alkaline Phosphatase: 209 U/L — ABNORMAL HIGH (ref 38–126)
Anion gap: 5 (ref 5–15)
BILIRUBIN TOTAL: 1.3 mg/dL — AB (ref 0.3–1.2)
BUN: 7 mg/dL (ref 6–20)
CHLORIDE: 105 mmol/L (ref 101–111)
CO2: 27 mmol/L (ref 22–32)
CREATININE: 0.76 mg/dL (ref 0.61–1.24)
Calcium: 8.3 mg/dL — ABNORMAL LOW (ref 8.9–10.3)
Glucose, Bld: 78 mg/dL (ref 65–99)
POTASSIUM: 3.9 mmol/L (ref 3.5–5.1)
Sodium: 137 mmol/L (ref 135–145)
TOTAL PROTEIN: 5.5 g/dL — AB (ref 6.5–8.1)

## 2017-10-29 LAB — CBC WITH DIFFERENTIAL/PLATELET
Basophils Absolute: 0 10*3/uL (ref 0.0–0.1)
Basophils Relative: 0 %
Eosinophils Absolute: 0.1 10*3/uL (ref 0.0–0.7)
Eosinophils Relative: 2 %
HEMATOCRIT: 34.3 % — AB (ref 39.0–52.0)
Hemoglobin: 11.4 g/dL — ABNORMAL LOW (ref 13.0–17.0)
LYMPHS ABS: 1.4 10*3/uL (ref 0.7–4.0)
LYMPHS PCT: 34 %
MCH: 35.1 pg — AB (ref 26.0–34.0)
MCHC: 33.2 g/dL (ref 30.0–36.0)
MCV: 105.5 fL — AB (ref 78.0–100.0)
MONO ABS: 0.4 10*3/uL (ref 0.1–1.0)
MONOS PCT: 10 %
Neutro Abs: 2.2 10*3/uL (ref 1.7–7.7)
Neutrophils Relative %: 54 %
PLATELETS: 80 10*3/uL — AB (ref 150–400)
RBC: 3.25 MIL/uL — ABNORMAL LOW (ref 4.22–5.81)
RDW: 14.7 % (ref 11.5–15.5)
WBC: 4 10*3/uL (ref 4.0–10.5)

## 2017-10-29 NOTE — Progress Notes (Signed)
Orthopedic Progress Note   Patient ID: Glen Colon MRN: [id number] DOB/AGE: Feb 24, 1951 67 y.o.  Subjective: No acute events.  Patient feels quite well.  No complaints.  No fever or chills.  No CP, shortness of breath.  Considering SNF-has been concerned about signing over his disability check to go to SNF   Heavy tobacco user and history of Left lung lobectomy 1995:  -Discussed cessation.  He does not plan on resuming tobacco use.  Objective:   VITALS:   Vitals:   10/28/17 1035 10/28/17 1300 10/28/17 2001 10/29/17 0521  BP: (!) 114/59 123/63 108/64 (!) 110/58  Pulse: 79 77 79 75  Resp: (!) 22 19 18 17   Temp:  97.9 F (36.6 C) 98.1 F (36.7 C) 97.6 F (36.4 C)  TempSrc:  Oral Oral Axillary  SpO2:  95% 98% 99%    Estimated body mass index is 29.41 kg/m as calculated from the following:   Height as of 10/11/17: 5\' 10"  (1.778 m).   Weight as of 10/11/17: 93 kg (205 lb).   Intake/Output      02/01 0701 - 02/02 0700 02/02 0701 - 02/03 0700   P.[place] 500    IV Piggyback 50    Total Intake 2470    Urine 5000 700   Stool     Total Output 5000 700   Net -2530 -700        Urine Occurrence 2 x      LABS  Results for orders placed or performed during the hospital encounter of 10/26/17 (from the past 24 hour(s))  CBC with Differential/Platelet     Status: Abnormal   Collection Time: 10/29/17  4:23 AM  Result Value Ref Range   WBC 4.0 4.0 - 10.5 K/uL   RBC 3.25 (L) 4.22 - 5.81 MIL/uL   Hemoglobin 11.4 (L) 13.0 - 17.0 g/dL   HCT 34.3 (L) 39.0 - 52.0 %   MCV 105.5 (H) 78.0 - 100.0 fL   MCH 35.1 (H) 26.0 - 34.0 pg   MCHC 33.2 30.0 - 36.0 g/dL   RDW 14.7 11.5 - 15.5 %   Platelets 80 (L) 150 - 400 K/uL   Neutrophils Relative % 54 %   Neutro Abs 2.2 1.7 - 7.7 K/uL   Lymphocytes Relative 34 %   Lymphs Abs 1.4 0.7 - 4.0 K/uL   Monocytes Relative 10 %   Monocytes Absolute 0.4 0.1 - 1.0 K/uL   Eosinophils Relative 2 %   Eosinophils Absolute 0.1 0.0 - 0.7 K/uL   Basophils Relative 0 %   Basophils Absolute 0.0 0.0 - 0.1 K/uL  Comprehensive metabolic panel     Status: Abnormal   Collection Time: 10/29/17  4:23 AM  Result Value Ref Range   Sodium 137 135 - 145 mmol/L   Potassium 3.9 3.5 - 5.1 mmol/L   Chloride 105 101 - 111 mmol/L   CO2 27 22 - 32 mmol/L   Glucose, Bld 78 65 - 99 mg/dL   BUN 7 6 - 20 mg/dL   Creatinine, Ser 0.76 0.61 - 1.24 mg/dL   Calcium 8.3 (L) 8.9 - 10.3 mg/dL   Total Protein 5.5 (L) 6.5 - 8.1 g/dL   Albumin 2.1 (L) 3.5 - 5.0 g/dL   AST 51 (H) 15 - 41 U/L   ALT 28 17 - 63 U/L   Alkaline Phosphatase 209 (H) 38 - 126 U/L   Total Bilirubin 1.3 (H) 0.3 - 1.2 mg/dL  GFR calc non Af Amer >60 >60 mL/min   GFR calc Af Amer >60 >60 mL/min   Anion gap 5 5 - 15     PHYSICAL EXAM:  General: Patient upright in bed.  Calm, talkative, pleasant Pulm: no increased work of breathing CV: RRR RLE: Ex-fix in place.  Dressings and pin sites stable without drainage. Traumatic wound dressing also CDI.  No expanding erythema, drainage, odor. Foot with mild to moderate expected/stable swelling.  Foot warm.  EHL FHL intact.  Assessment:    Principal Problem:   Cellulitis of right ankle Active Problems:   Tobacco abuse   Open pilon fracture of tibia   Marijuana abuse   Alcohol dependence (HCC)   Cirrhosis (HCC)   Hepatitis C, chronic (HCC)   Vitamin D deficiency   Hypogonadism in male   Anti-infectives (From admission, onward)   Start     Dose/Rate Route Frequency Ordered Stop   10/26/17 1930  piperacillin-tazobactam (ZOSYN) IVPB 3.375 g     3.375 g 12.5 mL/hr over 240 Minutes Intravenous Every 8 hours 10/26/17 1256     10/26/17 1300  piperacillin-tazobactam (ZOSYN) IVPB 3.375 g     3.375 g 100 mL/hr over 30 Minutes Intravenous  Once 10/26/17 1256 10/26/17 1431    .  POD/HD#: 60  67 year old male approximately 2 weeks post open right tibial plafond fracture treated with external fixation and intramedullary nailing of his  right fibula admitted for cellulitis right ankle   Cellulitis Right Ankle s/p open R pilon fracture, tibia and fibula. Ex fix tibia and IMN fibula               Dressings/pin sites clean  No signs of sepsis/organ failure.  He says that he feels quite well.  No role for surgical debridement at this time due to tenuous soft tissue possible          Plan:             NWB R leg             Therapies as tolerated             OOB ad lib             Ice and elevation    Incentive spirometry  Next formal dressing change Monday    He may shower Saturday, 10/29/2017: remove all dressings and wash leg gently with soap and water. Redress leg with aquacel ag dressing over traumatic wound, medial ankle. kerlix and ace from foot to mid lower leg and kerlix around pinsites.               From KP, PA-C previous note:             "If pt wants to avoid complications related to deep infection such as limb loss he really needs to consider SNF. We believe he needs to be in a more controlled environment to prevent catastrophic complications.  There is a high probability that pt will require amputation."   - Pain management:             Percocet and OxyIR   - ABL anemia/Hemodynamics             Stable   - Medical issues              Nicotine dependence, alcohol abuse, hepatitis C  Cxr favors atelectasis-pulmonary toilet/incentive spirometry   - DVT/PE prophylaxis:  Lovenox - ID:              Vanc/zosyn   - FEN/GI prophylaxis/Foley/Lines:             Regular diet             IV fluids   - Impediments to fracture healing:             Open fracture, nicotine use, alcohol use, drug use   Dispo:             Continue IV antibiotics through weekend.  Continue inpatient care.  SW consult for possible SNF placement.

## 2017-10-29 NOTE — Plan of Care (Signed)
  Activity: Risk for activity intolerance will decrease 10/29/2017 1220 - Progressing by Williams Che, RN   Nutrition: Adequate nutrition will be maintained 10/29/2017 1220 - Progressing by Williams Che, RN   Coping: Level of anxiety will decrease 10/29/2017 1220 - Progressing by Williams Che, RN   Elimination: Will not experience complications related to bowel motility 10/29/2017 1220 - Progressing by Williams Che, RN   Pain Managment: General experience of comfort will improve 10/29/2017 1220 - Progressing by Williams Che, RN   Safety: Ability to remain free from injury will improve 10/29/2017 1220 - Progressing by Williams Che, RN

## 2017-10-29 NOTE — Progress Notes (Signed)
Pt was educated about the importance of ice packs.  Pt refused them stating "they hurt my leg."

## 2017-10-30 LAB — NICOTINE/COTININE METABOLITES
Cotinine: 25.2 ng/mL
Nicotine: NOT DETECTED ng/mL

## 2017-10-30 NOTE — Plan of Care (Signed)
  Progressing Education: Knowledge of General Education information will improve 10/30/2017 1550 - Progressing by Rance Muir, RN Health Behavior/Discharge Planning: Ability to manage health-related needs will improve 10/30/2017 1550 - Progressing by Rance Muir, RN Clinical Measurements: Ability to maintain clinical measurements within normal limits will improve 10/30/2017 1550 - Progressing by Rance Muir, RN Will remain free from infection 10/30/2017 1550 - Progressing by Rance Muir, RN Diagnostic test results will improve 10/30/2017 1550 - Progressing by Rance Muir, RN Respiratory complications will improve 10/30/2017 1550 - Progressing by Rance Muir, RN Cardiovascular complication will be avoided 10/30/2017 1550 - Progressing by Rance Muir, RN Activity: Risk for activity intolerance will decrease 10/30/2017 1550 - Progressing by Rance Muir, RN Nutrition: Adequate nutrition will be maintained 10/30/2017 1550 - Progressing by Rance Muir, RN Coping: Level of anxiety will decrease 10/30/2017 1550 - Progressing by Rance Muir, RN Elimination: Will not experience complications related to bowel motility 10/30/2017 1550 - Progressing by Rance Muir, RN Will not experience complications related to urinary retention 10/30/2017 1550 - Progressing by Rance Muir, RN Pain Managment: General experience of comfort will improve 10/30/2017 1550 - Progressing by Rance Muir, RN Safety: Ability to remain free from injury will improve 10/30/2017 1550 - Progressing by Rance Muir, RN Skin Integrity: Risk for impaired skin integrity will decrease 10/30/2017 1550 - Progressing by Rance Muir, RN

## 2017-10-30 NOTE — Progress Notes (Signed)
Orthopedic Progress Note   Patient ID: Glen Colon MRN: [id number] DOB/AGE: [birth year] 67 y.o.  Subjective: No acute events.  Glen Colon had a shower and dressing change last night.  Feels "great...much better."   No complaints.  No fever or chills.  No CP, shortness of breath.  Considering SNF-has been concerned about signing over his disability check to go to SNF   Heavy tobacco user and history of Left lung lobectomy 1995:  -Discussed cessation.  Glen Colon does not plan on resuming tobacco use.  Objective:   VITALS:   Vitals:   10/29/17 0521 10/29/17 1258 10/29/17 1946 10/30/17 0503  BP: (!) 110/58 (!) 111/59 124/66 (!) 112/57  Pulse: 75 75 73 74  Resp: 17 16    Temp: 97.6 F (36.4 C) 98.1 F (36.7 C) 97.8 F (36.6 C) 98 F (36.7 C)  TempSrc: Axillary Oral Oral Oral  SpO2: 99% 95% 96% 92%    Estimated body mass index is 29.41 kg/m as calculated from the following:   Height as of 10/11/17: 5\' 10"  (1.778 m).   Weight as of 10/11/17: 93 kg (205 lb).   Intake/Output      02/02 0701 - 02/03 0700 02/03 0701 - 02/04 0700   P.O.     I.V.     IV Piggyback     Total Intake     Urine 2440    Total Output 2440    Net -2440           LABS  No results found for this or any previous visit (from the past 24 hour(s)).   PHYSICAL EXAM:  General: Patient upright in bed.  Calm, talkative, pleasant Pulm: no increased work of breathing CV: RRR RLE: Ex-fix in place.  Dressings and pin sites stable without drainage. Traumatic wound dressing also CDI.  No expanding erythema, drainage, odor. Foot with mild to moderate expected/stable swelling.  Foot warm.  EHL FHL intact.  Assessment:    Principal Problem:   Cellulitis of right ankle Active Problems:   Tobacco abuse   Open pilon fracture of tibia   Marijuana abuse   Alcohol dependence (HCC)   Cirrhosis (HCC)   Hepatitis C, chronic (HCC)   Vitamin D deficiency   Hypogonadism in male   Anti-infectives (From admission, onward)   Start     Dose/Rate Route Frequency Ordered Stop   10/26/17 1930  piperacillin-tazobactam (ZOSYN) IVPB 3.375 g     3.375 g 12.5 mL/hr over 240 Minutes Intravenous Every 8 hours 10/26/17 1256     10/26/17 1300  piperacillin-tazobactam (ZOSYN) IVPB 3.375 g     3.375 g 100 mL/hr over 30 Minutes Intravenous  Once 10/26/17 1256 10/26/17 1431    .  POD/HD#: 21  67 year old male approximately 2 weeks post open right tibial plafond fracture treated with external fixation and intramedullary nailing of his right fibula admitted for cellulitis right ankle   Cellulitis Right Ankle s/p open R pilon fracture, tibia and fibula. Ex fix tibia and IMN fibula               Dressings/pin sites clean  No signs of sepsis/organ failure.  Glen Colon says that Glen Colon feels quite well.  No role for surgical debridement at this time due to tenuous soft tissue possible          Plan:             NWB R leg  Therapies as tolerated             OOB ad lib             Ice and elevation    Incentive spirometry  Next formal dressing change Monday               From KP, PA-C previous note:             "If pt wants to avoid complications related to deep infection such as limb loss Glen Colon really needs to consider SNF. We believe Glen Colon needs to be in a more controlled environment to prevent catastrophic complications.  There is a high probability that pt will require amputation."   - Pain management:             Percocet and OxyIR   - ABL anemia/Hemodynamics             Stable   - Medical issues              Nicotine dependence, alcohol abuse, hepatitis C  Cxr favors atelectasis-pulmonary toilet/incentive spirometry   - DVT/PE prophylaxis:             Lovenox - ID:              Vanc/zosyn   - FEN/GI prophylaxis/Foley/Lines:             Regular diet             IV fluids   - Impediments to fracture healing:             Open fracture, nicotine use, alcohol use, drug use   Dispo:             Continue IV  antibiotics through weekend.  Continue inpatient care.  SW consult for possible SNF placement.

## 2017-10-31 MED ORDER — DOXYCYCLINE HYCLATE 100 MG PO TABS
100.0000 mg | ORAL_TABLET | Freq: Two times a day (BID) | ORAL | Status: DC
  Administered 2017-10-31 – 2017-11-01 (×3): 100 mg via ORAL
  Filled 2017-10-31 (×3): qty 1

## 2017-10-31 MED ORDER — LEVOFLOXACIN 500 MG PO TABS
500.0000 mg | ORAL_TABLET | Freq: Every day | ORAL | Status: DC
  Administered 2017-10-31 – 2017-11-01 (×2): 500 mg via ORAL
  Filled 2017-10-31 (×2): qty 1

## 2017-10-31 NOTE — Progress Notes (Signed)
Physical Therapy Treatment Patient Details [name] MRN: [id number] DOB: 11/12/1950 Today's Date: 10/31/2017    History of Present Illness Glen Colon is an 67 y.o. white male well-known to the orthopedic trauma service for an open right pilon fracture approximately 2 weeks ago.  Patient was treated with external fixation and intramedullary nailing of his fibula due to severe soft tissue swelling and large traumatic wound medially.  Patient discharged from hospital on 10/18/2017.  Presented there was erythema around his wound.  No purulence.  He is admitted for cellulitis treatment with IV antibiotics given the very little margin due to his medical comorbidities.    PT Comments    Patient tolerated increased gait distance however with 3/4 DOE and rest breaks required. Pt encouraged to increase mobility as he reported rolling in office chair most of the time when he was at home PTA.  Continue to progress as tolerated.   Follow Up Recommendations  SNF;Supervision for mobility/OOB     Equipment Recommendations  (TBD)    Recommendations for Other Services       Precautions / Restrictions Precautions Precautions: Fall Precaution Comments: external fixator R LE  Restrictions Weight Bearing Restrictions: Yes RLE Weight Bearing: Non weight bearing    Mobility  Bed Mobility Overal bed mobility: Independent             General bed mobility comments: note that patient is not elevating R LE enough. Pt educated on the need to use pillows/ blankets to keep foot above knee.   Transfers Overall transfer level: Needs assistance Equipment used: Rolling walker (2 wheeled) Transfers: Sit to/from Stand Sit to Stand: Min guard         General transfer comment: min guard for safety  Ambulation/Gait Ambulation/Gait assistance: Min guard Ambulation Distance (Feet): 80 Feet Assistive device: Rolling walker (2 wheeled) Gait Pattern/deviations: Step-to pattern Gait velocity:  decreased   General Gait Details: cues for sequencing and posture; pt with 3/4 DOE and required several standing rest breaks   Stairs            Wheelchair Mobility    Modified Rankin (Stroke Patients Only)       Balance Overall balance assessment: Needs assistance Sitting-balance support: No upper extremity supported Sitting balance-Leahy Scale: Good     Standing balance support: Bilateral upper extremity supported;During functional activity Standing balance-Leahy Scale: Poor Standing balance comment: reliant on RW                            Cognition Arousal/Alertness: Awake/alert Behavior During Therapy: WFL for tasks assessed/performed Overall Cognitive Status: Impaired/Different from baseline Area of Impairment: Safety/judgement                         Safety/Judgement: Decreased awareness of safety     General Comments: Pt resting R LE on the floor and lacks awareness to fall risk by abandoning the RW       Exercises      General Comments General comments (skin integrity, edema, etc.): pt reports lateral aspect of calf with pain in the night and questions blood clot. OT touching bil le with same temperature no increase edema noted and pt self reports "its better this morning" . Pt states "i dont know last night i thought i had a blood clot but this morning its better"      Pertinent Vitals/Pain Pain Assessment: Faces Faces Pain  Scale: Hurts little more Pain Location: bilat UE while ambulating  Pain Descriptors / Indicators: Sore Pain Intervention(s): Limited activity within patient's tolerance;Monitored during session;Repositioned    Home Living                      Prior Function            PT Goals (current goals can now be found in the care plan section) Acute Rehab PT Goals Patient Stated Goal: to return home asap PT Goal Formulation: With patient Time For Goal Achievement: 10/28/17 Potential to Achieve Goals:  Good Progress towards PT goals: Progressing toward goals    Frequency    Min 4X/week      PT Plan Current plan remains appropriate    Co-evaluation              AM-PAC PT "6 Clicks" Daily Activity  Outcome Measure  Difficulty turning over in bed (including adjusting bedclothes, sheets and blankets)?: A Little Difficulty moving from lying on back to sitting on the side of the bed? : A Little Difficulty sitting down on and standing up from a chair with arms (e.g., wheelchair, bedside commode, etc,.)?: A Little Help needed moving to and from a bed to chair (including a wheelchair)?: A Little Help needed walking in hospital room?: A Little Help needed climbing 3-5 steps with a railing? : A Lot 6 Click Score: 17    End of Session Equipment Utilized During Treatment: Gait belt Activity Tolerance: Patient tolerated treatment well Patient left: in chair;with call bell/phone within reach Nurse Communication: Mobility status PT Visit Diagnosis: Unsteadiness on feet (R26.81);Muscle weakness (generalized) (M62.81);History of falling (Z91.81);Difficulty in walking, not elsewhere classified (R26.2) Pain - Right/Left: Right Pain - part of body: Leg     Time: 1610-9604 PT Time Calculation (min) (ACUTE ONLY): 20 min  Charges:  $Gait Training: 8-22 mins                    G Codes:       Earney Navy, PTA Pager: [phone]     Darliss Cheney 10/31/2017, 10:54 AM

## 2017-10-31 NOTE — Progress Notes (Signed)
Orthopedic Trauma Service Progress Note   Patient ID: Glen Colon MRN: [id number] DOB/AGE: [birth year] 67 y.o.  Subjective:  Feels much better Pain dramatically improved  Has been able to shower   Appetite is good  No specific complaints  REFUSING SNF   Review of Systems  Constitutional: Negative for chills and fever.  Respiratory: Negative for shortness of breath and wheezing.   Cardiovascular: Negative for chest pain and palpitations.  Gastrointestinal: Negative for abdominal pain, nausea and vomiting.  Neurological: Negative for tingling and sensory change.    Objective:   VITALS:   Vitals:   10/30/17 0503 10/30/17 1300 10/30/17 1946 10/31/17 0430  BP: (!) 112/57 113/63 115/63 99/60  Pulse: 74 82 74 60  Resp:  18 17 14   Temp: 98 F (36.7 C) 98.8 F (37.1 C) 98.5 F (36.9 C) 97.6 F (36.4 C)  TempSrc: Oral Oral Oral Oral  SpO2: 92% 94% 98% 95%    Estimated body mass index is 29.41 kg/m as calculated from the following:   Height as of 10/11/17: 5\' 10"  (1.778 m).   Weight as of 10/11/17: 93 kg (205 lb).   Intake/Output      02/03 0701 - 02/04 0700 02/04 0701 - 02/05 0700   P.O. 4560    Total Intake 4560    Urine 4800    Total Output 4800    Net -240           LABS  No results found for this or any previous visit (from the past 24 hour(s)).   PHYSICAL EXAM:   Gen: sitting up in bedside chair, appears well, NAD  Lungs: breathing unlabored Cardiac: RRR Abd: NTND, + BS Ext:       Right Lower Extremity   Ex fix stable  pinsites look improved and good   Traumatic wound appears better   Eschar stable along traumatic wound line  Swelling improved   Ext warm  + DP pulse  No DCT  Compartments soft  DPN, SPN, TN sensation intact  EHL, FHL, lesser toe motor intact   Assessment/Plan:     Principal Problem:   Cellulitis of right ankle Active Problems:   Tobacco abuse   Open pilon fracture of tibia   Marijuana  abuse   Alcohol dependence (HCC)   Cirrhosis (HCC)   Hepatitis C, chronic (HCC)   Vitamin D deficiency   Hypogonadism in male   Anti-infectives (From admission, onward)   Start     Dose/Rate Route Frequency Ordered Stop   10/26/17 1930  piperacillin-tazobactam (ZOSYN) IVPB 3.375 g     3.375 g 12.5 mL/hr over 240 Minutes Intravenous Every 8 hours 10/26/17 1256     10/26/17 1300  piperacillin-tazobactam (ZOSYN) IVPB 3.375 g     3.375 g 100 mL/hr over 30 Minutes Intravenous  Once 10/26/17 1256 10/26/17 1431    .  POD/HD#: 10  67 year old male approximately 2 weeks post open right tibial plafond fracture treated with external fixation and intramedullary nailing of his right fibula admitted for cellulitis right ankle   -Cellulitis right ankle, s/p open R pilon fracture, tibia and fibula. Ex fix tibia and IMN fibula             Cellulitis looks improved              transition to POs today   Will remain hospitalized for another 24 hours at least to make sure he does not regress with PO abxs  NWB R leg             Therapies as tolerated             OOB ad lib             Ice and elevation               dressing changed today, change dressing again tomorrow   New aquacel ag placed over traumatic wound    Compressive wrap                Ok to shower: remove all dressings and wash leg gently with soap and water. Redress leg with aquacel ag dressing over traumatic wound, medial ankle. kerlix and ace from foot to mid lower leg and kerlix around pinsites               Pt refusing SNF  Still believe there is a high probability of complications that could lead to limb loss. In spite of reviewing this with the pt, he still wishes to go home    Labs confirm that pt has been smoking PTA. His nicotine level is undetected which is appropriate as he has been in the hospital for 5 days and has not had any nicotine/nicotine products. Cotinine levels are positive indicating nicotine use  in last 10 days or so    - Pain management:             Percocet and OxyIR   - ABL anemia/Hemodynamics             Stable   - Medical issues              Nicotine dependence, alcohol abuse, hepatitis C               Cxr- ATX   - DVT/PE prophylaxis:             Lovenox - ID:              transition to POs- doxycycline and levaquin   - FEN/GI prophylaxis/Foley/Lines:             Regular diet             IV fluids   - Impediments to fracture healing:             Open fracture, nicotine use, alcohol use, drug use   - Dispo:             transition to PO abx    Jari Pigg, PA-C [facility] [phone] ([phone] [name] (C) 10/31/2017, 9:23 AM

## 2017-10-31 NOTE — Progress Notes (Signed)
Occupational Therapy Treatment Patient Details [name] MRN: [id number] DOB: 03-Apr-1951 Today's Date: 10/31/2017    History of present illness Glen Colon is an 67 y.o. white male well-known to the orthopedic trauma service for an open right pilon fracture approximately 2 weeks ago.  Patient was treated with external fixation and intramedullary nailing of his fibula due to severe soft tissue swelling and large traumatic wound medially.  Patient discharged from hospital on 10/18/2017.  Presented there was erythema around his wound.  No purulence.  He is admitted for cellulitis treatment with IV antibiotics given the very little margin due to his medical comorbidities.   OT comments  Pt reports pain in the R LE lateral aspect of the calf this session. Pt reports it is improved from the previous day. Pt lacks awareness to the risk to the R LE. Pt again educated on the need to elevate R LE and placed on pillows/ blankets for elevation this session. Concern for R LE heel break down due to patient positioning throughout session. Pt advised to beware of risk to skin.    Follow Up Recommendations  SNF    Equipment Recommendations       Recommendations for Other Services      Precautions / Restrictions Precautions Precautions: Fall Precaution Comments: external fixator R LE  Restrictions RLE Weight Bearing: Non weight bearing       Mobility Bed Mobility Overal bed mobility: Independent             General bed mobility comments: note that patient is not elevating R LE enough. Pt educated on the need to use pillows/ blankets to keep foot above knee.   Transfers Overall transfer level: Needs assistance Equipment used: Rolling walker (2 wheeled) Transfers: Sit to/from Stand Sit to Stand: Min guard         General transfer comment: requires use of RW for reliance     Balance                                           ADL either performed or assessed  with clinical judgement   ADL Overall ADL's : Needs assistance/impaired Eating/Feeding: Independent   Grooming: Oral care;Supervision/safety;Sitting Grooming Details (indicate cue type and reason): shaving face. pt placing tooth brush in container then water then dipping razor in the same container to clean the razor. Pt states "yeah i take care of myself at home so i got to practice that up here too"                 Toilet Transfer: Min Lobbyist Details (indicate cue type and reason): pt transferred onto 3n1 for sink level grooming         Functional mobility during ADLs: Min guard;Rolling walker General ADL Comments: pt initially declines oob stating "oh honey I just ate my breakfast" pt allowed to brush teeth and shave face with new razor. pt states "oh yeah i feel good. that razor works real good" pt states "glad we did that right there" Pt up in chair and states "i have to take another weeks worth of vacation from [place]. i got to get out of here"      Vision       Perception     Praxis      Cognition Arousal/Alertness: Awake/alert Behavior During Therapy: [facility] for tasks assessed/performed  Overall Cognitive Status: Impaired/Different from baseline Area of Impairment: Safety/judgement                         Safety/Judgement: Decreased awareness of safety     General Comments: Pt resting R LE on the floor and lacks awareness to fall risk by abandoning the RW         Exercises     Shoulder Instructions       General Comments pt reports lateral aspect of calf with pain in the night and questions blood clot. OT touching bil le with same temperature no increase edema noted and pt self reports "its better this morning" . Pt states "i dont know last night i thought i had a blood clot but this morning its better"    Pertinent Vitals/ Pain       Pain Assessment: No/denies pain Pain Intervention(s): Other (comment)(dont even hurt this  morning)  Home Living                                          Prior Functioning/Environment              Frequency  Min 2X/week        Progress Toward Goals  OT Goals(current goals can now be found in the care plan section)  Progress towards OT goals: Progressing toward goals  Acute Rehab OT Goals Patient Stated Goal: to return home asap OT Goal Formulation: With patient Time For Goal Achievement: 11/10/17 Potential to Achieve Goals: Good ADL Goals Pt Will Perform Lower Body Bathing: with min assist;with adaptive equipment;with caregiver independent in assisting Pt Will Perform Lower Body Dressing: with min assist;with adaptive equipment;with caregiver independent in assisting Pt Will Transfer to Toilet: with supervision;stand pivot transfer;bedside commode Pt Will Perform Toileting - Clothing Manipulation and hygiene: with max assist;with mod assist;sitting/lateral leans;sit to/from stand;with caregiver independent in assisting Pt Will Perform Tub/Shower Transfer: Tub transfer;3 in 1;rolling walker;with supervision Additional ADL Goal #1: Pt will complete bed mobility supervision level for adls  Plan Discharge plan remains appropriate    Co-evaluation                 AM-PAC PT "6 Clicks" Daily Activity     Outcome Measure   Help from another person eating meals?: None Help from another person taking care of personal grooming?: A Little Help from another person toileting, which includes using toliet, bedpan, or urinal?: A Lot Help from another person bathing (including washing, rinsing, drying)?: A Lot Help from another person to put on and taking off regular upper body clothing?: A Lot Help from another person to put on and taking off regular lower body clothing?: A Lot 6 Click Score: 15    End of Session Equipment Utilized During Treatment: Rolling walker  OT Visit Diagnosis: Unsteadiness on feet (R26.81);Pain;Other abnormalities of  gait and mobility (R26.89) Pain - Right/Left: Right Pain - part of body: Leg   Activity Tolerance Patient tolerated treatment well   Patient Left in bed;with call bell/phone within reach;with family/visitor present   Nurse Communication Mobility status;Precautions;Weight bearing status        Time: 3762-8315 OT Time Calculation (min): 21 min  Charges: OT General Charges $OT Visit: 1 Visit OT Treatments $Self Care/Home Management : 8-22 mins   Jeri Modena   OTR/L Pager: [phone] Office: [phone] .  Parke Poisson B 10/31/2017, 8:34 AM

## 2017-11-01 MED ORDER — DOXYCYCLINE HYCLATE 100 MG PO TABS: 100.0000 mg | ORAL_TABLET | Freq: Two times a day (BID) | ORAL | 0 refills | Status: DC

## 2017-11-01 MED ORDER — LEVOFLOXACIN 500 MG PO TABS: 500.0000 mg | ORAL_TABLET | Freq: Every day | ORAL | 0 refills | Status: DC

## 2017-11-01 NOTE — Discharge Instructions (Signed)
Orthopaedic Trauma Service Discharge Instructions   General Discharge Instructions  WEIGHT BEARING STATUS: Nonweightbearing Right leg   RANGE OF MOTION/ACTIVITY: move toes as much as possible to help with swelling. Keep leg elevate above heart as much as possible as well to help control swelling   Wound Care: see below    Discharge Pin Site Instructions  Dress pins daily with Kerlix roll starting on POD 2. Wrap the Kerlix so that it tamps the skin down around the pin-skin interface to prevent/limit motion of the skin relative to the pin.  (Pin-skin motion is the primary cause of pain and infection related to external fixator pin sites).  Remove any crust or coagulum that may obstruct drainage with a saline moistened gauze or soap and water.  After POD 3, if there is no discernable drainage on the pin site dressing, the interval for change can by increased to every other day.  You may shower with the fixator, cleaning all pin sites gently with soap and water.  If you have a surgical wound this needs to be completely dry and without drainage before showering.  The extremity can be lifted by the fixator to facilitate wound care and transfers.  Notify the office/Doctor if you experience increasing drainage, redness, or pain from a pin site, or if you notice purulent (thick, snot-like) drainage.  Discharge Wound Care Instructions  Do NOT apply any ointments, solutions or lotions to pin sites or surgical wounds.  These prevent needed drainage and even though solutions like hydrogen peroxide kill bacteria, they also damage cells lining the pin sites that help fight infection.  Applying lotions or ointments can keep the wounds moist and can cause them to breakdown and open up as well. This can increase the risk for infection. When in doubt call the office.  Surgical incisions should be dressed daily.  If any drainage is noted, use one layer of adaptic, then gauze, Kerlix, and an ace  wrap.  Once the incision is completely dry and without drainage, it may be left open to air out.  Showering may begin 36-48 hours later.  Cleaning gently with soap and water.  Traumatic wounds should be dressed daily as well.    One layer of adaptic, gauze, Kerlix, then ace wrap.  The adaptic can be discontinued once the draining has ceased    If you have a wet to dry dressing: wet the gauze with saline the squeeze as much saline out so the gauze is moist (not soaking wet), place moistened gauze over wound, then place a dry gauze over the moist one, followed by Kerlix wrap, then ace wrap.   Diet: as you were eating previously.  Can use over the counter stool softeners and bowel preparations, such as Miralax, to help with bowel movements.  Narcotics can be constipating.  Be sure to drink plenty of fluids  PAIN MEDICATION USE AND EXPECTATIONS  You have likely been given narcotic medications to help control your pain.  After a traumatic event that results in an fracture (broken bone) with or without surgery, it is ok to use narcotic pain medications to help control one's pain.  We understand that everyone responds to pain differently and each individual patient will be evaluated on a regular basis for the continued need for narcotic medications. Ideally, narcotic medication use should last no more than 6-8 weeks (coinciding with fracture healing).   As a patient it is your responsibility as well to monitor narcotic medication use and report the  amount and frequency you use these medications when you come to your office visit.   We would also advise that if you are using narcotic medications, you should take a dose prior to therapy to maximize you participation.  IF YOU ARE ON NARCOTIC MEDICATIONS IT IS NOT PERMISSIBLE TO OPERATE A MOTOR VEHICLE (MOTORCYCLE/CAR/TRUCK/MOPED) OR HEAVY MACHINERY DO NOT MIX NARCOTICS WITH OTHER CNS (CENTRAL NERVOUS SYSTEM) DEPRESSANTS SUCH AS ALCOHOL   STOP SMOKING OR  USING NICOTINE PRODUCTS!!!!  As discussed nicotine severely impairs your body's ability to heal surgical and traumatic wounds but also impairs bone healing.  Wounds and bone heal by forming microscopic blood vessels (angiogenesis) and nicotine is a vasoconstrictor (essentially, shrinks blood vessels).  Therefore, if vasoconstriction occurs to these microscopic blood vessels they essentially disappear and are unable to deliver necessary nutrients to the healing tissue.  This is one modifiable factor that you can do to dramatically increase your chances of healing your injury.    (This means no smoking, no nicotine gum, patches, etc)  DO NOT USE NONSTEROIDAL ANTI-INFLAMMATORY DRUGS (NSAID'S)  Using products such as Advil (ibuprofen), Aleve (naproxen), Motrin (ibuprofen) for additional pain control during fracture healing can delay and/or prevent the healing response.  If you would like to take over the counter (OTC) medication, Tylenol (acetaminophen) is ok.  However, some narcotic medications that are given for pain control contain acetaminophen as well. Therefore, you should not exceed more than 4000 mg of tylenol in a day if you do not have liver disease.  Also note that there are may OTC medicines, such as cold medicines and allergy medicines that my contain tylenol as well.  If you have any questions about medications and/or interactions please ask your doctor/PA or your pharmacist.      ICE AND ELEVATE INJURED/OPERATIVE EXTREMITY  Using ice and elevating the injured extremity above your heart can help with swelling and pain control.  Icing in a pulsatile fashion, such as 20 minutes on and 20 minutes off, can be followed.    Do not place ice directly on skin. Make sure there is a barrier between to skin and the ice pack.    Using frozen items such as frozen peas works well as the conform nicely to the are that needs to be iced.  USE AN ACE WRAP OR TED HOSE FOR SWELLING CONTROL  In addition to icing  and elevation, Ace wraps or TED hose are used to help limit and resolve swelling.  It is recommended to use Ace wraps or TED hose until you are informed to stop.    When using Ace Wraps start the wrapping distally (farthest away from the body) and wrap proximally (closer to the body)   Example: If you had surgery on your leg or thing and you do not have a splint on, start the ace wrap at the toes and work your way up to the thigh        If you had surgery on your upper extremity and do not have a splint on, start the ace wrap at your fingers and work your way up to the upper arm  IF YOU ARE IN A SPLINT OR CAST DO NOT [place]   If your splint gets wet for any reason please contact the office immediately. You may shower in your splint or cast as long as you keep it dry.  This can be done by wrapping in a cast cover or garbage back (or  similar)  Do Not stick any thing down your splint or cast such as pencils, money, or hangers to try and scratch yourself with.  If you feel itchy take benadryl as prescribed on the bottle for itching  IF YOU ARE IN A CAM BOOT (BLACK BOOT)  You may remove boot periodically. Perform daily dressing changes as noted below.  Wash the liner of the boot regularly and wear a sock when wearing the boot. It is recommended that you sleep in the boot until told otherwise  CALL THE OFFICE WITH ANY QUESTIONS OR CONCERNS: [phone]

## 2017-11-01 NOTE — Progress Notes (Signed)
Physical Therapy Treatment Patient Details [name] MRN: [id number] DOB: Jan 27, 1951 Today's Date: 11/01/2017    History of Present Illness Glen Colon is an 67 y.o. white male well-known to the orthopedic trauma service for an open right pilon fracture approximately 2 weeks ago.  Patient was treated with external fixation and intramedullary nailing of his fibula due to severe soft tissue swelling and large traumatic wound medially.  Patient discharged from hospital on 10/18/2017.  Presented there was erythema around his wound.  No purulence.  He is admitted for cellulitis treatment with IV antibiotics given the very little margin due to his medical comorbidities.    PT Comments    Patient agreeable to participate however fixated on desire to d/c home today. Pt educated again on importance of complying with NWB status and keeping R LE elevated. Pt tolerated short distance gait and will need supervision/assistance for safe OOB mobility while adhering to precautions.  Continue to progress as tolerated.   Follow Up Recommendations  SNF;Supervision for mobility/OOB     Equipment Recommendations  None recommended by PT    Recommendations for Other Services       Precautions / Restrictions Precautions Precautions: Fall Precaution Comments: external fixator R LE  Restrictions Weight Bearing Restrictions: Yes RLE Weight Bearing: Non weight bearing    Mobility  Bed Mobility Overal bed mobility: Independent                Transfers Overall transfer level: Needs assistance Equipment used: Rolling walker (2 wheeled) Transfers: Sit to/from Stand Sit to Stand: Min guard         General transfer comment: min guard for safety and cues for general safety with use of AD and being close enough to surface before sitting  Ambulation/Gait Ambulation/Gait assistance: Min guard   Assistive device: Rolling walker (2 wheeled) Gait Pattern/deviations: Step-to pattern Gait  velocity: decreased   General Gait Details: cues for sequencing, proximity to RW, maintaining NWB R LE as pt tends to rest ext fix on the ground in standing, and to slow down and take time for safer movements   Stairs            Wheelchair Mobility    Modified Rankin (Stroke Patients Only)       Balance Overall balance assessment: Needs assistance Sitting-balance support: No upper extremity supported Sitting balance-Leahy Scale: Good     Standing balance support: Bilateral upper extremity supported;During functional activity Standing balance-Leahy Scale: Poor Standing balance comment: reliant on RW                            Cognition Arousal/Alertness: Awake/alert Behavior During Therapy: WFL for tasks assessed/performed Overall Cognitive Status: Within Functional Limits for tasks assessed Area of Impairment: Safety/judgement                         Safety/Judgement: Decreased awareness of safety;Decreased awareness of deficits     General Comments: pt demonstrates poor safety awareness at baseline      Exercises      General Comments General comments (skin integrity, edema, etc.): pt educated again on importance of maintaining R LE elevated when at rest to aid in healing process      Pertinent Vitals/Pain Pain Assessment: Faces Faces Pain Scale: Hurts a little bit Pain Location: bilat UE while ambulating  Pain Descriptors / Indicators: Sore Pain Intervention(s): Limited activity within patient's tolerance;Monitored  during session;Repositioned    Home Living                      Prior Function            PT Goals (current goals can now be found in the care plan section) Acute Rehab PT Goals Patient Stated Goal: to return home asap PT Goal Formulation: With patient Time For Goal Achievement: 10/28/17 Potential to Achieve Goals: Good Progress towards PT goals: Progressing toward goals    Frequency    Min  4X/week      PT Plan Current plan remains appropriate    Co-evaluation              AM-PAC PT "6 Clicks" Daily Activity  Outcome Measure  Difficulty turning over in bed (including adjusting bedclothes, sheets and blankets)?: A Little Difficulty moving from lying on back to sitting on the side of the bed? : A Little Difficulty sitting down on and standing up from a chair with arms (e.g., wheelchair, bedside commode, etc,.)?: A Little Help needed moving to and from a bed to chair (including a wheelchair)?: A Little Help needed walking in hospital room?: A Little Help needed climbing 3-5 steps with a railing? : A Lot 6 Click Score: 17    End of Session Equipment Utilized During Treatment: Gait belt Activity Tolerance: Patient limited by fatigue Patient left: with call bell/phone within reach;in bed;with family/visitor present Nurse Communication: Mobility status PT Visit Diagnosis: Unsteadiness on feet (R26.81);Muscle weakness (generalized) (M62.81);History of falling (Z91.81);Difficulty in walking, not elsewhere classified (R26.2) Pain - Right/Left: Right Pain - part of body: Leg     Time: 1430-1445 PT Time Calculation (min) (ACUTE ONLY): 15 min  Charges:  $Gait Training: 8-22 mins                    G Codes:       Earney Navy, PTA Pager: [phone]     Darliss Cheney 11/01/2017, 3:38 PM

## 2017-11-01 NOTE — Care Management (Signed)
Patient will discharge home on Oral antibiotics. Has medicaid and can get them at [facility] No Home Health needs at this time.

## 2017-11-01 NOTE — Progress Notes (Signed)
Orthopedic Trauma Service Progress Note   Patient ID: [name] MRN: [id number] DOB/AGE: 1950/10/18 67 y.o.  Subjective:  No complaints  Dressed and ready to go home  Seemed shocked when I told him I know he has been smoking at home   States he will not smoke, drink or do drugs when he goes home   Afebrile since transition to PO abx   ROS As above    Objective:   VITALS:   Vitals:   10/31/17 1300 10/31/17 1955 11/01/17 0441 11/01/17 1300  BP: (!) 175/54 (!) 119/58 (!) 112/57 133/74  Pulse: 73 76 76 86  Resp: 16 16 16 18   Temp: 97.6 F (36.4 C) 98.3 F (36.8 C) (!) 97.5 F (36.4 C) 98.2 F (36.8 C)  TempSrc: Oral Oral Oral Oral  SpO2: 95% 96% 95% 99%    Estimated body mass index is 29.41 kg/m as calculated from the following:   Height as of 10/11/17: 5\' 10"  (1.778 m).   Weight as of 10/11/17: 93 kg (205 lb).   Intake/Output      02/04 0701 - 02/05 0700 02/05 0701 - 02/06 0700   P.O. 1180 960   Total Intake 1180 960   Urine 4000 1800   Stool  0   Total Output 4000 1800   Net -2820 -840        Urine Occurrence  1 x   Stool Occurrence  1 x     LABS  No results found for this or any previous visit (from the past 24 hour(s)).   PHYSICAL EXAM:   Gen: awake and alert, resting comfortably in bed, NAD  Ext:        Right Lower extremity  Ex fix stable  pinsites look good  Ext warm  + foot swelling  Traumatic wound medial ankle is stable  Ecchymosis and erythema diminished  No purulence   Eschar well fixed   Distal motor and sensory functions intact    Assessment/Plan:     Principal Problem:   Cellulitis of right ankle Active Problems:   Tobacco abuse   Open pilon fracture of tibia   Marijuana abuse   Alcohol dependence (HCC)   Cirrhosis (HCC)   Hepatitis C, chronic (HCC)   Vitamin D deficiency   Hypogonadism in male   Anti-infectives (From admission, onward)   Start     Dose/Rate Route Frequency Ordered  Stop   10/31/17 1100  levofloxacin (LEVAQUIN) tablet 500 mg     500 mg Oral Daily 10/31/17 1005     10/31/17 1030  doxycycline (VIBRA-TABS) tablet 100 mg     100 mg Oral Every 12 hours 10/31/17 1005     10/26/17 1930  piperacillin-tazobactam (ZOSYN) IVPB 3.375 g  Status:  Discontinued     3.375 g 12.5 mL/hr over 240 Minutes Intravenous Every 8 hours 10/26/17 1256 10/31/17 1005   10/26/17 1300  piperacillin-tazobactam (ZOSYN) IVPB 3.375 g     3.375 g 100 mL/hr over 30 Minutes Intravenous  Once 10/26/17 1256 10/26/17 1431     POD/HD#: 41  67 year old male approximately 2 weeks post open right tibial plafond fracture treated with external fixation and intramedullary nailing of his right fibula admitted for cellulitis right ankle   -Cellulitis right ankle, s/p open R pilon fracture, tibia and fibula. Ex fix tibia and IMN fibula             Cellulitis stable  Continue PO abx   Levaquin and doxycycline  Pt can get at [facility] for $4               NWB R leg             Therapies as tolerated             OOB ad lib             Ice and elevation               dressing changed today, change dressing 11/03/2017               Ok to shower: remove all dressings and wash leg gently with soap and water. Redress leg with aquacel ag dressing over traumatic wound, medial ankle. kerlix and ace from foot to mid lower leg and kerlix around pinsites               Pt refusing SNF             Still believe there is a high probability of complications that could lead to limb loss. In spite of reviewing this with the pt, he still wishes to go home                Labs confirm that pt has been smoking PTA. His nicotine level is undetected which is appropriate as he has been in the hospital for 5 days and has not had any nicotine/nicotine products. Cotinine levels are positive indicating nicotine use in last 10 days or so    - Pain management:             Percocet and OxyIR- pt still has rxs from last  admission. No new opioid rxs today    - ABL anemia/Hemodynamics             Stable   - Medical issues              Nicotine dependence, alcohol abuse, hepatitis C               Cxr- ATX   - DVT/PE prophylaxis:             Lovenox - ID:              doxycycline and levaquin x 30 days    - FEN/GI prophylaxis/Foley/Lines:             Regular diet   - Impediments to fracture healing:             Open fracture, nicotine use, alcohol use, drug use   - Dispo:           dc home today  Discussed our concerns with dc home  Still concerned that pt will be noncompliant and resume smoking and drinking. We discussed he has essentially no margin for any progression of infection.  Should this occur he will end up with BKA    Follow up in 1 week   Jari Pigg, PA-C [facility] [phone] Glen Colon (C) 11/01/2017, 3:22 PM

## 2017-11-01 NOTE — Discharge Summary (Signed)
Please see actual discharge summary for same dates and delete this one. Thx MH

## 2017-11-01 NOTE — Progress Notes (Signed)
Patient has been given discharge instructions with follow up.  Verbalizes understanding discharge instructions.  Patient is discharged via wheel chair.

## 2017-11-08 NOTE — Discharge Summary (Signed)
[facility]edic [facility] (OTS)  Patient ID: Glen Colon MRN: [id number] DOB/AGE: 65/29/1952 65 y.o.  Admit date: 10/26/2017 Discharge date: 11/01/2017   Admission Diagnoses: cellulits R ankle Open R pilon fracture s/p ex fix  Nicotine dependence Marijuana abuse EtOH dependence  Hep c  Cirrhosis  Vit d deficiency  Hypogonadism   Discharge Diagnoses:  Principal Problem:   Cellulitis of right ankle Active Problems:   Tobacco abuse   Open pilon fracture of tibia   Marijuana abuse   Alcohol dependence (HCC)   Cirrhosis (HCC)   Hepatitis C, chronic (HCC)   Vitamin D deficiency   Hypogonadism in male   Past Medical History:  Diagnosis Date  . Alcohol dependence ([place]) 10/13/2017  . Anemia   . Angina at rest [facility])    "take an aspirin & it goes right away" (10/26/2017)  . Anxiety   . Arthritis    "joints hurt; especially on cold days" (10/26/2017)  . Chronic bronchitis ([place])   . Cirrhosis ([place]) 10/13/2017  . COPD (chronic obstructive pulmonary disease) ([place])   . Epilepsy ([place]) 2018   "no sz if I take RX q night at bedtime" (10/26/2017)  . Hepatitis C, chronic ([place])    "tried tx in 2018; the RX gave me seizures; couldn't walk for 3 days; never finished the the tx" (10/26/2017)  . History of hiatal hernia   . Hypogonadism in male 10/18/2017  . Marijuana abuse 10/13/2017  . Pneumonia 1995  . Seizure ([place]) 2018   "related to hepatitis RX" (10/26/2017)  . Tobacco abuse   . Vitamin D deficiency 10/18/2017     Procedures Performed:    no operative procedures this admission   Discharged Condition: stable  Hospital Course:   66 year old male taken to the operating room on 10/13/2017 by the orthopedic trauma service for open right tibial plafond fracture, tibia and fibula.  Patient eventually discharged outpatient follow-up completed.  On follow-up in the office patient's wound is somewhat questionable with significant maceration significant cellulitis.  Patient directly  admitted from the office to the hospital for IV antibiotics.  Patient was monitored closely in the hospital.  He did respond to Zosyn and vancomycin.  We did perform daily wound and pin care for the patient.  He was eventually transition to oral antibiotics including Levaquin and doxycycline.  We did try to convince the patient to go to a rehab center this time however he continued to refuse due to concerns about giving up his Medicaid check to pay for nursing facility.  Patient was eventually discharged home on 11/01/2017.  Again we discussed in great detail that it he continues to smoke and drink he will likely presented back to the hospital for amputation of his right leg.  Patient acknowledged the risks and requested discharge to home.  Consults: None  Significant Diagnostic Studies: labs:  Results for Glen Colon (MRN [id number]) as of 11/08/2017 15:19  Ref. Range 10/26/2017 19:30 10/28/2017 12:28 10/29/2017 04:23  Sodium Latest Ref Range: 135 - 145 mmol/L   137  Potassium Latest Ref Range: 3.5 - 5.1 mmol/L   3.9  Chloride Latest Ref Range: 101 - 111 mmol/L   105  CO2 Latest Ref Range: 22 - 32 mmol/L   27  Glucose Latest Ref Range: 65 - 99 mg/dL   78  BUN Latest Ref Range: 6 - 20 mg/dL   7  Creatinine Latest Ref Range: 0.61 - 1.24 mg/dL   0.76  Calcium Latest Ref Range: 8.9 -  10.3 mg/dL   8.3 (L)  Anion gap Latest Ref Range: 5 - 15    5  Alkaline Phosphatase Latest Ref Range: 38 - 126 U/L   209 (H)  Albumin Latest Ref Range: 3.5 - 5.0 g/dL   2.1 (L)  AST Latest Ref Range: 15 - 41 U/L   51 (H)  ALT Latest Ref Range: 17 - 63 U/L   28  Total Protein Latest Ref Range: 6.5 - 8.1 g/dL   5.5 (L)  Total Bilirubin Latest Ref Range: 0.3 - 1.2 mg/dL   1.3 (H)  GFR, Est Non African American Latest Ref Range: >60 mL/min   >60  GFR, Est African American Latest Ref Range: >60 mL/min   >60  CRP Latest Ref Range: <1.0 mg/dL <0.8    WBC Latest Ref Range: 4.0 - 10.5 K/uL   4.0  RBC Latest Ref Range: 4.22 -  5.81 MIL/uL   3.25 (L)  Hemoglobin Latest Ref Range: 13.0 - 17.0 g/dL   11.4 (L)  HCT Latest Ref Range: 39.0 - 52.0 %   34.3 (L)  MCV Latest Ref Range: 78.0 - 100.0 fL   105.5 (H)  MCH Latest Ref Range: 26.0 - 34.0 pg   35.1 (H)  MCHC Latest Ref Range: 30.0 - 36.0 g/dL   33.2  RDW Latest Ref Range: 11.5 - 15.5 %   14.7  Platelets Latest Ref Range: 150 - 400 K/uL   80 (L)  Neutrophils Latest Units: %   54  Lymphocytes Latest Units: %   34  Monocytes Relative Latest Units: %   10  Eosinophil Latest Units: %   2  Basophil Latest Units: %   0  NEUT# Latest Ref Range: 1.7 - 7.7 K/uL   2.2  Lymphocyte # Latest Ref Range: 0.7 - 4.0 K/uL   1.4  Monocyte # Latest Ref Range: 0.1 - 1.0 K/uL   0.4  Eosinophils Absolute Latest Ref Range: 0.0 - 0.7 K/uL   0.1  Basophils Absolute Latest Ref Range: 0.0 - 0.1 K/uL   0.0  Sed Rate Latest Ref Range: 0 - 16 mm/hr 23 (H)    Results for Glen Colon (MRN [id number]) as of 11/08/2017 15:19  Ref. Range 10/26/2017 13:59  Nicotine Latest Units: ng/mL None Detected  Cotinine Latest Units: ng/mL 25.2  Alcohol, Ethyl (B) Latest Ref Range: <10 mg/dL <10  Results for Glen Colon (MRN [id number]) as of 11/08/2017 15:19  Ref. Range 10/26/2017 14:55  Amphetamines Latest Ref Range: NONE DETECTED  NONE DETECTED  Barbiturates Latest Ref Range: NONE DETECTED  POSITIVE (A)  Benzodiazepines Latest Ref Range: NONE DETECTED  POSITIVE (A)  Opiates Latest Ref Range: NONE DETECTED  NONE DETECTED  COCAINE Latest Ref Range: NONE DETECTED  NONE DETECTED  Tetrahydrocannabinol Latest Ref Range: NONE DETECTED  NONE DETECTED     Treatments: IV hydration, antibiotics: vancomycin and Zosyn, analgesia: percocet and oxy IR, anticoagulation: LMW heparin and therapies: PT, OT, RN and SW  Discharge Exam:     Orthopedic Trauma Service Progress Note    Patient ID: Glen Colon MRN: [id number] DOB/AGE: 01/09/1951 67 y.o.   Subjective:   No complaints   Dressed and ready to go  home   Seemed shocked when I told him I know he has been smoking at home    States he will not smoke, drink or do drugs when he goes home    Afebrile since transition to PO abx  ROS As above      Objective:    VITALS:         Vitals:    10/31/17 1300 10/31/17 1955 11/01/17 0441 11/01/17 1300  BP: (!) 175/54 (!) 119/58 (!) 112/57 133/74  Pulse: 73 76 76 86  Resp: 16 16 16 18   Temp: 97.6 F (36.4 C) 98.3 F (36.8 C) (!) 97.5 F (36.4 C) 98.2 F (36.8 C)  TempSrc: Oral Oral Oral Oral  SpO2: 95% 96% 95% 99%      Estimated body mass index is 29.41 kg/m as calculated from the following:   Height as of 10/11/17: 5\' 10"  (1.778 m).   Weight as of 10/11/17: 93 kg (205 lb).     Intake/Output      02/04 0701 - 02/05 0700 02/05 0701 - 02/06 0700   P.O. 1180 960   Total Intake 1180 960   Urine 4000 1800   Stool  0   Total Output 4000 1800   Net -2820 -840        Urine Occurrence  1 x   Stool Occurrence  1 x      LABS   Lab Results Last 24 Hours  No results found for this or any previous visit (from the past 24 hour(s)).       PHYSICAL EXAM:    Gen: awake and alert, resting comfortably in bed, NAD  Ext:        Right Lower extremity             Ex fix stable             pinsites look good             Ext warm             + foot swelling             Traumatic wound medial ankle is stable             Ecchymosis and erythema diminished             No purulence              Eschar well fixed              Distal motor and sensory functions intact                Assessment/Plan:      Principal Problem:   Cellulitis of right ankle Active Problems:   Tobacco abuse   Open pilon fracture of tibia   Marijuana abuse   Alcohol dependence (HCC)   Cirrhosis (HCC)   Hepatitis C, chronic (HCC)   Vitamin D deficiency   Hypogonadism in male               Anti-infectives (From admission, onward)    Start     Dose/Rate Route Frequency Ordered Stop    10/31/17  1100   levofloxacin (LEVAQUIN) tablet 500 mg     500 mg Oral Daily 10/31/17 1005      10/31/17 1030   doxycycline (VIBRA-TABS) tablet 100 mg     100 mg Oral Every 12 hours 10/31/17 1005      10/26/17 1930   piperacillin-tazobactam (ZOSYN) IVPB 3.375 g  Status:  Discontinued     3.375 g 12.5 mL/hr over 240 Minutes Intravenous Every 8 hours 10/26/17 1256 10/31/17 1005    10/26/17 1300   piperacillin-tazobactam (ZOSYN) IVPB 3.375 g     3.375  g 100 mL/hr over 30 Minutes Intravenous  Once 10/26/17 1256 10/26/17 1431       POD/HD#: 64   67 year old male approximately 2 weeks post open right tibial plafond fracture treated with external fixation and intramedullary nailing of his right fibula admitted for cellulitis right ankle   -Cellulitis right ankle, s/p open R pilon fracture, tibia and fibula. Ex fix tibia and IMN fibula             Cellulitis stable             Continue PO abx                         Levaquin and doxycycline                         Pt can get at [facility] for $4               NWB R leg             Therapies as tolerated             OOB ad lib             Ice and elevation               dressing changed today, change dressing 11/03/2017               Ok to shower: remove all dressings and wash leg gently with soap and water. Redress leg with aquacel ag dressing over traumatic wound, medial ankle. kerlix and ace from foot to mid lower leg and kerlix around pinsites               Pt refusing SNF             Still believe there is a high probability of complications that could lead to limb loss. In spite of reviewing this with the pt, he still wishes to go home                Labs confirm that pt has been smoking PTA. His nicotine level is undetected which is appropriate as he has been in the hospital for 5 days and has not had any nicotine/nicotine products. Cotinine levels are positive indicating nicotine use in last 10 days or so    - Pain management:             Percocet  and OxyIR- pt still has rxs from last admission. No new opioid rxs today    - ABL anemia/Hemodynamics             Stable   - Medical issues              Nicotine dependence, alcohol abuse, hepatitis C               Cxr- ATX   - DVT/PE prophylaxis:             Lovenox - ID:              doxycycline and levaquin x 30 days    - FEN/GI prophylaxis/Foley/Lines:             Regular diet   - Impediments to fracture healing:             Open fracture, nicotine use, alcohol use, drug use   - Dispo:  dc home today             Discussed our concerns with dc home             Still concerned that pt will be noncompliant and resume smoking and drinking. We discussed he has essentially no margin for any progression of infection.  Should this occur he will end up with BKA                Follow up in 1 week      Disposition: 01-Home or Self Care  Discharge Instructions    Call MD / Call 911   Complete by:  As directed    If you experience chest pain or shortness of breath, CALL 911 and be transported to the hospital emergency room.  If you develope a fever above 101 F, pus (white drainage) or increased drainage or redness at the wound, or calf pain, call your surgeon's office.   Constipation Prevention   Complete by:  As directed    Drink plenty of fluids.  Prune juice may be helpful.  You may use a stool softener, such as Colace (over the counter) 100 mg twice a day.  Use MiraLax (over the counter) for constipation as needed.   Diet - low sodium heart healthy   Complete by:  As directed    Discharge instructions   Complete by:  As directed    Orthopaedic Trauma Service Discharge Instructions   General Discharge Instructions  WEIGHT BEARING STATUS: Nonweightbearing Right leg   RANGE OF MOTION/ACTIVITY: move toes as much as possible to help with swelling. Keep leg elevate above heart as much as possible as well to help control swelling   Wound Care: see  below    Discharge Pin Site Instructions  Dress pins daily with Kerlix roll starting on POD 2. Wrap the Kerlix so that it tamps the skin down around the pin-skin interface to prevent/limit motion of the skin relative to the pin.  (Pin-skin motion is the primary cause of pain and infection related to external fixator pin sites).  Remove any crust or coagulum that may obstruct drainage with a saline moistened gauze or soap and water.  After POD 3, if there is no discernable drainage on the pin site dressing, the interval for change can by increased to every other day.  You may shower with the fixator, cleaning all pin sites gently with soap and water.  If you have a surgical wound this needs to be completely dry and without drainage before showering.  The extremity can be lifted by the fixator to facilitate wound care and transfers.  Notify the office/Doctor if you experience increasing drainage, redness, or pain from a pin site, or if you notice purulent (thick, snot-like) drainage.  Discharge Wound Care Instructions  Do NOT apply any ointments, solutions or lotions to pin sites or surgical wounds.  These prevent needed drainage and even though solutions like hydrogen peroxide kill bacteria, they also damage cells lining the pin sites that help fight infection.  Applying lotions or ointments can keep the wounds moist and can cause them to breakdown and open up as well. This can increase the risk for infection. When in doubt call the office.  Surgical incisions should be dressed daily.  If any drainage is noted, use one layer of adaptic, then gauze, Kerlix, and an ace wrap.  Once the incision is completely dry and without drainage, it may be left open to air out.  Showering may begin 36-48 hours later.  Cleaning gently with soap and water.  Traumatic wounds should be dressed daily as well.    One layer of adaptic, gauze, Kerlix, then ace wrap.  The adaptic can be discontinued once the  draining has ceased    If you have a wet to dry dressing: wet the gauze with saline the squeeze as much saline out so the gauze is moist (not soaking wet), place moistened gauze over wound, then place a dry gauze over the moist one, followed by Kerlix wrap, then ace wrap.   Diet: as you were eating previously.  Can use over the counter stool softeners and bowel preparations, such as Miralax, to help with bowel movements.  Narcotics can be constipating.  Be sure to drink plenty of fluids  PAIN MEDICATION USE AND EXPECTATIONS  You have likely been given narcotic medications to help control your pain.  After a traumatic event that results in an fracture (broken bone) with or without surgery, it is ok to use narcotic pain medications to help control one's pain.  We understand that everyone responds to pain differently and each individual patient will be evaluated on a regular basis for the continued need for narcotic medications. Ideally, narcotic medication use should last no more than 6-8 weeks (coinciding with fracture healing).   As a patient it is your responsibility as well to monitor narcotic medication use and report the amount and frequency you use these medications when you come to your office visit.   We would also advise that if you are using narcotic medications, you should take a dose prior to therapy to maximize you participation.  IF YOU ARE ON NARCOTIC MEDICATIONS IT IS NOT PERMISSIBLE TO OPERATE A MOTOR VEHICLE (MOTORCYCLE/CAR/TRUCK/MOPED) OR HEAVY MACHINERY DO NOT MIX NARCOTICS WITH OTHER CNS (CENTRAL NERVOUS SYSTEM) DEPRESSANTS SUCH AS ALCOHOL   STOP SMOKING OR USING NICOTINE PRODUCTS!!!!  As discussed nicotine severely impairs your body's ability to heal surgical and traumatic wounds but also impairs bone healing.  Wounds and bone heal by forming microscopic blood vessels (angiogenesis) and nicotine is a vasoconstrictor (essentially, shrinks blood vessels).  Therefore, if  vasoconstriction occurs to these microscopic blood vessels they essentially disappear and are unable to deliver necessary nutrients to the healing tissue.  This is one modifiable factor that you can do to dramatically increase your chances of healing your injury.    (This means no smoking, no nicotine gum, patches, etc)  DO NOT USE NONSTEROIDAL ANTI-INFLAMMATORY DRUGS (NSAID'S)  Using products such as Advil (ibuprofen), Aleve (naproxen), Motrin (ibuprofen) for additional pain control during fracture healing can delay and/or prevent the healing response.  If you would like to take over the counter (OTC) medication, Tylenol (acetaminophen) is ok.  However, some narcotic medications that are given for pain control contain acetaminophen as well. Therefore, you should not exceed more than 4000 mg of tylenol in a day if you do not have liver disease.  Also note that there are may OTC medicines, such as cold medicines and allergy medicines that my contain tylenol as well.  If you have any questions about medications and/or interactions please ask your doctor/PA or your pharmacist.      ICE AND ELEVATE INJURED/OPERATIVE EXTREMITY  Using ice and elevating the injured extremity above your heart can help with swelling and pain control.  Icing in a pulsatile fashion, such as 20 minutes on and 20 minutes off, can be followed.    Do not place ice  directly on skin. Make sure there is a barrier between to skin and the ice pack.    Using frozen items such as frozen peas works well as the conform nicely to the are that needs to be iced.  USE AN ACE WRAP OR TED HOSE FOR SWELLING CONTROL  In addition to icing and elevation, Ace wraps or TED hose are used to help limit and resolve swelling.  It is recommended to use Ace wraps or TED hose until you are informed to stop.    When using Ace Wraps start the wrapping distally (farthest away from the body) and wrap proximally (closer to the body)   Example: If you had surgery on  your leg or thing and you do not have a splint on, start the ace wrap at the toes and work your way up to the thigh        If you had surgery on your upper extremity and do not have a splint on, start the ace wrap at your fingers and work your way up to the upper arm  IF YOU ARE IN A SPLINT OR CAST DO NOT [place]   If your splint gets wet for any reason please contact the office immediately. You may shower in your splint or cast as long as you keep it dry.  This can be done by wrapping in a cast cover or garbage back (or similar)  Do Not stick any thing down your splint or cast such as pencils, money, or hangers to try and scratch yourself with.  If you feel itchy take benadryl as prescribed on the bottle for itching  IF YOU ARE IN A CAM BOOT (BLACK BOOT)  You may remove boot periodically. Perform daily dressing changes as noted below.  Wash the liner of the boot regularly and wear a sock when wearing the boot. It is recommended that you sleep in the boot until told otherwise  CALL THE OFFICE WITH ANY QUESTIONS OR CONCERNS: [phone]   Driving restrictions   Complete by:  As directed    No driving   Increase activity slowly as tolerated   Complete by:  As directed    Non weight bearing   Complete by:  As directed    Laterality:  right   Extremity:  Lower     Allergies as of 11/01/2017      Reactions   Codeine Nausea And Vomiting, Other (See Comments)   CHILLS & SWEATS "DEATHLY ILL" per Pt description      Medication List    TAKE these medications   acetaminophen 650 MG suppository Commonly known as:  TYLENOL Place 1 suppository (650 mg total) rectally every 6 (six) hours as needed for mild pain (or Fever >/= 101).   alprazolam 2 MG tablet Commonly known as:  XANAX Take 1 mg by mouth 2 (two) times daily.   ascorbic acid 500 MG tablet Commonly known as:  VITAMIN C Take 1 tablet (500 mg total) by mouth daily.   bisacodyl 10 MG suppository Commonly known as:   DULCOLAX Place 1 suppository (10 mg total) rectally daily as needed for moderate constipation.   calcium citrate 950 MG tablet Commonly known as:  CALCITRATE - dosed in mg elemental calcium Take 1 tablet (200 mg of elemental calcium total) by mouth 2 (two) times daily.   Cholecalciferol 50000 units Tabs Take 5,000 Units by mouth daily.   docusate sodium 100 MG capsule Commonly known as:  COLACE  Take 1 capsule (100 mg total) by mouth 2 (two) times daily.   doxycycline 100 MG tablet Commonly known as:  VIBRA-TABS Take 1 tablet (100 mg total) by mouth every 12 (twelve) hours.   folic acid 1 MG tablet Commonly known as:  FOLVITE Take 1 tablet (1 mg total) by mouth daily.   levofloxacin 500 MG tablet Commonly known as:  LEVAQUIN Take 1 tablet (500 mg total) by mouth daily.   methocarbamol 500 MG tablet Commonly known as:  ROBAXIN Take 1-2 tablets (500-1,000 mg total) by mouth every 6 (six) hours as needed for muscle spasms.   multivitamin with minerals Tabs tablet Take 1 tablet by mouth daily.   oxyCODONE 5 MG immediate release tablet Commonly known as:  Oxy IR/ROXICODONE Take 1-2 tablets (5-10 mg total) by mouth every 6 (six) hours as needed for severe pain.   PHENObarbital 100 MG tablet Commonly known as:  LUMINAL Take 100 mg by mouth at bedtime.   polyethylene glycol packet Commonly known as:  MIRALAX / GLYCOLAX Take 17 g by mouth daily. What changed:    how much to take  when to take this  reasons to take this            Discharge Care Instructions  (From admission, onward)        Start     Ordered   11/01/17 0000  Non weight bearing    Question Answer Comment  Laterality right   Extremity Lower      11/01/17 1544     Follow-up Information    Altamese Elko, MD. Schedule an appointment as soon as possible for a visit on 11/09/2017.   Specialty:  Orthopedic Surgery Contact information: [place] [address] [place]  [id number] [phone]           Discharge Instructions and Plan:  67 year old male approximately 2 weeks post open right tibial plafond fracture treated with external fixation and intramedullary nailing of his right fibula admitted for cellulitis right ankle   -Cellulitis right ankle, s/p open R pilon fracture, tibia and fibula. Ex fix tibia and IMN fibula             Cellulitis stable             Continue PO abx                         Levaquin and doxycycline                         Pt can get at [facility] for $4               NWB R leg             Therapies as tolerated             OOB ad lib             Ice and elevation               dressing changed today, change dressing 11/03/2017               Ok to shower: remove all dressings and wash leg gently with soap and water. Redress leg with aquacel ag dressing over traumatic wound, medial ankle. kerlix and ace from foot to mid lower leg and kerlix around pinsites  Pt refusing SNF             Still believe there is a high probability of complications that could lead to limb loss. In spite of reviewing this with the pt, he still wishes to go home                Labs confirm that pt has been smoking PTA. His nicotine level is undetected which is appropriate as he has been in the hospital for 5 days and has not had any nicotine/nicotine products. Cotinine levels are positive indicating nicotine use in last 10 days or so    - Pain management:             Percocet and OxyIR- pt still has rxs from last admission. No new opioid rxs today    - ABL anemia/Hemodynamics             Stable   - Medical issues              Nicotine dependence, alcohol abuse, hepatitis C               Cxr- ATX   - DVT/PE prophylaxis:             Lovenox - ID:              doxycycline and levaquin x 30 days    - FEN/GI prophylaxis/Foley/Lines:             Regular diet   - Impediments to fracture healing:             Open fracture, nicotine  use, alcohol use, drug use   - Dispo:           dc home today             Discussed our concerns with dc home             Still concerned that pt will be noncompliant and resume smoking and drinking. We discussed he has essentially no margin for any progression of infection.  Should this occur he will end up with BKA                Follow up in 1 week     Signed:  Jari Pigg, PA-C [facility] [phone] (P) 11/08/2017, 3:16 PM

## 2018-12-15 ENCOUNTER — Other Ambulatory Visit: Payer: Self-pay

## 2018-12-15 ENCOUNTER — Encounter: Payer: Self-pay | Admitting: Emergency Medicine

## 2018-12-15 ENCOUNTER — Emergency Department: Payer: Medicare Other

## 2018-12-15 ENCOUNTER — Inpatient Hospital Stay
Admission: EM | Admit: 2018-12-15 | Discharge: 2018-12-18 | DRG: 291 | Disposition: A | Discharge status: Home / Self Care | Payer: Medicare Other | Attending: Internal Medicine | Admitting: Internal Medicine

## 2018-12-15 DIAGNOSIS — G40909 Epilepsy, unspecified, not intractable, without status epilepticus: Secondary | ICD-10-CM | POA: Diagnosis present

## 2018-12-15 DIAGNOSIS — R778 Other specified abnormalities of plasma proteins: Secondary | ICD-10-CM

## 2018-12-15 DIAGNOSIS — I509 Heart failure, unspecified: Secondary | ICD-10-CM

## 2018-12-15 DIAGNOSIS — R0902 Hypoxemia: Secondary | ICD-10-CM

## 2018-12-15 DIAGNOSIS — M1990 Unspecified osteoarthritis, unspecified site: Secondary | ICD-10-CM | POA: Diagnosis present

## 2018-12-15 DIAGNOSIS — I5021 Acute systolic (congestive) heart failure: Secondary | ICD-10-CM | POA: Diagnosis present

## 2018-12-15 DIAGNOSIS — B182 Chronic viral hepatitis C: Secondary | ICD-10-CM | POA: Diagnosis present

## 2018-12-15 DIAGNOSIS — I5031 Acute diastolic (congestive) heart failure: Principal | ICD-10-CM | POA: Diagnosis present

## 2018-12-15 DIAGNOSIS — K767 Hepatorenal syndrome: Secondary | ICD-10-CM | POA: Diagnosis present

## 2018-12-15 DIAGNOSIS — I493 Ventricular premature depolarization: Secondary | ICD-10-CM | POA: Diagnosis present

## 2018-12-15 DIAGNOSIS — Z885 Allergy status to narcotic agent status: Secondary | ICD-10-CM

## 2018-12-15 DIAGNOSIS — F17210 Nicotine dependence, cigarettes, uncomplicated: Secondary | ICD-10-CM | POA: Diagnosis present

## 2018-12-15 DIAGNOSIS — J449 Chronic obstructive pulmonary disease, unspecified: Secondary | ICD-10-CM | POA: Diagnosis present

## 2018-12-15 DIAGNOSIS — K7460 Unspecified cirrhosis of liver: Secondary | ICD-10-CM | POA: Diagnosis present

## 2018-12-15 DIAGNOSIS — F419 Anxiety disorder, unspecified: Secondary | ICD-10-CM | POA: Diagnosis present

## 2018-12-15 DIAGNOSIS — Z79899 Other long term (current) drug therapy: Secondary | ICD-10-CM

## 2018-12-15 DIAGNOSIS — D696 Thrombocytopenia, unspecified: Secondary | ICD-10-CM | POA: Diagnosis present

## 2018-12-15 DIAGNOSIS — Z9981 Dependence on supplemental oxygen: Secondary | ICD-10-CM

## 2018-12-15 DIAGNOSIS — E876 Hypokalemia: Secondary | ICD-10-CM | POA: Diagnosis present

## 2018-12-15 DIAGNOSIS — R7989 Other specified abnormal findings of blood chemistry: Secondary | ICD-10-CM

## 2018-12-15 DIAGNOSIS — I248 Other forms of acute ischemic heart disease: Secondary | ICD-10-CM | POA: Diagnosis present

## 2018-12-15 DIAGNOSIS — Z7982 Long term (current) use of aspirin: Secondary | ICD-10-CM

## 2018-12-15 DIAGNOSIS — Z7951 Long term (current) use of inhaled steroids: Secondary | ICD-10-CM

## 2018-12-15 LAB — TROPONIN I
TROPONIN I: 0.03 ng/mL — AB (ref <0.03)
TROPONIN I: 0.05 ng/mL — AB (ref <0.03)

## 2018-12-15 LAB — BASIC METABOLIC PANEL
Anion gap: 12 (ref 5–15)
BUN: <5 mg/dL — ABNORMAL LOW (ref 8–23)
CALCIUM: 7.6 mg/dL — AB (ref 8.9–10.3)
CO2: 19 mmol/L — AB (ref 22–32)
CREATININE: 0.54 mg/dL — AB (ref 0.61–1.24)
Chloride: 99 mmol/L (ref 98–111)
Glucose, Bld: 104 mg/dL — ABNORMAL HIGH (ref 70–99)
Potassium: 3.5 mmol/L (ref 3.5–5.1)
SODIUM: 130 mmol/L — AB (ref 135–145)

## 2018-12-15 LAB — CBC
HCT: 42.8 % (ref 39.0–52.0)
Hemoglobin: 15.1 g/dL (ref 13.0–17.0)
MCH: 37.2 pg — ABNORMAL HIGH (ref 26.0–34.0)
MCHC: 35.3 g/dL (ref 30.0–36.0)
MCV: 105.4 fL — AB (ref 80.0–100.0)
NRBC: 0 % (ref 0.0–0.2)
PLATELETS: 51 10*3/uL — AB (ref 150–400)
RBC: 4.06 MIL/uL — ABNORMAL LOW (ref 4.22–5.81)
RDW: 14.5 % (ref 11.5–15.5)
WBC: 5.2 10*3/uL (ref 4.0–10.5)

## 2018-12-15 LAB — CBC WITH DIFFERENTIAL/PLATELET
Abs Immature Granulocytes: 0.05 10*3/uL (ref 0.00–0.07)
Basophils Absolute: 0.1 10*3/uL (ref 0.0–0.1)
Basophils Relative: 1 %
Eosinophils Absolute: 0 10*3/uL (ref 0.0–0.5)
Eosinophils Relative: 1 %
HEMATOCRIT: 42.7 % (ref 39.0–52.0)
HEMOGLOBIN: 15.1 g/dL (ref 13.0–17.0)
Immature Granulocytes: 1 %
LYMPHS ABS: 1.6 10*3/uL (ref 0.7–4.0)
LYMPHS PCT: 30 %
MCH: 37.3 pg — AB (ref 26.0–34.0)
MCHC: 35.4 g/dL (ref 30.0–36.0)
MCV: 105.4 fL — ABNORMAL HIGH (ref 80.0–100.0)
MONO ABS: 0.6 10*3/uL (ref 0.1–1.0)
Monocytes Relative: 11 %
Neutro Abs: 2.9 10*3/uL (ref 1.7–7.7)
Neutrophils Relative %: 56 %
Platelets: 52 10*3/uL — ABNORMAL LOW (ref 150–400)
RBC: 4.05 MIL/uL — ABNORMAL LOW (ref 4.22–5.81)
RDW: 14.4 % (ref 11.5–15.5)
WBC: 5.2 10*3/uL (ref 4.0–10.5)
nRBC: 0 % (ref 0.0–0.2)

## 2018-12-15 LAB — HEPATIC FUNCTION PANEL
ALK PHOS: 167 U/L — AB (ref 38–126)
ALT: 46 U/L — ABNORMAL HIGH (ref 0–44)
AST: 121 U/L — ABNORMAL HIGH (ref 15–41)
Albumin: 2.6 g/dL — ABNORMAL LOW (ref 3.5–5.0)
BILIRUBIN INDIRECT: 2.2 mg/dL — AB (ref 0.3–0.9)
BILIRUBIN TOTAL: 4 mg/dL — AB (ref 0.3–1.2)
Bilirubin, Direct: 1.8 mg/dL — ABNORMAL HIGH (ref 0.0–0.2)
Total Protein: 6.3 g/dL — ABNORMAL LOW (ref 6.5–8.1)

## 2018-12-15 LAB — BRAIN NATRIURETIC PEPTIDE: B Natriuretic Peptide: 619 pg/mL — ABNORMAL HIGH (ref 0.0–100.0)

## 2018-12-15 MED ORDER — ASPIRIN 81 MG PO CHEW
324.0000 mg | CHEWABLE_TABLET | Freq: Once | ORAL | Status: AC
  Administered 2018-12-15: 324 mg via ORAL
  Filled 2018-12-15: qty 4

## 2018-12-15 MED ORDER — SODIUM CHLORIDE 0.9% FLUSH: 3.0000 mL | Freq: Once | INTRAVENOUS | Status: DC

## 2018-12-15 MED ORDER — FUROSEMIDE 10 MG/ML IJ SOLN
60.0000 mg | Freq: Once | INTRAMUSCULAR | Status: AC
Start: 2018-12-15 — End: 2018-12-15
  Administered 2018-12-15: 60 mg via INTRAVENOUS
  Filled 2018-12-15: qty 8

## 2018-12-15 MED ORDER — METHYLPREDNISOLONE SODIUM SUCC 125 MG IJ SOLR
125.0000 mg | Freq: Once | INTRAMUSCULAR | Status: AC
  Administered 2018-12-15: 125 mg via INTRAVENOUS
  Filled 2018-12-15: qty 2

## 2018-12-15 NOTE — ED Triage Notes (Signed)
Pt reports chest pain for about a week, reports unable to breath, reports takes an full aspirin today. Pt reports took xanax and seizure medication prior to arrival. Pt talks in complete sentences, pt reports has COPD ans wears oxygen PRN.

## 2018-12-15 NOTE — ED Notes (Signed)
Pt oxygen saturation 88% on room air. Pt placed on 2L nasal cannula. Oxygen saturation 96% on 2L. MD aware.

## 2018-12-15 NOTE — ED Provider Notes (Signed)
[facility] Emergency Department Provider Note  ____________________________________________  Time seen: Approximately 9:59 PM  I have reviewed the triage vital signs and the nursing notes.   HISTORY  Chief Complaint Chest Pain    HPI Glen Colon is a 68 y.o. male with a history of cirrhosis, COPD on intermittent oxygen, senting with chest pain, shortness of breath, abdominal distention, and lower extremity swelling.  The patient reports that at baseline, he wakes up every morning and has a chest pain, places oxygen on for 2 hours, and then is "fine" for the rest of the day.  Over the last several days, he has noted that he has had to wear his oxygen more frequently and has felt more short of breath than usual.  He has had increasing abdominal distention which she thinks is worsening his symptoms.  In addition he has worsening bilateral lower extremity edema for the past month.  He has had a mild nonproductive cough without congestion or rhinorrhea, sore throat or ear pain.  He has no fevers or chills.  He has no known sick contacts or travel.  He is not having any chest pain at this time.  Past Medical History:  Diagnosis Date  . Alcohol dependence ([place]) 10/13/2017  . Anemia   . Angina at rest [facility])    "take an aspirin & it goes right away" (10/26/2017)  . Anxiety   . Arthritis    "joints hurt; especially on cold days" (10/26/2017)  . Chronic bronchitis ([place])   . Cirrhosis ([place]) 10/13/2017  . COPD (chronic obstructive pulmonary disease) ([place])   . Epilepsy ([place]) 2018   "no sz if I take RX q night at bedtime" (10/26/2017)  . Hepatitis C, chronic ([place])    "tried tx in 2018; the RX gave me seizures; couldn't walk for 3 days; never finished the the tx" (10/26/2017)  . History of hiatal hernia   . Hypogonadism in male 10/18/2017  . Marijuana abuse 10/13/2017  . Pneumonia 1995  . Seizure ([place]) 2018   "related to hepatitis RX" (10/26/2017)  . Tobacco abuse   .  Vitamin D deficiency 10/18/2017    Patient Active Problem List   Diagnosis Date Noted  . Acute systolic CHF (congestive heart failure) ([place]) 12/15/2018  . Cellulitis of right ankle 10/26/2017  . Vitamin D deficiency 10/18/2017  . Hypogonadism in male 10/18/2017  . Hyponatremia   . Acute blood loss anemia   . Thrombocytopenia ([place])   . Marijuana abuse 10/13/2017  . Alcohol dependence ([place]) 10/13/2017  . Cirrhosis ([place]) 10/13/2017  . Hepatitis C, chronic ([place]) 10/13/2017  . Open pilon fracture of tibia 10/12/2017  . Open fracture of distal end of fibula and tibia 10/11/2017  . Chest pain 01/16/2013  . Tobacco abuse     Past Surgical History:  Procedure Laterality Date  . ESOPHAGOGASTRODUODENOSCOPY    . EXTERNAL FIXATION LEG Right 10/13/2017   Procedure: EXTERNAL FIXATION LEG;  Surgeon: Altamese Pepper Pike, MD;  Location: [place];  Service: Orthopedics;  Laterality: Right;  . FRACTURE SURGERY    . I&D EXTREMITY Right 10/13/2017   Procedure: REPEAT IRRIGATION AND DEBRIDEMENT EXTREMITY WITH ADJUSTMENT OF EXTERNAL FIXITOR;  Surgeon: Altamese Shokan, MD;  Location: [place];  Service: Orthopedics;  Laterality: Right;  . INTRAMEDULLARY (IM) NAIL FIBULA Right 10/13/2017   Procedure: INTRAMEDULLARY (IM) NAIL FIBULA;  Surgeon: Altamese Paoli, MD;  Location: [place];  Service: Orthopedics;  Laterality: Right;  . LIVER CYST REMOVAL    .  [place], "walking pna"  . ORIF ANKLE FRACTURE Right 10/11/2017   Procedure: I & D right ankle with application of external fixator and wound vac;  Surgeon: Hessie Knows, MD;  Location: [facility];  Service: Orthopedics;  Laterality: Right;    Current Outpatient Rx  . Order #: [id number] Class: Normal  . Order #: [id number] Class: Historical Med  . Order #: [id number] Class: Normal  . Order #: [id number] Class: Print  . Order #: [id number] Class: Print  . Order #: [id number] Class: Print  . Order #: [id number] Class: Print  . Order #: [id number] Class: Normal   . Order #: [id number] Class: Print  . Order #: [id number] Class: Print  . Order #: [id number] Class: Print  . Order #: [id number] Class: Print  . Order #: [id number] Class: Historical Med  . Order #: [id number] Class: Print  . Order #: [id number] Class: Print    Allergies Codeine  No family history on file.  Social History Social History   Tobacco Use  . Smoking status: Former Smoker    Packs/day: 1.50    Years: 46.00    Pack years: 69.00    Last attempt to quit: 10/11/2017    Years since quitting: 1.1  . Smokeless tobacco: Former Systems developer    Types: Chew  Substance Use Topics  . Alcohol use: Yes    Comment: 10/26/2017 "12 cans of beer a day  til 10/11/2017; nothing since 10/11/2017)  . Drug use: Yes    Types: Marijuana, Cocaine    Comment: 10/26/2017 "nothing since 1995"    Review of Systems Constitutional: No fever/chills.  No lightheadedness or syncope.  Positive generalized malaise. Eyes: No visual changes. ENT: No sore throat. No congestion or rhinorrhea. Cardiovascular: Positive chest pain. Denies palpitations. Respiratory: Positive shortness of breath.  Positive cough. Gastrointestinal: No abdominal pain.  Positive abdominal distention.  No nausea, no vomiting.  No diarrhea.  No constipation. Genitourinary: Negative for dysuria. Musculoskeletal: Negative for back pain. Skin: Negative for rash. Neurological: Negative for headaches. No focal numbness, tingling or weakness.     ____________________________________________   PHYSICAL EXAM:  VITAL SIGNS: ED Triage Vitals  Enc Vitals Group     BP 12/15/18 1916 119/68     Pulse Rate 12/15/18 1916 93     Resp 12/15/18 1916 (!) 24     Temp 12/15/18 1916 99.6 F (37.6 C)     Temp Source 12/15/18 1916 Oral     SpO2 12/15/18 1916 91 %     Weight 12/15/18 1917 220 lb (99.8 kg)     Height 12/15/18 1917 5\' 10"  (1.778 m)     Head Circumference --      Peak Flow --      Pain Score 12/15/18 1916 5     Pain Loc --      Pain Edu? --       Excl. in [place]? --     Constitutional: Alert and oriented.  Answers questions appropriately.  Chronically ill-appearing.   Eyes: Conjunctivae are normal.  EOMI. positive scleral icterus. Head: Atraumatic. Nose: No congestion/rhinnorhea. Mouth/Throat: Mucous membranes are moist.  Neck: No stridor.  Supple.  Mild JVD.  No meningismus. Cardiovascular: Fast rate, regular rhythm. No murmurs, rubs or gallops.  Respiratory: The patient is tachypneic without any accessory muscle use or retractions.  He has a prolonged expiratory phase without wheezes rales or rhonchi.  His oxygen saturation does go down to 90/91% when he talks without any significant exertion. Gastrointestinal: Soft, and significantly distended with a fluid wave.  No focal tenderness to palpation..  No guarding or rebound.  No peritoneal signs. Musculoskeletal: Significant bilateral pitting LE edema to the mid thigh. No ttp in the calves or palpable cords.  Negative Homan's sign. Neurologic:  A&Ox3.  Speech is clear.  Face and smile are symmetric.  EOMI.  Moves all extremities well. Skin:  Skin is warm, dry and intact. No rash noted. Psychiatric: Mood and affect are normal. Speech and behavior are normal.  Normal judgement.  ____________________________________________   LABS (all labs ordered are listed, but only abnormal results are displayed)  Labs Reviewed  BASIC METABOLIC PANEL - Abnormal; Notable for the following components:      Result Value   Sodium 130 (*)    CO2 19 (*)    Glucose, Bld 104 (*)    BUN <5 (*)    Creatinine, Ser 0.54 (*)    Calcium 7.6 (*)    All other components within normal limits  CBC - Abnormal; Notable for the following components:   RBC 4.06 (*)    MCV 105.4 (*)    MCH 37.2 (*)    Platelets 51 (*)    All other components within normal limits  TROPONIN I - Abnormal; Notable for the following components:   Troponin I 0.03 (*)    All other components within normal limits  BRAIN  NATRIURETIC PEPTIDE - Abnormal; Notable for the following components:   B Natriuretic Peptide 619.0 (*)    All other components within normal limits  CBC WITH DIFFERENTIAL/PLATELET - Abnormal; Notable for the following components:   RBC 4.05 (*)    MCV 105.4 (*)    MCH 37.3 (*)    Platelets 52 (*)    All other components within normal limits  HEPATIC FUNCTION PANEL - Abnormal; Notable for the following components:   Total Protein 6.3 (*)    Albumin 2.6 (*)    AST 121 (*)    ALT 46 (*)    Alkaline Phosphatase 167 (*)    Total Bilirubin 4.0 (*)    Bilirubin, Direct 1.8 (*)    Indirect Bilirubin 2.2 (*)    All other components within normal limits  TROPONIN I - Abnormal; Notable for the following components:   Troponin I 0.05 (*)    All other components within normal limits   ____________________________________________  EKG  ED ECG REPORT I, Anne-Caroline Mariea Clonts, the attending physician, personally viewed and interpreted this ECG.   Date: 12/15/2018  EKG Time: 1909  Rate: 97  Rhythm: normal sinus rhythm; RBBB; PVC  Axis: leftward  Intervals:none  ST&T Change: No STEMI  ____________________________________________  RADIOLOGY  Dg Chest 2 View  Result Date: 12/15/2018 CLINICAL DATA:  Chest pain for 1 week, unable to breathe, history COPD, on home oxygen as needed, smoker, cirrhosis, hepatitis EXAM: CHEST - 2 VIEW COMPARISON:  10/28/2017 FINDINGS: Enlargement of cardiac silhouette with pulmonary vascular congestion. Mediastinal contours normal. Minimal LEFT basilar atelectasis, improved. Emphysematous changes without infiltrate, pleural effusion or pneumothorax. Compression fractures of adjacent vertebra in the midthoracic spine unchanged since an earlier study of 01/11/2013. IMPRESSION: Enlargement of cardiac silhouette with pulmonary vascular congestion. COPD changes with minimal LEFT basilar atelectasis, improved since prior exam. Electronically Signed   By: Lavonia Dana M.D.    On: 12/15/2018 21:38    ____________________________________________   PROCEDURES  Procedure(s) performed: None  Procedures  Critical Care performed: Yes, see critical care note(s) ____________________________________________   INITIAL IMPRESSION / ASSESSMENT AND PLAN / ED COURSE  Pertinent labs & imaging results that were available during my care of the patient were reviewed by me and considered in my medical decision making (see chart for details).  68 y.o. male with multiple chronic comorbidities presenting with increased oxygen requirement, cough, increased abdominal distention and lower extremity swelling.  Overall, the patient symptoms are concerning for multiple possible etiologies.  He does not have any ischemic changes on his EKG but he does have an upward trending troponin which may be due to strain.  At this time he is not having pain so treat with an aspirin.  His BNP is still elevated; Lasix has been ordered.  He has enlargement of his cardiac silhouette and some pulmonary vascular congestion which is concerning for CHF.  His baseline LFTs are elevated, and his bilirubin is slightly higher today than usual.     CRITICAL CARE Performed by: Eula Listen   Total critical care time: 35 minutes  Critical care time was exclusive of separately billable procedures and treating other patients.  Critical care was necessary to treat or prevent imminent or life-threatening deterioration.  Critical care was time spent personally by me on the following activities: development of treatment plan with patient and/or surrogate as well as nursing, discussions with consultants, evaluation of patient's response to treatment, examination of patient, obtaining history from patient or surrogate, ordering and performing treatments and interventions, ordering and review of laboratory studies, ordering and review of radiographic studies, pulse oximetry and re-evaluation of patient's  condition.   ____________________________________________  FINAL CLINICAL IMPRESSION(S) / ED DIAGNOSES  Final diagnoses:  Hypoxia  Acute congestive heart failure, unspecified heart failure type ([place])         NEW MEDICATIONS STARTED DURING THIS VISIT:  New Prescriptions   No medications on file      Eula Listen, MD 12/15/18 2338

## 2018-12-16 ENCOUNTER — Emergency Department: Payer: Medicare Other

## 2018-12-16 ENCOUNTER — Inpatient Hospital Stay
Admit: 2018-12-16 | Discharge: 2018-12-16 | Disposition: A | Discharge status: Home / Self Care | Payer: Medicare Other | Attending: Internal Medicine | Admitting: Internal Medicine

## 2018-12-16 ENCOUNTER — Other Ambulatory Visit: Payer: Self-pay

## 2018-12-16 DIAGNOSIS — M1990 Unspecified osteoarthritis, unspecified site: Secondary | ICD-10-CM | POA: Diagnosis present

## 2018-12-16 DIAGNOSIS — E876 Hypokalemia: Secondary | ICD-10-CM | POA: Diagnosis present

## 2018-12-16 DIAGNOSIS — R0902 Hypoxemia: Secondary | ICD-10-CM | POA: Diagnosis present

## 2018-12-16 DIAGNOSIS — I5031 Acute diastolic (congestive) heart failure: Secondary | ICD-10-CM | POA: Diagnosis present

## 2018-12-16 DIAGNOSIS — F419 Anxiety disorder, unspecified: Secondary | ICD-10-CM | POA: Diagnosis present

## 2018-12-16 DIAGNOSIS — I248 Other forms of acute ischemic heart disease: Secondary | ICD-10-CM | POA: Diagnosis present

## 2018-12-16 DIAGNOSIS — Z7951 Long term (current) use of inhaled steroids: Secondary | ICD-10-CM | POA: Diagnosis not present

## 2018-12-16 DIAGNOSIS — G40909 Epilepsy, unspecified, not intractable, without status epilepticus: Secondary | ICD-10-CM | POA: Diagnosis present

## 2018-12-16 DIAGNOSIS — K7460 Unspecified cirrhosis of liver: Secondary | ICD-10-CM | POA: Diagnosis present

## 2018-12-16 DIAGNOSIS — Z885 Allergy status to narcotic agent status: Secondary | ICD-10-CM | POA: Diagnosis not present

## 2018-12-16 DIAGNOSIS — F17210 Nicotine dependence, cigarettes, uncomplicated: Secondary | ICD-10-CM | POA: Diagnosis present

## 2018-12-16 DIAGNOSIS — I509 Heart failure, unspecified: Secondary | ICD-10-CM

## 2018-12-16 DIAGNOSIS — Z9981 Dependence on supplemental oxygen: Secondary | ICD-10-CM | POA: Diagnosis not present

## 2018-12-16 DIAGNOSIS — Z7982 Long term (current) use of aspirin: Secondary | ICD-10-CM | POA: Diagnosis not present

## 2018-12-16 DIAGNOSIS — B182 Chronic viral hepatitis C: Secondary | ICD-10-CM | POA: Diagnosis present

## 2018-12-16 DIAGNOSIS — K767 Hepatorenal syndrome: Secondary | ICD-10-CM | POA: Diagnosis present

## 2018-12-16 DIAGNOSIS — Z79899 Other long term (current) drug therapy: Secondary | ICD-10-CM | POA: Diagnosis not present

## 2018-12-16 DIAGNOSIS — J449 Chronic obstructive pulmonary disease, unspecified: Secondary | ICD-10-CM | POA: Diagnosis present

## 2018-12-16 DIAGNOSIS — D696 Thrombocytopenia, unspecified: Secondary | ICD-10-CM | POA: Diagnosis present

## 2018-12-16 DIAGNOSIS — I493 Ventricular premature depolarization: Secondary | ICD-10-CM | POA: Diagnosis present

## 2018-12-16 LAB — ECHOCARDIOGRAM COMPLETE
Height: 70 in
Weight: 3619.2 oz

## 2018-12-16 LAB — TROPONIN I
Troponin I: 0.03 ng/mL (ref <0.03)
Troponin I: 0.03 ng/mL (ref <0.03)
Troponin I: 0.04 ng/mL (ref <0.03)

## 2018-12-16 LAB — HEMOGLOBIN A1C
Hgb A1c MFr Bld: 3.8 % — ABNORMAL LOW (ref 4.8–5.6)
Mean Plasma Glucose: 62.36 mg/dL

## 2018-12-16 LAB — TSH: TSH: 1.088 u[IU]/mL (ref 0.350–4.500)

## 2018-12-16 MED ORDER — ORAL CARE MOUTH RINSE
15.0000 mL | Freq: Two times a day (BID) | OROMUCOSAL | Status: DC
  Administered 2018-12-16 – 2018-12-18 (×2): 15 mL via OROMUCOSAL

## 2018-12-16 MED ORDER — PHENOBARBITAL 97.2 MG PO TABS
97.2000 mg | ORAL_TABLET | Freq: Every day | ORAL | Status: DC
  Administered 2018-12-16 – 2018-12-17 (×2): 97.2 mg via ORAL
  Filled 2018-12-16: qty 1
  Filled 2018-12-16: qty 3

## 2018-12-16 MED ORDER — ASPIRIN EC 325 MG PO TBEC
325.0000 mg | DELAYED_RELEASE_TABLET | Freq: Every day | ORAL | Status: DC
  Administered 2018-12-16 – 2018-12-18 (×3): 325 mg via ORAL
  Filled 2018-12-16 (×3): qty 1

## 2018-12-16 MED ORDER — ACETAMINOPHEN 650 MG RE SUPP: 650.0000 mg | Freq: Four times a day (QID) | RECTAL | Status: DC | PRN

## 2018-12-16 MED ORDER — FUROSEMIDE 10 MG/ML IJ SOLN
20.0000 mg | Freq: Four times a day (QID) | INTRAMUSCULAR | Status: AC
  Administered 2018-12-16 (×3): 20 mg via INTRAVENOUS
  Filled 2018-12-16 (×3): qty 2

## 2018-12-16 MED ORDER — ONDANSETRON HCL 4 MG PO TABS: 4.0000 mg | ORAL_TABLET | Freq: Four times a day (QID) | ORAL | Status: DC | PRN

## 2018-12-16 MED ORDER — ALPRAZOLAM 1 MG PO TABS
1.0000 mg | ORAL_TABLET | Freq: Two times a day (BID) | ORAL | Status: DC
  Administered 2018-12-16 – 2018-12-18 (×5): 1 mg via ORAL
  Filled 2018-12-16 (×5): qty 1

## 2018-12-16 MED ORDER — MOMETASONE FURO-FORMOTEROL FUM 200-5 MCG/ACT IN AERO
2.0000 | INHALATION_SPRAY | Freq: Two times a day (BID) | RESPIRATORY_TRACT | Status: DC
  Administered 2018-12-16 – 2018-12-18 (×5): 2 via RESPIRATORY_TRACT
  Filled 2018-12-16: qty 8.8

## 2018-12-16 MED ORDER — METOPROLOL SUCCINATE ER 25 MG PO TB24
25.0000 mg | ORAL_TABLET | Freq: Every day | ORAL | Status: DC
  Administered 2018-12-17: 25 mg via ORAL
  Filled 2018-12-16 (×3): qty 1

## 2018-12-16 MED ORDER — PHENOBARBITAL 100 MG PO TABS
100.0000 mg | ORAL_TABLET | Freq: Every day | ORAL | Status: DC
  Filled 2018-12-16: qty 1

## 2018-12-16 MED ORDER — ONDANSETRON HCL 4 MG/2ML IJ SOLN: 4.0000 mg | Freq: Four times a day (QID) | INTRAMUSCULAR | Status: DC | PRN

## 2018-12-16 MED ORDER — DOCUSATE SODIUM 100 MG PO CAPS
100.0000 mg | ORAL_CAPSULE | Freq: Two times a day (BID) | ORAL | Status: DC
  Administered 2018-12-16 – 2018-12-17 (×4): 100 mg via ORAL
  Filled 2018-12-16 (×5): qty 1

## 2018-12-16 MED ORDER — ACETAMINOPHEN 325 MG PO TABS: 650.0000 mg | ORAL_TABLET | Freq: Four times a day (QID) | ORAL | Status: DC | PRN

## 2018-12-16 NOTE — ED Notes (Signed)
.. ED TO INPATIENT HANDOFF REPORT  ED Nurse Name and Phone #: Deneise Lever [phone]  S Name/Age/Gender Glen Colon 68 y.o. male Room/Bed: ED24A/ED24A  Code Status   Code Status: Prior  Home/SNF/Other Home Patient oriented to: self, place, time and situation Is this baseline? Yes   Triage Complete: Triage complete  Chief Complaint chest pain  Triage Note Pt reports chest pain for about a week, reports unable to breath, reports takes an full aspirin today. Pt reports took xanax and seizure medication prior to arrival. Pt talks in complete sentences, pt reports has COPD ans wears oxygen PRN.    Allergies Allergies  Allergen Reactions  . Codeine Nausea And Vomiting and Other (See Comments)    CHILLS & SWEATS "DEATHLY ILL" per Pt description    Level of Care/Admitting Diagnosis ED Disposition    ED Disposition Condition Concow: Glen Colon [[id number]]  Level of Care: Telemetry [5]  Diagnosis: New onset of congestive heart failure [facility]) [[id number]]  Admitting Physician: Harrie Foreman [[id number]]  Attending Physician: Harrie Foreman [[id number]]  Estimated length of stay: past midnight tomorrow  Certification:: I certify this patient will need inpatient services for at least 2 midnights  PT Class (Do Not Modify): Inpatient [101]  PT Acc Code (Do Not Modify): Private [1]       B Medical/Surgery History Past Medical History:  Diagnosis Date  . Alcohol dependence ([place]) 10/13/2017  . Anemia   . Angina at rest [facility])    "take an aspirin & it goes right away" (10/26/2017)  . Anxiety   . Arthritis    "joints hurt; especially on cold days" (10/26/2017)  . Chronic bronchitis ([place])   . Cirrhosis ([place]) 10/13/2017  . COPD (chronic obstructive pulmonary disease) ([place])   . Epilepsy ([place]) 2018   "no sz if I take RX q night at bedtime" (10/26/2017)  . Hepatitis C, chronic ([place])    "tried tx in 2018; the RX gave me seizures; couldn't walk for 3  days; never finished the the tx" (10/26/2017)  . History of hiatal hernia   . Hypogonadism in male 10/18/2017  . Marijuana abuse 10/13/2017  . Pneumonia 1995  . Seizure ([place]) 2018   "related to hepatitis RX" (10/26/2017)  . Tobacco abuse   . Vitamin D deficiency 10/18/2017   Past Surgical History:  Procedure Laterality Date  . ESOPHAGOGASTRODUODENOSCOPY    . EXTERNAL FIXATION LEG Right 10/13/2017   Procedure: EXTERNAL FIXATION LEG;  Surgeon: Altamese Macclesfield, MD;  Location: [place];  Service: Orthopedics;  Laterality: Right;  . FRACTURE SURGERY    . I&D EXTREMITY Right 10/13/2017   Procedure: REPEAT IRRIGATION AND DEBRIDEMENT EXTREMITY WITH ADJUSTMENT OF EXTERNAL FIXITOR;  Surgeon: Altamese [place], MD;  Location: [place];  Service: Orthopedics;  Laterality: Right;  . INTRAMEDULLARY (IM) NAIL FIBULA Right 10/13/2017   Procedure: INTRAMEDULLARY (IM) NAIL FIBULA;  Surgeon: Altamese [place]oss[place], MD;  Location: [place];  Service: Orthopedics;  Laterality: Right;  . LIVER CYST REMOVAL    . [place], "walking pna"  . ORIF ANKLE FRACTURE Right 10/11/2017   Procedure: I & D right ankle with application of external fixator and wound vac;  Surgeon: Hessie Knows, MD;  Location: [facility];  Service: Orthopedics;  Laterality: Right;     A IV Location/Drains/Wounds Patient Lines/Drains/Airways Status   Active Line/Drains/Airways    Name:   Placement date:   Placement time:  Site:   Days:   Peripheral IV 12/15/18 Right Antecubital   12/15/18    2225    Antecubital   1   Negative Pressure Wound Therapy Ankle Right   10/13/17    1313    -   429   Incision (Closed) 10/12/17 Ankle Right   10/12/17    0102     430   Incision (Closed) 10/13/17 Leg Right   10/13/17    1231     429   EXTERNAL FIXATOR   10/13/17    1334    Ankle   429          Intake/Output Last 24 hours No intake or output data in the 24 hours ending 12/16/18 0310  Labs/Imaging Results for orders placed or performed during the  hospital encounter of 12/15/18 (from the past 48 hour(s))  Basic metabolic panel     Status: Abnormal   Collection Time: 12/15/18  7:21 PM  Result Value Ref Range   Sodium 130 (L) 135 - 145 mmol/L   Potassium 3.5 3.5 - 5.1 mmol/L   Chloride 99 98 - 111 mmol/L   CO2 19 (L) 22 - 32 mmol/L   Glucose, Bld 104 (H) 70 - 99 mg/dL   BUN <5 (L) 8 - 23 mg/dL   Creatinine, Ser 0.54 (L) 0.61 - 1.24 mg/dL   Calcium 7.6 (L) 8.9 - 10.3 mg/dL   GFR calc non Af Amer >60 >60 mL/min   GFR calc Af Amer >60 >60 mL/min   Anion gap 12 5 - 15    Comment: Performed at [facility], [address] [place], [place] [address]  CBC     Status: Abnormal   Collection Time: 12/15/18  7:21 PM  Result Value Ref Range   WBC 5.2 4.0 - 10.5 K/uL   RBC 4.06 (L) 4.22 - 5.81 MIL/uL   Hemoglobin 15.1 13.0 - 17.0 g/dL   HCT 42.8 39.0 - 52.0 %   MCV 105.4 (H) 80.0 - 100.0 fL   MCH 37.2 (H) 26.0 - 34.0 pg   MCHC 35.3 30.0 - 36.0 g/dL   RDW 14.5 11.5 - 15.5 %   Platelets 51 (L) 150 - 400 K/uL    Comment: Immature Platelet Fraction may be clinically indicated, consider ordering this additional test [id number]    nRBC 0.0 0.0 - 0.2 %    Comment: Performed at [facility], [address] [place], [place] [address]  Troponin I - ONCE - STAT     Status: Abnormal   Collection Time: 12/15/18  7:21 PM  Result Value Ref Range   Troponin I 0.03 (HH) <0.03 ng/mL    Comment: CRITICAL RESULT CALLED TO, READ BACK BY AND VERIFIED WITH BECCA LYNN 12/15/18 @ 2017  [facility] Performed at [facility], [address] [place], [place] [address]   Brain natriuretic peptide     Status: Abnormal   Collection Time: 12/15/18  7:21 PM  Result Value Ref Range   B Natriuretic Peptide 619.0 (H) 0.0 - 100.0 pg/mL    Comment: Performed at [facility], [address] [place], [place] [address]  CBC with Differential     Status: Abnormal   Collection Time: 12/15/18  7:21 PM  Result Value Ref Range   WBC 5.2  4.0 - 10.5 K/uL   RBC 4.05 (L) 4.22 - 5.81 MIL/uL   Hemoglobin 15.1 13.0 - 17.0 g/dL   HCT 42.7 39.0 - 52.0 %  MCV 105.4 (H) 80.0 - 100.0 fL   MCH 37.3 (H) 26.0 - 34.0 pg   MCHC 35.4 30.0 - 36.0 g/dL   RDW 14.4 11.5 - 15.5 %   Platelets 52 (L) 150 - 400 K/uL    Comment: Immature Platelet Fraction may be clinically indicated, consider ordering this additional test [id number]    nRBC 0.0 0.0 - 0.2 %   Neutrophils Relative % 56 %   Neutro Abs 2.9 1.7 - 7.7 K/uL   Lymphocytes Relative 30 %   Lymphs Abs 1.6 0.7 - 4.0 K/uL   Monocytes Relative 11 %   Monocytes Absolute 0.6 0.1 - 1.0 K/uL   Eosinophils Relative 1 %   Eosinophils Absolute 0.0 0.0 - 0.5 K/uL   Basophils Relative 1 %   Basophils Absolute 0.1 0.0 - 0.1 K/uL   Immature Granulocytes 1 %   Abs Immature Granulocytes 0.05 0.00 - 0.07 K/uL    Comment: Performed at [facility], [address] [place], [place] [address]  Hepatic function panel     Status: Abnormal   Collection Time: 12/15/18  7:21 PM  Result Value Ref Range   Total Protein 6.3 (L) 6.5 - 8.1 g/dL   Albumin 2.6 (L) 3.5 - 5.0 g/dL   AST 121 (H) 15 - 41 U/L   ALT 46 (H) 0 - 44 U/L   Alkaline Phosphatase 167 (H) 38 - 126 U/L   Total Bilirubin 4.0 (H) 0.3 - 1.2 mg/dL   Bilirubin, Direct 1.8 (H) 0.0 - 0.2 mg/dL   Indirect Bilirubin 2.2 (H) 0.3 - 0.9 mg/dL    Comment: Performed at [facility], [address] [place], [place] [address]  Troponin I - ONCE - STAT     Status: Abnormal   Collection Time: 12/15/18 10:14 PM  Result Value Ref Range   Troponin I 0.05 (HH) <0.03 ng/mL    Comment: CRITICAL VALUE NOTED. VALUE IS CONSISTENT WITH PREVIOUSLY REPORTED/CALLED VALUE.  TFK Performed at [facility], [address] [place], [place] [address]    Dg Chest 2 View  Result Date: 12/15/2018 CLINICAL DATA:  Chest pain for 1 week, unable to breathe, history COPD, on home oxygen as needed, smoker, cirrhosis, hepatitis EXAM: CHEST - 2 VIEW  COMPARISON:  10/28/2017 FINDINGS: Enlargement of cardiac silhouette with pulmonary vascular congestion. Mediastinal contours normal. Minimal LEFT basilar atelectasis, improved. Emphysematous changes without infiltrate, pleural effusion or pneumothorax. Compression fractures of adjacent vertebra in the midthoracic spine unchanged since an earlier study of 01/11/2013. IMPRESSION: Enlargement of cardiac silhouette with pulmonary vascular congestion. COPD changes with minimal LEFT basilar atelectasis, improved since prior exam. Electronically Signed   By: Lavonia Dana M.D.   On: 12/15/2018 21:38   US Venous Img Lower Bilateral  Result Date: 12/16/2018 CLINICAL DATA:  BILATERAL lower extremity swelling for 1 month, history cirrhosis, COPD EXAM: BILATERAL LOWER EXTREMITY VENOUS DOPPLER ULTRASOUND TECHNIQUE: Gray-scale sonography with graded compression, as well as color Doppler and duplex ultrasound were performed to evaluate the lower extremity deep venous systems from the level of the common femoral vein and including the common femoral, femoral, profunda femoral, popliteal and calf veins including the posterior tibial, peroneal and gastrocnemius veins when visible. The superficial great saphenous vein was also interrogated. Spectral Doppler was utilized to evaluate flow at rest and with distal augmentation maneuvers in the common femoral, femoral and popliteal veins. COMPARISON:  None FINDINGS: RIGHT LOWER EXTREMITY Common Femoral Vein: No evidence of thrombus. Normal compressibility, respiratory  phasicity and response to augmentation. Saphenofemoral Junction: No evidence of thrombus. Normal compressibility and flow on color Doppler imaging. Profunda Femoral Vein: No evidence of thrombus. Normal compressibility and flow on color Doppler imaging. Femoral Vein: No evidence of thrombus. Normal compressibility, respiratory phasicity and response to augmentation. Popliteal Vein: No evidence of thrombus. Normal  compressibility, respiratory phasicity and response to augmentation. Calf Veins: No evidence of thrombus. Normal compressibility and flow on color Doppler imaging. Superficial Great Saphenous Vein: No evidence of thrombus. Normal compressibility. Venous Reflux:  None. Other Findings:  Scattered subcutaneous edema. LEFT LOWER EXTREMITY Common Femoral Vein: No evidence of thrombus. Normal compressibility, respiratory phasicity and response to augmentation. Saphenofemoral Junction: No evidence of thrombus. Normal compressibility and flow on color Doppler imaging. Profunda Femoral Vein: No evidence of thrombus. Normal compressibility and flow on color Doppler imaging. Femoral Vein: No evidence of thrombus. Normal compressibility, respiratory phasicity and response to augmentation. Popliteal Vein: No evidence of thrombus. Normal compressibility, respiratory phasicity and response to augmentation. Calf Veins: No evidence of thrombus. Normal compressibility and flow on color Doppler imaging. Superficial Great Saphenous Vein: No evidence of thrombus. Normal compressibility. Venous Reflux:  None. Other Findings: Small Baker cyst 1.3 x 1.7 x 4.7 cm. Scattered subcutaneous edema. IMPRESSION: No evidence of deep venous thrombosis in either lower extremity. Small Baker's cyst LEFT popliteal fossa. Electronically Signed   By: Lavonia Dana M.D.   On: 12/16/2018 01:49    Pending Labs [employer] (From admission, onward)    Start     Ordered   Signed and Held  TSH  Add-on,   R     Signed and Held   Signed and Held  Hemoglobin A1c  Add-on,   R     Signed and Held   Signed and Held  Troponin I - Now Then Q6H  Now then every 6 hours,   R     Signed and Held          Vitals/Pain Today's Vitals   12/15/18 2051 12/15/18 2100 12/16/18 0000 12/16/18 0309  BP:  111/71 (!) 126/93 124/80  Pulse: 93 94 95 90  Resp:  (!) 21 20 18   Temp:    99.7 F (37.6 C)  TempSrc:    Oral  SpO2: 91% 90% 92% 92%  Weight:       Height:      PainSc:        Isolation Precautions No active isolations  Medications Medications  sodium chloride flush (NS) 0.9 % injection 3 mL (has no administration in time range)  methylPREDNISolone sodium succinate (SOLU-MEDROL) 125 mg/2 mL injection 125 mg (125 mg Intravenous Given 12/15/18 2215)  aspirin chewable tablet 324 mg (324 mg Oral Given 12/15/18 2337)  furosemide (LASIX) injection 60 mg (60 mg Intravenous Given 12/15/18 2340)    Mobility walks High fall risk   Focused Assessments Pulmonary Assessment Handoff:  Lung sounds: Bilateral Breath Sounds: Diminished L Breath Sounds: Diminished R Breath Sounds: Diminished O2 Device: Nasal Cannula O2 Flow Rate (L/min): 2 L/min      R Recommendations: See Admitting Provider Note  Report given to:   Additional Notes:

## 2018-12-16 NOTE — Progress Notes (Signed)
[place] [facility] [place] [name]    MR#:  [id number]  DATE OF BIRTH:  [birth year]  SUBJECTIVE:  CHIEF COMPLAINT:   Chief Complaint  Patient presents with  . Chest Pain   No new complaint this morning.  Shortness of breath gradually improving.  2D echocardiogram being done at bedside.  No fevers.  REVIEW OF SYSTEMS:  Review of Systems  Constitutional: Negative for chills and fever.  HENT: Negative for ear pain, hearing loss and tinnitus.   Eyes: Negative for blurred vision and double vision.  Respiratory: Positive for shortness of breath. Negative for cough and hemoptysis.   Cardiovascular: Positive for leg swelling. Negative for chest pain.  Gastrointestinal: Negative for heartburn and nausea.  Genitourinary: Negative for dysuria and frequency.  Musculoskeletal: Negative for myalgias and neck pain.  Skin: Negative for itching and rash.  Neurological: Negative for dizziness and headaches.  Psychiatric/Behavioral: Negative for depression and hallucinations.    DRUG ALLERGIES:   Allergies  Allergen Reactions  . Codeine Nausea And Vomiting and Other (See Comments)    CHILLS & SWEATS "DEATHLY ILL" per Pt description   VITALS:  Blood pressure 113/67, pulse 99, temperature 97.7 F (36.5 C), temperature source Oral, resp. rate 19, height 5\' 10"  (1.778 m), weight 102.6 kg, SpO2 91 %. PHYSICAL EXAMINATION:   Physical Exam  Constitutional: He is oriented to person, place, and time. He appears well-developed and well-nourished.  HENT:  Head: Normocephalic and atraumatic.  Right Ear: External ear normal.  Eyes: Pupils are equal, round, and reactive to light. Conjunctivae are normal. Right eye exhibits no discharge.  Neck: Normal range of motion. Neck supple. No thyromegaly present.  Cardiovascular: Normal rate, regular rhythm and normal heart sounds.  Respiratory: Effort normal. He has rales.  GI: Soft. Bowel sounds are normal.  He exhibits no distension. There is no abdominal tenderness.  Musculoskeletal: Normal range of motion.        General: Edema present. No tenderness.  Neurological: He is alert and oriented to person, place, and time. No cranial nerve deficit.  Skin: Skin is warm. He is not diaphoretic. No erythema.  Psychiatric: He has a normal mood and affect. His behavior is normal.   LABORATORY PANEL:  Male CBC Recent Labs  Lab 12/15/18 1921  WBC 5.2  5.2  HGB 15.1  15.1  HCT 42.7  42.8  PLT 52*  51*   ------------------------------------------------------------------------------------------------------------------ Chemistries  Recent Labs  Lab 12/15/18 1921  NA 130*  K 3.5  CL 99  CO2 19*  GLUCOSE 104*  BUN <5*  CREATININE 0.54*  CALCIUM 7.6*  AST 121*  ALT 46*  ALKPHOS 167*  BILITOT 4.0*   RADIOLOGY:  Dg Chest 2 View  Result Date: 12/15/2018 CLINICAL DATA:  Chest pain for 1 week, unable to breathe, history COPD, on home oxygen as needed, smoker, cirrhosis, hepatitis EXAM: CHEST - 2 VIEW COMPARISON:  10/28/2017 FINDINGS: Enlargement of cardiac silhouette with pulmonary vascular congestion. Mediastinal contours normal. Minimal LEFT basilar atelectasis, improved. Emphysematous changes without infiltrate, pleural effusion or pneumothorax. Compression fractures of adjacent vertebra in the midthoracic spine unchanged since an earlier study of 01/11/2013. IMPRESSION: Enlargement of cardiac silhouette with pulmonary vascular congestion. COPD changes with minimal LEFT basilar atelectasis, improved since prior exam. Electronically Signed   By: Lavonia Dana M.D.   On: 12/15/2018 21:38   US Venous Img Lower Bilateral  Result Date: 12/16/2018 CLINICAL DATA:  BILATERAL lower extremity swelling  for 1 month, history cirrhosis, COPD EXAM: BILATERAL LOWER EXTREMITY VENOUS DOPPLER ULTRASOUND TECHNIQUE: Gray-scale sonography with graded compression, as well as color Doppler and duplex ultrasound were  performed to evaluate the lower extremity deep venous systems from the level of the common femoral vein and including the common femoral, femoral, profunda femoral, popliteal and calf veins including the posterior tibial, peroneal and gastrocnemius veins when visible. The superficial great saphenous vein was also interrogated. Spectral Doppler was utilized to evaluate flow at rest and with distal augmentation maneuvers in the common femoral, femoral and popliteal veins. COMPARISON:  None FINDINGS: RIGHT LOWER EXTREMITY Common Femoral Vein: No evidence of thrombus. Normal compressibility, respiratory phasicity and response to augmentation. Saphenofemoral Junction: No evidence of thrombus. Normal compressibility and flow on color Doppler imaging. Profunda Femoral Vein: No evidence of thrombus. Normal compressibility and flow on color Doppler imaging. Femoral Vein: No evidence of thrombus. Normal compressibility, respiratory phasicity and response to augmentation. Popliteal Vein: No evidence of thrombus. Normal compressibility, respiratory phasicity and response to augmentation. Calf Veins: No evidence of thrombus. Normal compressibility and flow on color Doppler imaging. Superficial Great Saphenous Vein: No evidence of thrombus. Normal compressibility. Venous Reflux:  None. Other Findings:  Scattered subcutaneous edema. LEFT LOWER EXTREMITY Common Femoral Vein: No evidence of thrombus. Normal compressibility, respiratory phasicity and response to augmentation. Saphenofemoral Junction: No evidence of thrombus. Normal compressibility and flow on color Doppler imaging. Profunda Femoral Vein: No evidence of thrombus. Normal compressibility and flow on color Doppler imaging. Femoral Vein: No evidence of thrombus. Normal compressibility, respiratory phasicity and response to augmentation. Popliteal Vein: No evidence of thrombus. Normal compressibility, respiratory phasicity and response to augmentation. Calf Veins: No  evidence of thrombus. Normal compressibility and flow on color Doppler imaging. Superficial Great Saphenous Vein: No evidence of thrombus. Normal compressibility. Venous Reflux:  None. Other Findings: Small Baker cyst 1.3 x 1.7 x 4.7 cm. Scattered subcutaneous edema. IMPRESSION: No evidence of deep venous thrombosis in either lower extremity. Small Baker's cyst LEFT popliteal fossa. Electronically Signed   By: Lavonia Dana M.D.   On: 12/16/2018 01:49   ASSESSMENT AND PLAN:   This is a 68 year old male admitted for new onset heart failure.  1.  Acute diastolic CHF exacerbation Being diuresed with IV Lasix.  Started metoprolol XL 25 mg p.o. daily 2D echocardiogram done with ejection fraction of 60 to 65%. Likely underlying hepatorenal syndrome given history of liver cirrhosis. Follow-up cardiology input. Monitor clinically  2.  Elevated troponin: Likely secondary to demand ischemia. Troponin stable at 0.03. Continue aspirin and beta-blockers.  Follow-up on cardiology input  3.  COPD: Stable; continue inhaled corticosteroid.  Albuterol as needed.  4.  Seizure disorder: Stable; continue phenobarbital  5.  History of liver cirrhosis secondary to hepatitis C Follow-up with gastroenterology service  6.  Thrombocytopenia with platelet count of 52 Secondary to liver cirrhosis.  No evidence of bleeding.  Monitor clinically   DVT prophylaxis: SCDs due to thrombocytopenia   All the records are reviewed and case discussed with Care Management/Social Worker. Management plans discussed with the patient, family and they are in agreement.  CODE STATUS: Full Code  TOTAL TIME TAKING CARE OF THIS PATIENT: 37 minutes.   More than 50% of the time was spent in counseling/coordination of care: YES  POSSIBLE D/C IN 2 DAYS, DEPENDING ON CLINICAL CONDITION.   Kenyah Luba M.D on 12/16/2018 at 1:14 PM  Between 7am to 6pm - Pager - [phone]  After 6pm go to [email or web address] -  password [email or web address] [facility]  [facility]  [facility] [place] [facility]  Office  [phone]  CC: Primary care physician; Alvester Chou, NP  Note: This dictation was prepared with Dragon dictation along with smaller phrase technology. Any transcriptional errors that result from this process are unintentional.

## 2018-12-16 NOTE — H&P (Signed)
Glen Colon is an 68 y.o. male.   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  The patient reports difficulty breathing for several days.  He has oxygen at home and admits to wearing it for a few hours in the morning that would alleviate his dyspnea for the rest of the day.  However today the patient complained of some chest pain associated with his shortness of breath.  He also has a nonproductive cough and has noticed that his legs have been swelling.  Laboratory evaluation in the emergency department revealed mild elevation in troponin as well as elevated BNP.  Due to new onset symptoms of heart failure the emergency department staff call hospitalist service for admission.  Past Medical History:  Diagnosis Date  . Alcohol dependence ([place]) 10/13/2017  . Anemia   . Angina at rest [facility])    "take an aspirin & it goes right away" (10/26/2017)  . Anxiety   . Arthritis    "joints hurt; especially on cold days" (10/26/2017)  . Chronic bronchitis ([place])   . Cirrhosis ([place]) 10/13/2017  . COPD (chronic obstructive pulmonary disease) ([place])   . Epilepsy ([place]) 2018   "no sz if I take RX q night at bedtime" (10/26/2017)  . Hepatitis C, chronic ([place])    "tried tx in 2018; the RX gave me seizures; couldn't walk for 3 days; never finished the the tx" (10/26/2017)  . History of hiatal hernia   . Hypogonadism in male 10/18/2017  . Marijuana abuse 10/13/2017  . Pneumonia 1995  . Seizure ([place]) 2018   "related to hepatitis RX" (10/26/2017)  . Tobacco abuse   . Vitamin D deficiency 10/18/2017    Past Surgical History:  Procedure Laterality Date  . ESOPHAGOGASTRODUODENOSCOPY    . EXTERNAL FIXATION LEG Right 10/13/2017   Procedure: EXTERNAL FIXATION LEG;  Surgeon: Altamese Ronan, MD;  Location: [place];  Service: Orthopedics;   Laterality: Right;  . FRACTURE SURGERY    . I&D EXTREMITY Right 10/13/2017   Procedure: REPEAT IRRIGATION AND DEBRIDEMENT EXTREMITY WITH ADJUSTMENT OF EXTERNAL FIXITOR;  Surgeon: Altamese [place], MD;  Location: [place];  Service: Orthopedics;  Laterality: Right;  . INTRAMEDULLARY (IM) NAIL FIBULA Right 10/13/2017   Procedure: INTRAMEDULLARY (IM) NAIL FIBULA;  Surgeon: Altamese Rosewood, MD;  Location: [place];  Service: Orthopedics;  Laterality: Right;  . LIVER CYST REMOVAL    . [place], "walking pna"  . ORIF ANKLE FRACTURE Right 10/11/2017   Procedure: I & D right ankle with application of external fixator and wound vac;  Surgeon: Hessie Knows, MD;  Location: [facility];  Service: Orthopedics;  Laterality: Right;    History reviewed. No pertinent family history. Social History:  reports that he has been smoking. He has a 11.50 pack-year smoking history. He has quit using smokeless tobacco.  His smokeless tobacco use included chew. He reports current alcohol use of about 6.0 standard drinks of alcohol per week. He reports current drug use. Drugs: Marijuana and Cocaine.  Allergies:  Allergies  Allergen Reactions  . Codeine Nausea And Vomiting and Other (See Comments)    CHILLS & SWEATS "DEATHLY ILL" per Pt description    Medications Prior to Admission  Medication Sig Dispense Refill  . albuterol (PROVENTIL HFA;VENTOLIN HFA) 108 (90 Base) MCG/ACT inhaler Inhale 2 puffs  into the lungs every 6 (six) hours as needed for wheezing or shortness of breath.    . alprazolam (XANAX) 2 MG tablet Take 1 mg by mouth 2 (two) times daily.    Marland Kitchen aspirin 325 MG tablet Take 325 mg by mouth daily.    . Fluticasone-Salmeterol (ADVAIR) 250-50 MCG/DOSE AEPB Inhale 1 puff into the lungs 2 (two) times daily.    Marland Kitchen PHENObarbital (LUMINAL) 100 MG tablet Take 100 mg by mouth at bedtime.       Results for orders placed or performed during the hospital encounter of 12/15/18 (from the past 48 hour(s))   Basic metabolic panel     Status: Abnormal   Collection Time: 12/15/18  7:21 PM  Result Value Ref Range   Sodium 130 (L) 135 - 145 mmol/L   Potassium 3.5 3.5 - 5.1 mmol/L   Chloride 99 98 - 111 mmol/L   CO2 19 (L) 22 - 32 mmol/L   Glucose, Bld 104 (H) 70 - 99 mg/dL   BUN <5 (L) 8 - 23 mg/dL   Creatinine, Ser 0.54 (L) 0.61 - 1.24 mg/dL   Calcium 7.6 (L) 8.9 - 10.3 mg/dL   GFR calc non Af Amer >60 >60 mL/min   GFR calc Af Amer >60 >60 mL/min   Anion gap 12 5 - 15    Comment: Performed at [facility], [address] [place], [place] [address]  CBC     Status: Abnormal   Collection Time: 12/15/18  7:21 PM  Result Value Ref Range   WBC 5.2 4.0 - 10.5 K/uL   RBC 4.06 (L) 4.22 - 5.81 MIL/uL   Hemoglobin 15.1 13.0 - 17.0 g/dL   HCT 42.8 39.0 - 52.0 %   MCV 105.4 (H) 80.0 - 100.0 fL   MCH 37.2 (H) 26.0 - 34.0 pg   MCHC 35.3 30.0 - 36.0 g/dL   RDW 14.5 11.5 - 15.5 %   Platelets 51 (L) 150 - 400 K/uL    Comment: Immature Platelet Fraction may be clinically indicated, consider ordering this additional test [id number]    nRBC 0.0 0.0 - 0.2 %    Comment: Performed at [facility], [address] [place], [place] [address]  Troponin I - ONCE - STAT     Status: Abnormal   Collection Time: 12/15/18  7:21 PM  Result Value Ref Range   Troponin I 0.03 (HH) <0.03 ng/mL    Comment: CRITICAL RESULT CALLED TO, READ BACK BY AND VERIFIED WITH BECCA LYNN 12/15/18 @ 2017  [place] Performed at [facility], [address] [place], [place] [address]   Brain natriuretic peptide     Status: Abnormal   Collection Time: 12/15/18  7:21 PM  Result Value Ref Range   B Natriuretic Peptide 619.0 (H) 0.0 - 100.0 pg/mL    Comment: Performed at [facility], [address] [place], [place] [address]  CBC with Differential     Status: Abnormal   Collection Time: 12/15/18  7:21 PM  Result Value Ref Range   WBC 5.2 4.0 - 10.5 K/uL   RBC 4.05 (L) 4.22 - 5.81 MIL/uL    Hemoglobin 15.1 13.0 - 17.0 g/dL   HCT 42.7 39.0 - 52.0 %   MCV 105.4 (H) 80.0 - 100.0 fL   MCH 37.3 (H) 26.0 - 34.0 pg   MCHC 35.4 30.0 - 36.0 g/dL   RDW 14.4 11.5 - 15.5 %   Platelets 52 (L) 150 - 400  K/uL    Comment: Immature Platelet Fraction may be clinically indicated, consider ordering this additional test [id number]    nRBC 0.0 0.0 - 0.2 %   Neutrophils Relative % 56 %   Neutro Abs 2.9 1.7 - 7.7 K/uL   Lymphocytes Relative 30 %   Lymphs Abs 1.6 0.7 - 4.0 K/uL   Monocytes Relative 11 %   Monocytes Absolute 0.6 0.1 - 1.0 K/uL   Eosinophils Relative 1 %   Eosinophils Absolute 0.0 0.0 - 0.5 K/uL   Basophils Relative 1 %   Basophils Absolute 0.1 0.0 - 0.1 K/uL   Immature Granulocytes 1 %   Abs Immature Granulocytes 0.05 0.00 - 0.07 K/uL    Comment: Performed at [facility], [address] [place], [place] [address]  Hepatic function panel     Status: Abnormal   Collection Time: 12/15/18  7:21 PM  Result Value Ref Range   Total Protein 6.3 (L) 6.5 - 8.1 g/dL   Albumin 2.6 (L) 3.5 - 5.0 g/dL   AST 121 (H) 15 - 41 U/L   ALT 46 (H) 0 - 44 U/L   Alkaline Phosphatase 167 (H) 38 - 126 U/L   Total Bilirubin 4.0 (H) 0.3 - 1.2 mg/dL   Bilirubin, Direct 1.8 (H) 0.0 - 0.2 mg/dL   Indirect Bilirubin 2.2 (H) 0.3 - 0.9 mg/dL    Comment: Performed at [facility], [address] [place], [place] [address]  Troponin I - ONCE - STAT     Status: Abnormal   Collection Time: 12/15/18 10:14 PM  Result Value Ref Range   Troponin I 0.05 (HH) <0.03 ng/mL    Comment: CRITICAL VALUE NOTED. VALUE IS CONSISTENT WITH PREVIOUSLY REPORTED/CALLED VALUE.  TF[place] Performed at [facility], [place], [place] [address]   Troponin I - Now Then Q6H     Status: Abnormal   Collection Time: 12/16/18  4:25 AM  Result Value Ref Range   Troponin I 0.03 (HH) <0.03 ng/mL    Comment: CRITICAL VALUE NOTED. VALUE IS CONSISTENT WITH PREVIOUSLY REPORTED/CALLED VALUE.   TFK Performed at [facility], [address] [place], [place] [address]   TSH     Status: None   Collection Time: 12/16/18  4:25 AM  Result Value Ref Range   TSH 1.088 0.350 - 4.500 uIU/mL    Comment: Performed by a 3rd Generation assay with a functional sensitivity of <=0.01 uIU/mL. Performed at [facility], [address] [place], [place] [address]    Dg Chest 2 View  Result Date: 12/15/2018 CLINICAL DATA:  Chest pain for 1 week, unable to breathe, history COPD, on home oxygen as needed, smoker, cirrhosis, hepatitis EXAM: CHEST - 2 VIEW COMPARISON:  10/28/2017 FINDINGS: Enlargement of cardiac silhouette with pulmonary vascular congestion. Mediastinal contours normal. Minimal LEFT basilar atelectasis, improved. Emphysematous changes without infiltrate, pleural effusion or pneumothorax. Compression fractures of adjacent vertebra in the midthoracic spine unchanged since an earlier study of 01/11/2013. IMPRESSION: Enlargement of cardiac silhouette with pulmonary vascular congestion. COPD changes with minimal LEFT basilar atelectasis, improved since prior exam. Electronically Signed   By: Lavonia Dana M.D.   On: 12/15/2018 21:38   US Venous Img Lower Bilateral  Result Date: 12/16/2018 CLINICAL DATA:  BILATERAL lower extremity swelling for 1 month, history cirrhosis, COPD EXAM: BILATERAL LOWER EXTREMITY VENOUS DOPPLER ULTRASOUND TECHNIQUE: Gray-scale sonography with graded compression, as well as color Doppler and duplex ultrasound were performed to evaluate the  lower extremity deep venous systems from the level of the common femoral vein and including the common femoral, femoral, profunda femoral, popliteal and calf veins including the posterior tibial, peroneal and gastrocnemius veins when visible. The superficial great saphenous vein was also interrogated. Spectral Doppler was utilized to evaluate flow at rest and with distal augmentation maneuvers in the common femoral,  femoral and popliteal veins. COMPARISON:  None FINDINGS: RIGHT LOWER EXTREMITY Common Femoral Vein: No evidence of thrombus. Normal compressibility, respiratory phasicity and response to augmentation. Saphenofemoral Junction: No evidence of thrombus. Normal compressibility and flow on color Doppler imaging. Profunda Femoral Vein: No evidence of thrombus. Normal compressibility and flow on color Doppler imaging. Femoral Vein: No evidence of thrombus. Normal compressibility, respiratory phasicity and response to augmentation. Popliteal Vein: No evidence of thrombus. Normal compressibility, respiratory phasicity and response to augmentation. Calf Veins: No evidence of thrombus. Normal compressibility and flow on color Doppler imaging. Superficial Great Saphenous Vein: No evidence of thrombus. Normal compressibility. Venous Reflux:  None. Other Findings:  Scattered subcutaneous edema. LEFT LOWER EXTREMITY Common Femoral Vein: No evidence of thrombus. Normal compressibility, respiratory phasicity and response to augmentation. Saphenofemoral Junction: No evidence of thrombus. Normal compressibility and flow on color Doppler imaging. Profunda Femoral Vein: No evidence of thrombus. Normal compressibility and flow on color Doppler imaging. Femoral Vein: No evidence of thrombus. Normal compressibility, respiratory phasicity and response to augmentation. Popliteal Vein: No evidence of thrombus. Normal compressibility, respiratory phasicity and response to augmentation. Calf Veins: No evidence of thrombus. Normal compressibility and flow on color Doppler imaging. Superficial Great Saphenous Vein: No evidence of thrombus. Normal compressibility. Venous Reflux:  None. Other Findings: Small Baker cyst 1.3 x 1.7 x 4.7 cm. Scattered subcutaneous edema. IMPRESSION: No evidence of deep venous thrombosis in either lower extremity. Small Baker's cyst LEFT popliteal fossa. Electronically Signed   By: Lavonia Dana M.D.   On: 12/16/2018  01:49    Review of Systems  Constitutional: Negative for chills and fever.  HENT: Negative for sore throat and tinnitus.   Eyes: Negative for blurred vision and redness.  Respiratory: Positive for cough and shortness of breath.   Cardiovascular: Positive for leg swelling. Negative for chest pain, palpitations, orthopnea and PND.  Gastrointestinal: Negative for abdominal pain, diarrhea, nausea and vomiting.  Genitourinary: Negative for dysuria, frequency and urgency.  Musculoskeletal: Negative for joint pain and myalgias.  Skin: Negative for rash.       No lesions  Neurological: Negative for speech change, focal weakness and weakness.  Endo/Heme/Allergies: Does not bruise/bleed easily.       No temperature intolerance  Psychiatric/Behavioral: Negative for depression and suicidal ideas.    Blood pressure 118/75, pulse 93, temperature 98.9 F (37.2 C), temperature source Oral, resp. rate 20, height 5\' 10"  (1.778 m), weight 102.6 kg, SpO2 94 %. Physical Exam  Vitals reviewed. Constitutional: He appears well-developed and well-nourished. No distress.  HENT:  Head: Normocephalic and atraumatic.  Mouth/Throat: Oropharynx is clear and moist.  Eyes: Pupils are equal, round, and reactive to light. Conjunctivae and EOM are normal. No scleral icterus.  Neck: Normal range of motion. Neck supple. No JVD present. No tracheal deviation present. No thyromegaly present.  Cardiovascular: Normal rate and regular rhythm. Exam reveals no gallop and no friction rub.  No murmur heard. Respiratory: Effort normal and breath sounds normal. No respiratory distress.  GI: Soft. Bowel sounds are normal. He exhibits no distension. There is no abdominal tenderness.  Genitourinary:    Genitourinary Comments: Deferred  Musculoskeletal: Normal range of motion.        General: Edema present.  Lymphadenopathy:    He has no cervical adenopathy.  Neurological: He is alert. No cranial nerve deficit.  Skin: Skin is  warm and dry. No rash noted. No erythema.  Psychiatric: He has a normal mood and affect. His behavior is normal. Judgment and thought content normal.     Assessment/Plan This is a 68 year old male admitted for new onset heart failure. 1.  CHF: Acute; diastolic.  Likely secondary to cirrhosis of the liver.  Continue Lasix until euvolemic.  Obtain echocardiogram.    Consult cardiology. 2.  Elevated troponin: Likely secondary to demand ischemia.  Follow cardiac biomarkers.  Continue aspirin 3.  COPD: Stable; continue inhaled corticosteroid.  Albuterol as needed. 4.  Seizure disorder: Stable; continue phenobarbital 5.  DVT prophylaxis: SCDs due to thrombocytopenia 6.  GI prophylaxis: None The patient is a full code.  Time spent on admission orders and patient care approximately 45 minutes  Harrie Foreman, MD 12/16/2018, 6:37 AM

## 2018-12-16 NOTE — Progress Notes (Signed)
*  PRELIMINARY RESULTS* Echocardiogram 2D Echocardiogram has been performed.  Glen Colon Glen Colon 12/16/2018, 9:19 AM

## 2018-12-17 LAB — BASIC METABOLIC PANEL
Anion gap: 8 (ref 5–15)
BUN: 10 mg/dL (ref 8–23)
CO2: 29 mmol/L (ref 22–32)
Calcium: 7.6 mg/dL — ABNORMAL LOW (ref 8.9–10.3)
Chloride: 98 mmol/L (ref 98–111)
Creatinine, Ser: 0.67 mg/dL (ref 0.61–1.24)
GFR calc Af Amer: >60 mL/min (ref >60)
Glucose, Bld: 95 mg/dL (ref 70–99)
Potassium: 2.8 mmol/L — ABNORMAL LOW (ref 3.5–5.1)
Sodium: 135 mmol/L (ref 135–145)

## 2018-12-17 LAB — MAGNESIUM
MAGNESIUM: 1.5 mg/dL — AB (ref 1.7–2.4)
Magnesium: 1.5 mg/dL — ABNORMAL LOW (ref 1.7–2.4)

## 2018-12-17 LAB — CBC
HCT: 38 % — ABNORMAL LOW (ref 39.0–52.0)
Hemoglobin: 13.3 g/dL (ref 13.0–17.0)
MCH: 37.6 pg — ABNORMAL HIGH (ref 26.0–34.0)
MCHC: 35 g/dL (ref 30.0–36.0)
MCV: 107.3 fL — ABNORMAL HIGH (ref 80.0–100.0)
PLATELETS: 34 10*3/uL — AB (ref 150–400)
RBC: 3.54 MIL/uL — ABNORMAL LOW (ref 4.22–5.81)
RDW: 14.5 % (ref 11.5–15.5)
WBC: 2.9 10*3/uL — ABNORMAL LOW (ref 4.0–10.5)
nRBC: 0 % (ref 0.0–0.2)

## 2018-12-17 LAB — POTASSIUM: Potassium: 3.3 mmol/L — ABNORMAL LOW (ref 3.5–5.1)

## 2018-12-17 MED ORDER — POTASSIUM CHLORIDE CRYS ER 20 MEQ PO TBCR
40.0000 meq | EXTENDED_RELEASE_TABLET | ORAL | Status: AC
  Administered 2018-12-17 (×2): 40 meq via ORAL
  Filled 2018-12-17 (×2): qty 2

## 2018-12-17 MED ORDER — FUROSEMIDE 10 MG/ML IJ SOLN
40.0000 mg | Freq: Two times a day (BID) | INTRAMUSCULAR | Status: DC
  Administered 2018-12-17 – 2018-12-18 (×3): 40 mg via INTRAVENOUS
  Filled 2018-12-17 (×3): qty 4

## 2018-12-17 MED ORDER — MAGNESIUM OXIDE 400 (241.3 MG) MG PO TABS
400.0000 mg | ORAL_TABLET | Freq: Two times a day (BID) | ORAL | Status: DC
  Administered 2018-12-17 (×2): 400 mg via ORAL
  Filled 2018-12-17: qty 1

## 2018-12-17 MED ORDER — MAGNESIUM OXIDE 400 (241.3 MG) MG PO TABS
800.0000 mg | ORAL_TABLET | Freq: Once | ORAL | Status: DC
  Filled 2018-12-17: qty 2

## 2018-12-17 NOTE — Progress Notes (Signed)
[place] [facility] [place] [name]    MR#:  [id number]  DATE OF BIRTH:  May 04, 1951  SUBJECTIVE:  CHIEF COMPLAINT:   Chief Complaint  Patient presents with  . Chest Pain   No new complaint this morning.  Shortness of breath gradually improving.  No fevers.  REVIEW OF SYSTEMS:  Review of Systems  Constitutional: Negative for chills and fever.  HENT: Negative for ear pain, hearing loss and tinnitus.   Eyes: Negative for blurred vision and double vision.  Respiratory: Negative for cough, hemoptysis and shortness of breath.   Cardiovascular: Positive for leg swelling. Negative for chest pain.  Gastrointestinal: Negative for heartburn and nausea.  Genitourinary: Negative for dysuria and frequency.  Musculoskeletal: Negative for myalgias and neck pain.  Skin: Negative for itching and rash.  Neurological: Negative for dizziness and headaches.  Psychiatric/Behavioral: Negative for depression and hallucinations.    DRUG ALLERGIES:   Allergies  Allergen Reactions  . Codeine Nausea And Vomiting and Other (See Comments)    CHILLS & SWEATS "DEATHLY ILL" per Pt description   VITALS:  Blood pressure 111/72, pulse 76, temperature 98.7 F (37.1 C), temperature source Oral, resp. rate 18, height 5\' 10"  (1.778 m), weight 102.3 kg, SpO2 97 %. PHYSICAL EXAMINATION:   Physical Exam  Constitutional: He is oriented to person, place, and time. He appears well-developed and well-nourished.  HENT:  Head: Normocephalic and atraumatic.  Right Ear: External ear normal.  Eyes: Pupils are equal, round, and reactive to light. Conjunctivae are normal. Right eye exhibits no discharge.  Neck: Normal range of motion. Neck supple. No thyromegaly present.  Cardiovascular: Normal rate, regular rhythm and normal heart sounds.  Respiratory: Effort normal. He has rales.  GI: Soft. Bowel sounds are normal. He exhibits no distension. There is no abdominal  tenderness.  Musculoskeletal: Normal range of motion.        General: Edema present. No tenderness.  Neurological: He is alert and oriented to person, place, and time. No cranial nerve deficit.  Skin: Skin is warm. He is not diaphoretic. No erythema.  Psychiatric: He has a normal mood and affect. His behavior is normal.   LABORATORY PANEL:  Male CBC Recent Labs  Lab 12/17/18 0456  WBC 2.9*  HGB 13.3  HCT 38.0*  PLT 34*   ------------------------------------------------------------------------------------------------------------------ Chemistries  Recent Labs  Lab 12/15/18 1921 12/17/18 0456  NA 130* 135  K 3.5 2.8*  CL 99 98  CO2 19* 29  GLUCOSE 104* 95  BUN <5* 10  CREATININE 0.54* 0.67  CALCIUM 7.6* 7.6*  MG  --  1.5*  AST 121*  --   ALT 46*  --   ALKPHOS 167*  --   BILITOT 4.0*  --    RADIOLOGY:  No results found. ASSESSMENT AND PLAN:   This is a 68 year old male admitted for new onset heart failure.  1.  Acute diastolic CHF exacerbation Being diuresed with IV Lasix.  Started metoprolol XL 25 mg p.o. daily 2D echocardiogram done with ejection fraction of 60 to 65%. Likely underlying hepatorenal syndrome given history of liver cirrhosis. Follow-up cardiology input. Monitor clinically  2.  Elevated troponin: Likely secondary to demand ischemia. Troponin stable at 0.03. Continue aspirin and beta-blockers.  Follow-up on cardiology input  3.  COPD: Stable; continue inhaled corticosteroid.  Albuterol as needed.  4.  Seizure disorder: Stable; continue phenobarbital  5.  History of liver cirrhosis secondary to hepatitis C Follow-up with  gastroenterology service  6.  Thrombocytopenia with platelet count of 34 Secondary to liver cirrhosis.  No evidence of bleeding.  Follow-up platelet count in a.m.  7.  Hypokalemia and hypomagnesemia Being replaced.  Follow-up for repeat levels   DVT prophylaxis: SCDs due to thrombocytopenia   All the records are  reviewed and case discussed with Care Management/Social Worker. Management plans discussed with the patient, family and they are in agreement.  CODE STATUS: Full Code  TOTAL TIME TAKING CARE OF THIS PATIENT: 35 minutes.   More than 50% of the time was spent in counseling/coordination of care: YES  POSSIBLE D/C IN 2 DAYS, DEPENDING ON CLINICAL CONDITION.   Yazmeen Woolf M.D on 12/17/2018 at 1:16 PM  Between 7am to 6pm - Pager - [phone]  After 6pm go to [email or web address] - Proofreader  [facility]  Office  [phone]  CC: Primary care physician; Alvester Chou, NP  Note: This dictation was prepared with Dragon dictation along with smaller phrase technology. Any transcriptional errors that result from this process are unintentional.

## 2018-12-17 NOTE — Plan of Care (Signed)
Nutrition Education Note  RD consulted for nutrition education regarding new onset CHF.  68 year old male with PMHx of hepatitis C, EtOH dependence, anemia, anxiety, arthritis, COPD, cirrhosis, hx hiatal hernia, vitamin D deficiency admitted with acute diastolic CHF (LV EF 61-68% on 3/21).  Met with patient at bedside. He reports he has a good appetite and intake at baseline. He had decreased appetite for a few days PTA in setting of build up of fluid. He feels better now and is back to eating 100% of meals. Patient reports he has learned previously about a low sodium diet in setting of his cirrhosis. He does not use salt with cooking or at the table. He had not previously known that processed foods were high in salt until he watched the video on heart failure yesterday. He is planning on reducing his intake of these foods and cooking more from home. He reports his sister is a retired Marine scientist and is going to be giving him a Estate agent so he can weigh himself every morning.  RD provided "Low Sodium Nutrition Therapy" handout from the Academy of [employer] and Dietetics. Reviewed patient's dietary recall. Provided examples on ways to decrease sodium intake in diet. Discouraged intake of processed foods and use of salt shaker. Encouraged fresh fruits and vegetables as well as whole grain sources of carbohydrates to maximize fiber intake.   RD discussed why it is important for patient to adhere to diet recommendations, and emphasized the role of fluids, foods to avoid, and importance of weighing self daily. Teach back method used.  Expect fair to good compliance.  Body mass index is 32.36 kg/m. Pt meets criteria for obesity class I based on current BMI.  Current diet order is heart healthy, patient is consuming approximately 100% of meals at this time. Labs and medications reviewed. No further nutrition interventions warranted at this time. RD contact information provided. If additional nutrition issues  arise, please re-consult RD.   Willey Blade, MS, [place], LDN Office: [phone] Pager: [phone] After Hours/Weekend Pager: [phone]

## 2018-12-18 LAB — BASIC METABOLIC PANEL
Anion gap: 6 (ref 5–15)
BUN: 13 mg/dL (ref 8–23)
CO2: 30 mmol/L (ref 22–32)
Calcium: 7.6 mg/dL — ABNORMAL LOW (ref 8.9–10.3)
Chloride: 98 mmol/L (ref 98–111)
Creatinine, Ser: 0.74 mg/dL (ref 0.61–1.24)
GFR calc non Af Amer: >60 mL/min (ref >60)
Glucose, Bld: 102 mg/dL — ABNORMAL HIGH (ref 70–99)
Potassium: 3 mmol/L — ABNORMAL LOW (ref 3.5–5.1)
Sodium: 134 mmol/L — ABNORMAL LOW (ref 135–145)

## 2018-12-18 LAB — CBC
HCT: 39.7 % (ref 39.0–52.0)
Hemoglobin: 13.6 g/dL (ref 13.0–17.0)
MCH: 37.3 pg — ABNORMAL HIGH (ref 26.0–34.0)
MCHC: 34.3 g/dL (ref 30.0–36.0)
MCV: 108.8 fL — ABNORMAL HIGH (ref 80.0–100.0)
NRBC: 0 % (ref 0.0–0.2)
Platelets: 35 10*3/uL — ABNORMAL LOW (ref 150–400)
RBC: 3.65 MIL/uL — ABNORMAL LOW (ref 4.22–5.81)
RDW: 14.3 % (ref 11.5–15.5)
WBC: 3.2 10*3/uL — ABNORMAL LOW (ref 4.0–10.5)

## 2018-12-18 LAB — HIV ANTIBODY (ROUTINE TESTING W REFLEX): HIV Screen 4th Generation wRfx: NONREACTIVE

## 2018-12-18 LAB — MAGNESIUM: Magnesium: 1.8 mg/dL (ref 1.7–2.4)

## 2018-12-18 MED ORDER — METOPROLOL SUCCINATE ER 25 MG PO TB24: 12.5000 mg | ORAL_TABLET | Freq: Every day | ORAL | 0 refills | Status: DC

## 2018-12-18 MED ORDER — POTASSIUM CHLORIDE CRYS ER 20 MEQ PO TBCR
40.0000 meq | EXTENDED_RELEASE_TABLET | Freq: Once | ORAL | Status: AC
  Administered 2018-12-18: 40 meq via ORAL
  Filled 2018-12-18: qty 2

## 2018-12-18 MED ORDER — GUAIFENESIN-DM 100-10 MG/5ML PO SYRP
5.0000 mL | ORAL_SOLUTION | ORAL | Status: DC | PRN
  Filled 2018-12-18: qty 5

## 2018-12-18 MED ORDER — POTASSIUM CHLORIDE ER 10 MEQ PO TBCR: 20.0000 meq | EXTENDED_RELEASE_TABLET | Freq: Every day | ORAL | 0 refills | Status: DC

## 2018-12-18 MED ORDER — FUROSEMIDE 40 MG PO TABS: 40.0000 mg | ORAL_TABLET | Freq: Two times a day (BID) | ORAL | 0 refills | Status: DC

## 2018-12-18 MED ORDER — GUAIFENESIN-DM 100-10 MG/5ML PO SYRP: 5.0000 mL | ORAL_SOLUTION | Freq: Four times a day (QID) | ORAL | 0 refills | Status: AC | PRN

## 2018-12-18 MED ORDER — POTASSIUM CHLORIDE CRYS ER 20 MEQ PO TBCR
40.0000 meq | EXTENDED_RELEASE_TABLET | Freq: Every day | ORAL | Status: DC
  Administered 2018-12-18: 40 meq via ORAL
  Filled 2018-12-18: qty 2

## 2018-12-18 NOTE — Discharge Summary (Signed)
[facility] [name]    MR#:  [id number]  DATE OF BIRTH:  1950/11/26  DATE OF ADMISSION:  12/15/2018   ADMITTING PHYSICIAN: Harrie Foreman, MD  DATE OF DISCHARGE: 12/18/2018  PRIMARY CARE PHYSICIAN: Alvester Chou, NP   ADMISSION DIAGNOSIS:  Hypoxia [R09.02] Elevated troponin I level [R79.89] Acute congestive heart failure, unspecified heart failure type ([place]) [I50.9] DISCHARGE DIAGNOSIS:  Active Problems:   Cirrhosis ([place])   Thrombocytopenia ([facility])   Acute systolic CHF (congestive heart failure) ([place])   New onset of congestive heart failure ([place])  SECONDARY DIAGNOSIS:   Past Medical History:  Diagnosis Date   Alcohol dependence ([place]) 10/13/2017   Anemia    Angina at rest [facility])    "take an aspirin & it goes right away" (10/26/2017)   Anxiety    Arthritis    "joints hurt; especially on cold days" (10/26/2017)   Chronic bronchitis ([place])    Cirrhosis ([place]) 10/13/2017   COPD (chronic obstructive pulmonary disease) ([place])    Epilepsy ([place]) 2018   "no sz if I take RX q night at bedtime" (10/26/2017)   Hepatitis C, chronic ([place])    "tried tx in 2018; the RX gave me seizures; couldn't walk for 3 days; never finished the the tx" (10/26/2017)   History of hiatal hernia    Hypogonadism in male 10/18/2017   Marijuana abuse 10/13/2017   Pneumonia 1995   Seizure ([place]) 2018   "related to hepatitis RX" (10/26/2017)   Tobacco abuse    Vitamin D deficiency 10/18/2017   HOSPITAL COURSE:  Chief complaint; Shortness of breath  HPI:  Patient is a 68 year old with past medical history of 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  The patient reported difficulty breathing for several days.  Patient also complained of some chest pain associated with his shortness of breath.  He also has a nonproductive cough  and has noticed that his legs have been swelling.  Laboratory evaluation in the emergency department revealed mild elevation in troponin as well as elevated BNP.  Due to new onset symptoms of heart failure the emergency department staff call hospitalist service for admission.  Please refer to the H&P dictated for further details.   Hospital course; 1.  Acute diastolic CHF exacerbation; newly diagnosed Patient adequately diuresed with IV Lasix.  Transitioned to p.o. Lasix on discharge with potassium supplementation.  Started on metoprolol XL.  Prescription given for 12.5 mg p.o. daily. 2D echocardiogram done with ejection fraction of 60 to 65%. Likely underlying hepatorenal syndrome given history of liver cirrhosis.  Patient significantly improved clinically.  Requesting for discharge as soon as possible this morning.  States he is not able to stay in the edition of the.  Back to baseline.  Follow-up with primary care physician on heart failure clinic on discharge  2. Elevated troponin: Likely secondary to demand ischemia. Troponin stable at 0.03.Continue aspirin and beta-blockers.  No indication for any further cardiac work-up at this time.  Patient remains completely asymptomatic.  3. COPD: Stable; continue inhaled corticosteroid. Albuterol as needed.  4. Seizure disorder: Stable; continue phenobarbital  5.  History of liver cirrhosis secondary to hepatitis C Follow-up with gastroenterology service  6.  Thrombocytopenia Platelet count remains fairly stable at 35,000.  This is secondary to liver cirrhosis.  No evidence of bleeding.   7.  Hypokalemia  and hypomagnesemia Replaced prior to discharge  DISCHARGE CONDITIONS:  Clinically and hemodynamically stable for discharge. CONSULTS OBTAINED:   DRUG ALLERGIES:   Allergies  Allergen Reactions   Codeine Nausea And Vomiting and Other (See Comments)    CHILLS & SWEATS "DEATHLY ILL" per Pt description   DISCHARGE MEDICATIONS:    Allergies as of 12/18/2018      Reactions   Codeine Nausea And Vomiting, Other (See Comments)   CHILLS & SWEATS "DEATHLY ILL" per Pt description      Medication List    TAKE these medications   albuterol 108 (90 Base) MCG/ACT inhaler Commonly known as:  PROVENTIL HFA;VENTOLIN HFA Inhale 2 puffs into the lungs every 6 (six) hours as needed for wheezing or shortness of breath.   alprazolam 2 MG tablet Commonly known as:  XANAX Take 1 mg by mouth 2 (two) times daily.   aspirin 325 MG tablet Take 325 mg by mouth daily.   Fluticasone-Salmeterol 250-50 MCG/DOSE Aepb Commonly known as:  ADVAIR Inhale 1 puff into the lungs 2 (two) times daily.   furosemide 40 MG tablet Commonly known as:  Lasix Take 1 tablet (40 mg total) by mouth 2 (two) times daily.   guaiFENesin-dextromethorphan 100-10 MG/5ML syrup Commonly known as:  ROBITUSSIN DM Take 5 mLs by mouth every 6 (six) hours as needed for up to 5 days for cough.   metoprolol succinate 25 MG 24 hr tablet Commonly known as:  TOPROL-XL Take 0.5 tablets (12.5 mg total) by mouth daily. Start taking on:  December 19, 2018   PHENObarbital 100 MG tablet Commonly known as:  LUMINAL Take 100 mg by mouth at bedtime.   potassium chloride 10 MEQ tablet Commonly known as:  K-DUR Take 2 tablets (20 mEq total) by mouth daily for 30 days.        DISCHARGE INSTRUCTIONS:   DIET:  Cardiac diet DISCHARGE CONDITION:  Stable ACTIVITY:  Activity as tolerated OXYGEN:  Home Oxygen: Yes.    Oxygen Delivery: Via nasal cannula DISCHARGE LOCATION:  home   If you experience worsening of your admission symptoms, develop shortness of breath, life threatening emergency, suicidal or homicidal thoughts you must seek medical attention immediately by calling 911 or calling your MD immediately  if symptoms less severe.  You Must read complete instructions/literature along with all the possible adverse reactions/side effects for all the Medicines  you take and that have been prescribed to you. Take any new Medicines after you have completely understood and accpet all the possible adverse reactions/side effects.   Please note  You were cared for by a hospitalist during your hospital stay. If you have any questions about your discharge medications or the care you received while you were in the hospital after you are discharged, you can call the unit and asked to speak with the hospitalist on call if the hospitalist that took care of you is not available. Once you are discharged, your primary care physician will handle any further medical issues. Please note that NO REFILLS for any discharge medications will be authorized once you are discharged, as it is imperative that you return to your primary care physician (or establish a relationship with a primary care physician if you do not have one) for your aftercare needs so that they can reassess your need for medications and monitor your lab values.    On the day of Discharge:  VITAL SIGNS:  Blood pressure (!) 107/59, pulse 71, temperature 98.7 F (37.1 C), temperature source  Oral, resp. rate 18, height 5\' 10"  (1.778 m), weight 98.5 kg, SpO2 90 %. PHYSICAL EXAMINATION:  GENERAL:  68 y.o.-year-old patient lying in the bed with no acute distress.  EYES: Pupils equal, round, reactive to light and accommodation. No scleral icterus. Extraocular muscles intact.  HEENT: Head atraumatic, normocephalic. Oropharynx and nasopharynx clear.  NECK:  Supple, no jugular venous distention. No thyroid enlargement, no tenderness.  LUNGS: Normal breath sounds bilaterally, no wheezing, rales,rhonchi or crepitation. No use of accessory muscles of respiration.  CARDIOVASCULAR: S1, S2 normal. No murmurs, rubs, or gallops.  ABDOMEN: Soft, non-tender, non-distended. Bowel sounds present. No organomegaly or mass.  EXTREMITIES: No pedal edema, cyanosis, or clubbing.  NEUROLOGIC: Cranial nerves II through XII are intact.  Muscle strength 5/5 in all extremities. Sensation intact. Gait not checked.  PSYCHIATRIC: The patient is alert and oriented x 3.  SKIN: No obvious rash, lesion, or ulcer.  DATA REVIEW:   CBC Recent Labs  Lab 12/18/18 0441  WBC 3.2*  HGB 13.6  HCT 39.7  PLT 35*    Chemistries  Recent Labs  Lab 12/15/18 1921  12/18/18 0441  NA 130*   < > 134*  K 3.5   < > 3.0*  CL 99   < > 98  CO2 19*   < > 30  GLUCOSE 104*   < > 102*  BUN <5*   < > 13  CREATININE 0.54*   < > 0.74  CALCIUM 7.6*   < > 7.6*  MG  --    < > 1.8  AST 121*  --   --   ALT 46*  --   --   ALKPHOS 167*  --   --   BILITOT 4.0*  --   --    < > = values in this interval not displayed.     Microbiology Results  Results for orders placed or performed during the hospital encounter of 10/12/17  Surgical pcr screen     Status: None   Collection Time: 10/13/17  4:24 AM  Result Value Ref Range Status   MRSA, PCR NEGATIVE NEGATIVE Final   Staphylococcus aureus NEGATIVE NEGATIVE Final    Comment: (NOTE) The Xpert SA Assay (FDA approved for NASAL specimens in patients 92 years of age and older), is one component of a comprehensive surveillance program. It is not intended to diagnose infection nor to guide or monitor treatment.     RADIOLOGY:  No results found.   Management plans discussed with the patient, family and they are in agreement.  CODE STATUS: Full Code   TOTAL TIME TAKING CARE OF THIS PATIENT: 36 minutes.    Bryant Saye M.D on 12/18/2018 at 10:58 AM  Between 7am to 6pm - Pager - [phone]  After 6pm go to [email or web address] - Proofreader  [facility]  Hospitalists  Office  [phone]  CC: Primary care physician; Alvester Chou, NP   Note: This dictation was prepared with Dragon dictation along with smaller phrase technology. Any transcriptional errors that result from this process are unintentional.

## 2018-12-18 NOTE — Progress Notes (Signed)
Nutrition Brief Note   RD received consult for CHF diet education. Pt previously educated this admit (see note from 3/22 ). If pt needs further diet education, consider referral to Nutrition and Diabetes Education Services at [facility]. Phone # [phone].  Koleen Distance MS, RD, LDN Pager #- [phone] Office#- [phone] After Hours Pager: [phone]

## 2018-12-18 NOTE — Progress Notes (Addendum)
[place] Sing to be D/C'd Home per MD order.  Discussed prescriptions and follow up appointments with the patient. Prescriptions given to patient, medication list explained in detail. Pt verbalized understanding. Home Meds pickup  from pharmacy and returned to pt.   Allergies as of 12/18/2018      Reactions   Codeine Nausea And Vomiting, Other (See Comments)   CHILLS & SWEATS "DEATHLY ILL" per Pt description      Medication List    TAKE these medications   albuterol 108 (90 Base) MCG/ACT inhaler Commonly known as:  PROVENTIL HFA;VENTOLIN HFA Inhale 2 puffs into the lungs every 6 (six) hours as needed for wheezing or shortness of breath.   alprazolam 2 MG tablet Commonly known as:  XANAX Take 1 mg by mouth 2 (two) times daily.   aspirin 325 MG tablet Take 325 mg by mouth daily.   Fluticasone-Salmeterol 250-50 MCG/DOSE Aepb Commonly known as:  ADVAIR Inhale 1 puff into the lungs 2 (two) times daily.   furosemide 40 MG tablet Commonly known as:  Lasix Take 1 tablet (40 mg total) by mouth 2 (two) times daily.   guaiFENesin-dextromethorphan 100-10 MG/5ML syrup Commonly known as:  ROBITUSSIN DM Take 5 mLs by mouth every 6 (six) hours as needed for up to 5 days for cough.   metoprolol succinate 25 MG 24 hr tablet Commonly known as:  TOPROL-XL Take 0.5 tablets (12.5 mg total) by mouth daily. Start taking on:  December 19, 2018   PHENObarbital 100 MG tablet Commonly known as:  LUMINAL Take 100 mg by mouth at bedtime.   potassium chloride 10 MEQ tablet Commonly known as:  K-DUR Take 2 tablets (20 mEq total) by mouth daily for 30 days.       Vitals:   12/18/18 0448 12/18/18 0733  BP: 104/70 (!) 107/59  Pulse: 73 71  Resp: 18   Temp: 97.7 F (36.5 C) 98.7 F (37.1 C)  SpO2: 91% 90%    Tele box removed and returned.Skin clean, dry and intact without evidence of skin break down, no evidence of skin tears noted. IV catheter discontinued intact. Site without signs and  symptoms of complications. Dressing and pressure applied. Pt denies pain at this time. No complaints noted.  An After Visit Summary was printed and given to the patient. Patient escorted via [place], and D/C home via private auto.  Rolley Sims

## 2018-12-22 ENCOUNTER — Telehealth: Payer: Self-pay

## 2018-12-22 NOTE — Telephone Encounter (Signed)
TELEPHONE CALL NOTE  Glen Colon has been deemed a candidate for a follow-up tele-health visit to limit community exposure during the Covid-19 pandemic. I spoke with the patient via phone to ensure availability of phone/video source, confirm preferred email & phone number, discuss instructions and expectations, and review consent.   I reminded Glen Colon to be prepared with any vital sign and/or heart rhythm information that could potentially be obtained via home monitoring, at the time of his visit.  Finally, I reminded Glen Colon to expect an e-mail containing a link for their video-based visit approximately 15 minutes before his visit, or alternatively, a phone call at the time of his visit if his visit is planned to be a phone encounter.  Did the patient verbally consent to treatment as below? YES  Kaytee Taliercio L, CMA 12/22/2018 12:12 PM  CONSENT FOR TELE-HEALTH VISIT - PLEASE REVIEW  I hereby voluntarily request, consent and authorize [facility] and its employed or contracted physicians, physician assistants, nurse practitioners or other licensed health care professionals (the Practitioner), to provide me with telemedicine health care services (the "Services") as deemed necessary by the treating Practitioner. I acknowledge and consent to receive the Services by the Practitioner via telemedicine. I understand that the telemedicine visit will involve communicating with the Practitioner through telephonic communication technology and the disclosure of certain medical information by electronic transmission. I acknowledge that I have been given the opportunity to request an in-person assessment or other available alternative prior to the telemedicine visit and am voluntarily participating in the telemedicine visit.  I understand that I have the right to withhold or withdraw my consent to the use of telemedicine in the course of my care at any time, without affecting my right  to future care or treatment, and that the Practitioner or I may terminate the telemedicine visit at any time. I understand that I have the right to inspect all information obtained and/or recorded in the course of the telemedicine visit and may receive copies of available information for a reasonable fee.  I understand that some of the potential risks of receiving the Services via telemedicine include:  Marland Kitchen Delay or interruption in medical evaluation due to technological equipment failure or disruption; . Information transmitted may not be sufficient (e.g. poor resolution of images) to allow for appropriate medical decision making by the Practitioner; and/or  . In rare instances, security protocols could fail, causing a breach of personal health information.  Furthermore, I acknowledge that it is my responsibility to provide information about my medical history, conditions and care that is complete and accurate to the best of my ability. I acknowledge that Practitioner's advice, recommendations, and/or decision may be based on factors not within their control, such as incomplete or inaccurate data provided by me or lack of visual representation. I understand that the practice of medicine is not an exact science and that Practitioner makes no warranties or guarantees regarding treatment outcomes. I acknowledge that I will receive a copy of this consent concurrently upon execution via email to the email address I last provided but may also request a printed copy by calling the office of [facility].    I understand that my insurance may be billed for this visit.   I have read or had this consent read to me. . I understand the contents of this consent, which adequately explains the benefits and risks of the Services being provided via telemedicine.  Marland Kitchen  I have been provided ample opportunity to ask questions regarding this consent and the Services and have had my questions answered to my  satisfaction. . I give my informed consent for the services to be provided through the use of telemedicine in my medical care  By participating in this telemedicine visit I agree to the above.

## 2018-12-22 NOTE — Telephone Encounter (Signed)
   TELEPHONE CALL NOTE  This patient has been deemed a candidate for follow-up tele-health visit to limit community exposure during the Covid-19 pandemic. I spoke with the patient via phone to discuss instructions. The patient was advised to review the section on consent for treatment as well. The patient will receive a phone call 2-3 days prior to their E-Visit at which time consent will be verbally confirmed. A Virtual Office Visit appointment type has been scheduled for 12/25/2018 at 11:40 am with Darylene Price FNP.  Vonda Antigua L, CMA 12/22/2018 12:12 PM

## 2018-12-25 ENCOUNTER — Encounter: Payer: Self-pay | Admitting: Family

## 2018-12-25 ENCOUNTER — Other Ambulatory Visit: Payer: Self-pay

## 2018-12-25 ENCOUNTER — Ambulatory Visit: Payer: Medicare Other | Attending: Family | Admitting: Family

## 2018-12-25 VITALS — HR 63

## 2018-12-25 DIAGNOSIS — I890 Lymphedema, not elsewhere classified: Secondary | ICD-10-CM

## 2018-12-25 DIAGNOSIS — K7030 Alcoholic cirrhosis of liver without ascites: Secondary | ICD-10-CM

## 2018-12-25 DIAGNOSIS — I5032 Chronic diastolic (congestive) heart failure: Secondary | ICD-10-CM

## 2018-12-25 NOTE — Patient Instructions (Addendum)
Begin weighing daily and call for an overnight weight gain of > 2 pounds or a weekly weight gain of >5 pounds.   Low-Sodium Eating Plan Sodium, which is an element that makes up salt, helps you maintain a healthy balance of fluids in your body. Too much sodium can increase your blood pressure and cause fluid and waste to be held in your body. Your health care provider or dietitian may recommend following this plan if you have high blood pressure (hypertension), kidney disease, liver disease, or heart failure. Eating less sodium can help lower your blood pressure, reduce swelling, and protect your heart, liver, and kidneys. What are tips for following this plan? General guidelines  Most people on this plan should limit their sodium intake to 2,000 mg (milligrams) of sodium each day. Reading food labels   The Nutrition Facts label lists the amount of sodium in one serving of the food. If you eat more than one serving, you must multiply the listed amount of sodium by the number of servings.  Choose foods with less than 140 mg of sodium per serving.  Avoid foods with 300 mg of sodium or more per serving. Shopping  Look for lower-sodium products, often labeled as "low-sodium" or "no salt added."  Always check the sodium content even if foods are labeled as "unsalted" or "no salt added".  Buy fresh foods. ? Avoid canned foods and premade or frozen meals. ? Avoid canned, cured, or processed meats  Buy breads that have less than 80 mg of sodium per slice. Cooking  Eat more home-cooked food and less restaurant, buffet, and fast food.  Avoid adding salt when cooking. Use salt-free seasonings or herbs instead of table salt or sea salt. Check with your health care provider or pharmacist before using salt substitutes.  Cook with plant-based oils, such as canola, sunflower, or olive oil. Meal planning  When eating at a restaurant, ask that your food be prepared with less salt or no salt, if  possible.  Avoid foods that contain MSG (monosodium glutamate). MSG is sometimes added to Chinese food, bouillon, and some canned foods. What foods are recommended? The items listed may not be a complete list. Talk with your dietitian about what dietary choices are best for you. Grains Low-sodium cereals, including oats, puffed wheat and rice, and shredded wheat. Low-sodium crackers. Unsalted rice. Unsalted pasta. Low-sodium bread. Whole-grain breads and whole-grain pasta. Vegetables Fresh or frozen vegetables. "No salt added" canned vegetables. "No salt added" tomato sauce and paste. Low-sodium or reduced-sodium tomato and vegetable juice. Fruits Fresh, frozen, or canned fruit. Fruit juice. Meats and other protein foods Fresh or frozen (no salt added) meat, poultry, seafood, and fish. Low-sodium canned tuna and salmon. Unsalted nuts. Dried peas, beans, and lentils without added salt. Unsalted canned beans. Eggs. Unsalted nut butters. Dairy Milk. Soy milk. Cheese that is naturally low in sodium, such as ricotta cheese, fresh mozzarella, or Swiss cheese Low-sodium or reduced-sodium cheese. Cream cheese. Yogurt. Fats and oils Unsalted butter. Unsalted margarine with no trans fat. Vegetable oils such as canola or olive oils. Seasonings and other foods Fresh and dried herbs and spices. Salt-free seasonings. Low-sodium mustard and ketchup. Sodium-free salad dressing. Sodium-free light mayonnaise. Fresh or refrigerated horseradish. Lemon juice. Vinegar. Homemade, reduced-sodium, or low-sodium soups. Unsalted popcorn and pretzels. Low-salt or salt-free chips. What foods are not recommended? The items listed may not be a complete list. Talk with your dietitian about what dietary choices are best for you. Grains Instant hot   cereals. Bread stuffing, pancake, and biscuit mixes. Croutons. Seasoned rice or pasta mixes. Noodle soup cups. Boxed or frozen macaroni and cheese. Regular salted crackers.  Self-rising flour. Vegetables Sauerkraut, pickled vegetables, and relishes. Olives. French fries. Onion rings. Regular canned vegetables (not low-sodium or reduced-sodium). Regular canned tomato sauce and paste (not low-sodium or reduced-sodium). Regular tomato and vegetable juice (not low-sodium or reduced-sodium). Frozen vegetables in sauces. Meats and other protein foods Meat or fish that is salted, canned, smoked, spiced, or pickled. Bacon, ham, sausage, hotdogs, corned beef, chipped beef, packaged lunch meats, salt pork, jerky, pickled herring, anchovies, regular canned tuna, sardines, salted nuts. Dairy Processed cheese and cheese spreads. Cheese curds. Blue cheese. Feta cheese. String cheese. Regular cottage cheese. Buttermilk. Canned milk. Fats and oils Salted butter. Regular margarine. Ghee. Bacon fat. Seasonings and other foods Onion salt, garlic salt, seasoned salt, table salt, and sea salt. Canned and packaged gravies. Worcestershire sauce. Tartar sauce. Barbecue sauce. Teriyaki sauce. Soy sauce, including reduced-sodium. Steak sauce. Fish sauce. Oyster sauce. Cocktail sauce. Horseradish that you find on the shelf. Regular ketchup and mustard. Meat flavorings and tenderizers. Bouillon cubes. Hot sauce and Tabasco sauce. Premade or packaged marinades. Premade or packaged taco seasonings. Relishes. Regular salad dressings. Salsa. Potato and tortilla chips. Corn chips and puffs. Salted popcorn and pretzels. Canned or dried soups. Pizza. Frozen entrees and pot pies. Summary  Eating less sodium can help lower your blood pressure, reduce swelling, and protect your heart, liver, and kidneys.  Most people on this plan should limit their sodium intake to 1,500-2,000 mg (milligrams) of sodium each day.  Canned, boxed, and frozen foods are high in sodium. Restaurant foods, fast foods, and pizza are also very high in sodium. You also get sodium by adding salt to food.  Try to cook at home, eat  more fresh fruits and vegetables, and eat less fast food, canned, processed, or prepared foods. This information is not intended to replace advice given to you by your health care provider. Make sure you discuss any questions you have with your health care provider. Document Released: 03/05/2002 Document Revised: 09/06/2016 Document Reviewed: 09/06/2016 Elsevier Interactive Patient Education  2019 [employer]  

## 2018-12-25 NOTE — Progress Notes (Signed)
Virtual Visit via Telephone Note     Evaluation Performed:  Follow-up visit  This visit type was conducted due to national recommendations for restrictions regarding the COVID-19 Pandemic (e.g. social distancing).  This format is felt to be most appropriate for this patient at this time.  All issues noted in this document were discussed and addressed.  No physical exam was performed (except for noted visual exam findings with Video Visits).  Please refer to the patient's chart (MyChart message for video visits and phone note for telephone visits) for the patient's consent to telehealth for [facility]  Date:  12/25/2018   ID:  Glen Colon, DOB December 16, 1950, MRN [id number]  Patient Location:  [place] [id number]   Provider location:   [facility] [place] [address], [place] [address]  PCP:  Alvester Chou, NP  Cardiologist:  Kathlyn Sacramento, MD Electrophysiologist:  None   Chief Complaint:  Moderate fatigue  History of Present Illness:    Glen Colon is a 68 y.o. male who presents via audio/video conferencing for a telehealth visit today.    The patient does not have symptoms concerning for COVID-19 infection (fever, chills, cough, or new SHORTNESS OF BREATH).   Patient reports moderate fatigue upon minimal exertion. He says that this has been present for several months. He has associated chronic cough, shortness of breath, intermittent chest pain and swelling in his legs which is improving in nature. He denies any difficulty sleeping, wheezing or palpitations. He hasn't been weighing because he doesn't have any scales but his sister could pick some up today. He says that the edema is a little better in the mornings after he's been in bed.   Prior CV studies:   The following studies were reviewed today:  Echo report from 12/25/2017 reviewed and showed an EF of 60-65%.   Past Medical History:  Diagnosis Date  . Alcohol  dependence ([place]) 10/13/2017  . Anemia   . Angina at rest [facility])    "take an aspirin & it goes right away" (10/26/2017)  . Anxiety   . Arthritis    "joints hurt; especially on cold days" (10/26/2017)  . Chronic bronchitis ([place])   . Cirrhosis ([place]) 10/13/2017  . COPD (chronic obstructive pulmonary disease) ([place])   . Epilepsy ([place]) 2018   "no sz if I take RX q night at bedtime" (10/26/2017)  . Hepatitis C, chronic ([place])    "tried tx in 2018; the RX gave me seizures; couldn't walk for 3 days; never finished the the tx" (10/26/2017)  . History of hiatal hernia   . Hypogonadism in male 10/18/2017  . Marijuana abuse 10/13/2017  . Pneumonia 1995  . Seizure ([place]) 2018   "related to hepatitis RX" (10/26/2017)  . Tobacco abuse   . Vitamin D deficiency 10/18/2017   Past Surgical History:  Procedure Laterality Date  . ESOPHAGOGASTRODUODENOSCOPY    . EXTERNAL FIXATION LEG Right 10/13/2017   Procedure: EXTERNAL FIXATION LEG;  Surgeon: Altamese Freeport, MD;  Location: [place];  Service: Orthopedics;  Laterality: Right;  . FRACTURE SURGERY    . I&D EXTREMITY Right 10/13/2017   Procedure: REPEAT IRRIGATION AND DEBRIDEMENT EXTREMITY WITH ADJUSTMENT OF EXTERNAL FIXITOR;  Surgeon: Altamese Eads, MD;  Location: [place];  Service: Orthopedics;  Laterality: Right;  . INTRAMEDULLARY (IM) NAIL FIBULA Right 10/13/2017   Procedure: INTRAMEDULLARY (IM) NAIL FIBULA;  Surgeon: Altamese Jerome, MD;  Location: [place];  Service: Orthopedics;  Laterality: Right;  . LIVER CYST REMOVAL    . [place], "walking pna"  . ORIF ANKLE FRACTURE Right 10/11/2017   Procedure: I & D right ankle with application of external fixator and wound vac;  Surgeon: Hessie Knows, MD;  Location: [facility];  Service: Orthopedics;  Laterality: Right;     Current Meds  Medication Sig  . albuterol (PROVENTIL HFA;VENTOLIN HFA) 108 (90 Base) MCG/ACT inhaler Inhale 2 puffs into the lungs every 6 (six) hours as needed for wheezing or  shortness of breath.  . alprazolam (XANAX) 2 MG tablet Take 1 mg by mouth 2 (two) times daily.  Marland Kitchen aspirin 325 MG tablet Take 325 mg by mouth daily.  . Fluticasone-Salmeterol (ADVAIR) 250-50 MCG/DOSE AEPB Inhale 1 puff into the lungs 2 (two) times daily.  . furosemide (LASIX) 40 MG tablet Take 1 tablet (40 mg total) by mouth 2 (two) times daily.  . metoprolol succinate (TOPROL-XL) 25 MG 24 hr tablet Take 0.5 tablets (12.5 mg total) by mouth daily.  Marland Kitchen PHENObarbital (LUMINAL) 100 MG tablet Take 100 mg by mouth at bedtime.   . potassium chloride (K-DUR) 10 MEQ tablet Take 2 tablets (20 mEq total) by mouth daily for 30 days.     Allergies:   Codeine   Social History   Tobacco Use  . Smoking status: Former Smoker    Packs/day: 0.25    Years: 46.00    Pack years: 11.50    Last attempt to quit: 10/11/2017    Years since quitting: 1.2  . Smokeless tobacco: Former Systems developer    Types: Chew  Substance Use Topics  . Alcohol use: Not Currently    Alcohol/week: 6.0 standard drinks    Types: 6 Cans of beer per week    Comment: 10/26/2017 "12 cans of beer a day  til 10/11/2017; nothing since 10/11/2017)  . Drug use: Not Currently    Types: Marijuana, Cocaine    Comment: 10/26/2017 "nothing since 1995"     Family Hx: The patient's family history is not on file.  ROS:   Please see the history of present illness.     All other systems reviewed and are negative.   Labs/Other Tests and Data Reviewed:    Recent Labs: 12/15/2018: ALT 46; B Natriuretic Peptide 619.0 12/16/2018: TSH 1.088 12/18/2018: BUN 13; Creatinine, Ser 0.74; Hemoglobin 13.6; Magnesium 1.8; Platelets 35; Potassium 3.0; Sodium 134   Recent Lipid Panel Lab Results  Component Value Date/Time   CHOL  07/06/2009 05:20 AM    197        ATP III CLASSIFICATION:  <200     mg/dL   Desirable  200-239  mg/dL   Borderline High  >=240    mg/dL   High          TRIG 150 (H) 07/06/2009 05:20 AM   HDL 34 (L) 07/06/2009 05:20 AM   CHOLHDL 5.8  07/06/2009 05:20 AM   LDLCALC (H) 07/06/2009 05:20 AM    133        Total Cholesterol/HDL:CHD Risk Coronary Heart Disease Risk Table                     Men   Women  1/2 Average Risk   3.4   3.3  Average Risk       5.0   4.4  2 X Average Risk   9.6   7.1  3 X Average Risk  23.4  11.0        Use the calculated Patient Ratio above and the CHD Risk Table to determine the patient's CHD Risk.        ATP III CLASSIFICATION (LDL):  <100     mg/dL   Optimal  100-129  mg/dL   Near or Above                    Optimal  130-159  mg/dL   Borderline  160-189  mg/dL   High  >190     mg/dL   Very High    Wt Readings from Last 3 Encounters:  12/18/18 217 lb 1.6 oz (98.5 kg)  10/11/17 205 lb (93 kg)  11/09/16 212 lb (96.2 kg)     Exam:    Vital Signs:  Pulse 63 Comment: self-reported  SpO2 97% Comment: self-reported   Well nourished, well developed male in no  acute distress.   ASSESSMENT & PLAN:    1. Chronic heart failure with preserved ejection fraction- - NYHA class III - euvolemic today per patient's description - not able to weigh as he doesn't have any scales. His sister says that she can come by the medical mall entrance to pick up some scales. Instructed to to weigh daily in the mornings, write it down and call for an overnight weight gain of >2 pounds or a weekly weight gain of >5 pounds. Scales sent to medical mall entrance with a volunteer.  - not adding salt and has been trying to follow a low sodium diet. Will send written dietary information to patient.  - has not smoked since recent admission and continued cessation discussed  2: Lymphedema- - stage 2 - says that his pedal edema is better in the mornings once he's had his legs elevated in bed but they don't go completely down - does have compression socks that he wears but he says that they feel really tight on his legs so he doesn't wear them daily - encouraged him to elevate his legs when sitting for long periods  of time - discussed lymphapress compression boots with patient if edema persists  3: Cirrhosis- - reports not drinking any alcohol since his recent admission - congratulated on this and continued cessation discussed  COVID-19 Education: The signs and symptoms of COVID-19 were discussed with the patient and how to seek care for testing (follow up with PCP or arrange E-visit).  The importance of social distancing was discussed today.  Patient Risk:   After full review of this patients clinical status, I feel that they are at least moderate risk at this time.  Time:   Today, I have spent 18 minutes with the patient with telehealth technology discussing heart failure, daily weights, medications and symptoms to call about.   Medication Adjustments/Labs and Tests Ordered: Current medicines are reviewed at length with the patient today.  Concerns regarding medicines are outlined above.   Tests Ordered: No orders of the defined types were placed in this encounter.  Medication Changes: No orders of the defined types were placed in this encounter.   Disposition:  Follow-up in 1 month or sooner for any questions/problems before then.   Signed, Alisa Graff, FNP  12/25/2018 1:11 PM    [facility]

## 2019-01-22 ENCOUNTER — Other Ambulatory Visit: Payer: Self-pay | Admitting: Family

## 2019-01-22 MED ORDER — METOPROLOL SUCCINATE ER 25 MG PO TB24: 12.5000 mg | ORAL_TABLET | Freq: Every day | ORAL | 5 refills | Status: DC

## 2019-01-22 MED ORDER — FUROSEMIDE 40 MG PO TABS: 40.0000 mg | ORAL_TABLET | Freq: Two times a day (BID) | ORAL | 5 refills | Status: DC

## 2019-01-29 ENCOUNTER — Telehealth: Payer: Self-pay | Admitting: Family

## 2019-01-29 ENCOUNTER — Telehealth: Payer: Medicare Other | Admitting: Family

## 2019-01-29 NOTE — Telephone Encounter (Signed)
Patient did not return messages left regarding [facility] Clinic telemedicine visit on 01/29/2019. Will attempt to reschedule.  

## 2019-11-07 ENCOUNTER — Other Ambulatory Visit: Payer: Self-pay

## 2019-11-07 ENCOUNTER — Inpatient Hospital Stay
Admission: EM | Admit: 2019-11-07 | Discharge: 2019-11-09 | DRG: 641 | Disposition: A | Discharge status: Home / Self Care | Payer: Medicare Other | Attending: Internal Medicine | Admitting: Internal Medicine

## 2019-11-07 ENCOUNTER — Emergency Department: Payer: Medicare Other

## 2019-11-07 ENCOUNTER — Encounter: Payer: Self-pay | Admitting: Emergency Medicine

## 2019-11-07 DIAGNOSIS — S52502A Unspecified fracture of the lower end of left radius, initial encounter for closed fracture: Secondary | ICD-10-CM

## 2019-11-07 DIAGNOSIS — R112 Nausea with vomiting, unspecified: Secondary | ICD-10-CM | POA: Diagnosis present

## 2019-11-07 DIAGNOSIS — I5020 Unspecified systolic (congestive) heart failure: Secondary | ICD-10-CM | POA: Diagnosis present

## 2019-11-07 DIAGNOSIS — E871 Hypo-osmolality and hyponatremia: Principal | ICD-10-CM | POA: Diagnosis present

## 2019-11-07 DIAGNOSIS — K7030 Alcoholic cirrhosis of liver without ascites: Secondary | ICD-10-CM | POA: Diagnosis not present

## 2019-11-07 DIAGNOSIS — E861 Hypovolemia: Secondary | ICD-10-CM | POA: Diagnosis present

## 2019-11-07 DIAGNOSIS — F17210 Nicotine dependence, cigarettes, uncomplicated: Secondary | ICD-10-CM | POA: Diagnosis present

## 2019-11-07 DIAGNOSIS — B182 Chronic viral hepatitis C: Secondary | ICD-10-CM | POA: Diagnosis present

## 2019-11-07 DIAGNOSIS — Z885 Allergy status to narcotic agent status: Secondary | ICD-10-CM

## 2019-11-07 DIAGNOSIS — G40909 Epilepsy, unspecified, not intractable, without status epilepticus: Secondary | ICD-10-CM | POA: Diagnosis present

## 2019-11-07 DIAGNOSIS — F419 Anxiety disorder, unspecified: Secondary | ICD-10-CM | POA: Diagnosis present

## 2019-11-07 DIAGNOSIS — K7460 Unspecified cirrhosis of liver: Secondary | ICD-10-CM | POA: Diagnosis present

## 2019-11-07 DIAGNOSIS — J9610 Chronic respiratory failure, unspecified whether with hypoxia or hypercapnia: Secondary | ICD-10-CM | POA: Diagnosis present

## 2019-11-07 DIAGNOSIS — R531 Weakness: Secondary | ICD-10-CM

## 2019-11-07 DIAGNOSIS — E669 Obesity, unspecified: Secondary | ICD-10-CM | POA: Diagnosis present

## 2019-11-07 DIAGNOSIS — Y92018 Other place in single-family (private) house as the place of occurrence of the external cause: Secondary | ICD-10-CM | POA: Diagnosis not present

## 2019-11-07 DIAGNOSIS — Z8701 Personal history of pneumonia (recurrent): Secondary | ICD-10-CM

## 2019-11-07 DIAGNOSIS — R197 Diarrhea, unspecified: Secondary | ICD-10-CM

## 2019-11-07 DIAGNOSIS — J449 Chronic obstructive pulmonary disease, unspecified: Secondary | ICD-10-CM | POA: Diagnosis present

## 2019-11-07 DIAGNOSIS — Z79899 Other long term (current) drug therapy: Secondary | ICD-10-CM | POA: Diagnosis not present

## 2019-11-07 DIAGNOSIS — K5900 Constipation, unspecified: Secondary | ICD-10-CM | POA: Diagnosis present

## 2019-11-07 DIAGNOSIS — Z6830 Body mass index (BMI) 30.0-30.9, adult: Secondary | ICD-10-CM | POA: Diagnosis not present

## 2019-11-07 DIAGNOSIS — D6959 Other secondary thrombocytopenia: Secondary | ICD-10-CM | POA: Diagnosis present

## 2019-11-07 DIAGNOSIS — E876 Hypokalemia: Secondary | ICD-10-CM | POA: Diagnosis present

## 2019-11-07 DIAGNOSIS — S52572A Other intraarticular fracture of lower end of left radius, initial encounter for closed fracture: Secondary | ICD-10-CM | POA: Diagnosis not present

## 2019-11-07 DIAGNOSIS — D696 Thrombocytopenia, unspecified: Secondary | ICD-10-CM | POA: Diagnosis not present

## 2019-11-07 DIAGNOSIS — Z7982 Long term (current) use of aspirin: Secondary | ICD-10-CM | POA: Diagnosis not present

## 2019-11-07 DIAGNOSIS — W109XXA Fall (on) (from) unspecified stairs and steps, initial encounter: Secondary | ICD-10-CM | POA: Diagnosis present

## 2019-11-07 DIAGNOSIS — Z20822 Contact with and (suspected) exposure to covid-19: Secondary | ICD-10-CM | POA: Diagnosis present

## 2019-11-07 DIAGNOSIS — K769 Liver disease, unspecified: Secondary | ICD-10-CM | POA: Diagnosis not present

## 2019-11-07 DIAGNOSIS — F1020 Alcohol dependence, uncomplicated: Secondary | ICD-10-CM | POA: Diagnosis present

## 2019-11-07 DIAGNOSIS — I959 Hypotension, unspecified: Secondary | ICD-10-CM | POA: Diagnosis present

## 2019-11-07 LAB — HEPATIC FUNCTION PANEL
ALT: 39 U/L (ref 0–44)
AST: 99 U/L — ABNORMAL HIGH (ref 15–41)
Albumin: 2.7 g/dL — ABNORMAL LOW (ref 3.5–5.0)
Alkaline Phosphatase: 194 U/L — ABNORMAL HIGH (ref 38–126)
Bilirubin, Direct: 1.5 mg/dL — ABNORMAL HIGH (ref 0.0–0.2)
Indirect Bilirubin: 2.7 mg/dL — ABNORMAL HIGH (ref 0.3–0.9)
Total Bilirubin: 4.2 mg/dL — ABNORMAL HIGH (ref 0.3–1.2)
Total Protein: 6.5 g/dL (ref 6.5–8.1)

## 2019-11-07 LAB — URINALYSIS, COMPLETE (UACMP) WITH MICROSCOPIC
Bacteria, UA: NONE SEEN
Bilirubin Urine: NEGATIVE
Glucose, UA: NEGATIVE mg/dL
Hgb urine dipstick: NEGATIVE
Ketones, ur: NEGATIVE mg/dL
Leukocytes,Ua: NEGATIVE
Nitrite: NEGATIVE
Protein, ur: NEGATIVE mg/dL
Specific Gravity, Urine: 1.014 (ref 1.005–1.030)
Squamous Epithelial / HPF: NONE SEEN (ref 0–5)
pH: 6 (ref 5.0–8.0)

## 2019-11-07 LAB — CBC
HCT: 44 % (ref 39.0–52.0)
Hemoglobin: 15.9 g/dL (ref 13.0–17.0)
MCH: 36.5 pg — ABNORMAL HIGH (ref 26.0–34.0)
MCHC: 36.1 g/dL — ABNORMAL HIGH (ref 30.0–36.0)
MCV: 100.9 fL — ABNORMAL HIGH (ref 80.0–100.0)
Platelets: 57 10*3/uL — ABNORMAL LOW (ref 150–400)
RBC: 4.36 MIL/uL (ref 4.22–5.81)
RDW: 13.6 % (ref 11.5–15.5)
WBC: 3.5 10*3/uL — ABNORMAL LOW (ref 4.0–10.5)
nRBC: 0 % (ref 0.0–0.2)

## 2019-11-07 LAB — PHOSPHORUS: Phosphorus: 2.7 mg/dL (ref 2.5–4.6)

## 2019-11-07 LAB — BASIC METABOLIC PANEL
Anion gap: 13 (ref 5–15)
Anion gap: 10 (ref 5–15)
BUN: <5 mg/dL — ABNORMAL LOW (ref 8–23)
BUN: <5 mg/dL — ABNORMAL LOW (ref 8–23)
CO2: 29 mmol/L (ref 22–32)
CO2: 31 mmol/L (ref 22–32)
Calcium: 7.9 mg/dL — ABNORMAL LOW (ref 8.9–10.3)
Calcium: 7.9 mg/dL — ABNORMAL LOW (ref 8.9–10.3)
Chloride: 86 mmol/L — ABNORMAL LOW (ref 98–111)
Chloride: 91 mmol/L — ABNORMAL LOW (ref 98–111)
Creatinine, Ser: 0.57 mg/dL — ABNORMAL LOW (ref 0.61–1.24)
Creatinine, Ser: 0.63 mg/dL (ref 0.61–1.24)
GFR calc Af Amer: >60 mL/min (ref >60)
GFR calc Af Amer: >60 mL/min (ref >60)
GFR calc non Af Amer: >60 mL/min (ref >60)
GFR calc non Af Amer: >60 mL/min (ref >60)
Glucose, Bld: 91 mg/dL (ref 70–99)
Glucose, Bld: 75 mg/dL (ref 70–99)
Potassium: 3.1 mmol/L — ABNORMAL LOW (ref 3.5–5.1)
Potassium: 3 mmol/L — ABNORMAL LOW (ref 3.5–5.1)
Sodium: 128 mmol/L — ABNORMAL LOW (ref 135–145)
Sodium: 132 mmol/L — ABNORMAL LOW (ref 135–145)

## 2019-11-07 LAB — RESPIRATORY PANEL BY RT PCR (FLU A&B, COVID)
Influenza A by PCR: NEGATIVE
Influenza B by PCR: NEGATIVE
SARS Coronavirus 2 by RT PCR: NEGATIVE

## 2019-11-07 LAB — NA AND K (SODIUM & POTASSIUM), RAND UR
Potassium Urine: 6 mmol/L
Sodium, Ur: <10 mmol/L

## 2019-11-07 LAB — OSMOLALITY: Osmolality: 288 mOsm/kg (ref 275–295)

## 2019-11-07 LAB — MAGNESIUM: Magnesium: 1.8 mg/dL (ref 1.7–2.4)

## 2019-11-07 LAB — PROTIME-INR
INR: 1.5 — ABNORMAL HIGH (ref 0.8–1.2)
Prothrombin Time: 18 seconds — ABNORMAL HIGH (ref 11.4–15.2)

## 2019-11-07 LAB — LACTIC ACID, PLASMA: Lactic Acid, Venous: 3.4 mmol/L (ref 0.5–1.9)

## 2019-11-07 LAB — PHENOBARBITAL LEVEL: Phenobarbital: 13.6 ug/mL — ABNORMAL LOW (ref 15.0–30.0)

## 2019-11-07 LAB — OSMOLALITY, URINE: Osmolality, Ur: 135 mOsm/kg — ABNORMAL LOW (ref 300–900)

## 2019-11-07 LAB — CREATININE, URINE, RANDOM: Creatinine, Urine: 30 mg/dL

## 2019-11-07 MED ORDER — MOMETASONE FURO-FORMOTEROL FUM 200-5 MCG/ACT IN AERO
2.0000 | INHALATION_SPRAY | Freq: Two times a day (BID) | RESPIRATORY_TRACT | Status: DC
  Administered 2019-11-07 – 2019-11-09 (×3): 2 via RESPIRATORY_TRACT
  Filled 2019-11-07 (×2): qty 8.8

## 2019-11-07 MED ORDER — SODIUM CHLORIDE 0.9 % IV BOLUS
500.0000 mL | Freq: Once | INTRAVENOUS | Status: AC
  Administered 2019-11-07: 500 mL via INTRAVENOUS

## 2019-11-07 MED ORDER — IOHEXOL 300 MG/ML  SOLN
100.0000 mL | Freq: Once | INTRAMUSCULAR | Status: AC | PRN
  Administered 2019-11-07: 15:00:00 100 mL via INTRAVENOUS

## 2019-11-07 MED ORDER — ADULT MULTIVITAMIN W/MINERALS CH
1.0000 | ORAL_TABLET | Freq: Every day | ORAL | Status: DC
  Administered 2019-11-07 – 2019-11-09 (×2): 1 via ORAL
  Filled 2019-11-07 (×3): qty 1

## 2019-11-07 MED ORDER — FOLIC ACID 1 MG PO TABS
500.0000 ug | ORAL_TABLET | Freq: Every day | ORAL | Status: DC
  Administered 2019-11-07 – 2019-11-09 (×2): 0.5 mg via ORAL
  Filled 2019-11-07 (×3): qty 1

## 2019-11-07 MED ORDER — ALPRAZOLAM 0.5 MG PO TABS: 1.0000 mg | ORAL_TABLET | Freq: Two times a day (BID) | ORAL | Status: DC | PRN

## 2019-11-07 MED ORDER — MORPHINE SULFATE (PF) 4 MG/ML IV SOLN
4.0000 mg | INTRAVENOUS | Status: DC | PRN
  Administered 2019-11-07 – 2019-11-08 (×2): 4 mg via INTRAVENOUS
  Filled 2019-11-07 (×2): qty 1

## 2019-11-07 MED ORDER — ALBUTEROL SULFATE (2.5 MG/3ML) 0.083% IN NEBU: 2.5000 mg | INHALATION_SOLUTION | Freq: Four times a day (QID) | RESPIRATORY_TRACT | Status: DC | PRN

## 2019-11-07 MED ORDER — ONDANSETRON HCL 4 MG/2ML IJ SOLN
4.0000 mg | Freq: Once | INTRAMUSCULAR | Status: AC
  Administered 2019-11-07: 14:00:00 4 mg via INTRAVENOUS
  Filled 2019-11-07: qty 2

## 2019-11-07 MED ORDER — THIAMINE HCL 100 MG PO TABS
100.0000 mg | ORAL_TABLET | Freq: Every day | ORAL | Status: DC
  Administered 2019-11-07 – 2019-11-09 (×2): 100 mg via ORAL
  Filled 2019-11-07 (×3): qty 1

## 2019-11-07 MED ORDER — ACETAMINOPHEN 650 MG RE SUPP: 650.0000 mg | Freq: Four times a day (QID) | RECTAL | Status: DC | PRN

## 2019-11-07 MED ORDER — POTASSIUM CHLORIDE CRYS ER 20 MEQ PO TBCR
40.0000 meq | EXTENDED_RELEASE_TABLET | ORAL | Status: DC
  Administered 2019-11-07: 40 meq via ORAL
  Filled 2019-11-07 (×2): qty 2

## 2019-11-07 MED ORDER — SODIUM CHLORIDE 0.9 % IV BOLUS
500.0000 mL | Freq: Once | INTRAVENOUS | Status: AC
  Administered 2019-11-07: 17:00:00 500 mL via INTRAVENOUS

## 2019-11-07 MED ORDER — ACETAMINOPHEN 325 MG PO TABS: 650.0000 mg | ORAL_TABLET | Freq: Four times a day (QID) | ORAL | Status: DC | PRN

## 2019-11-07 MED ORDER — LORAZEPAM 2 MG/ML IJ SOLN
1.0000 mg | INTRAMUSCULAR | Status: DC | PRN
  Filled 2019-11-07: qty 1

## 2019-11-07 MED ORDER — PHENOBARBITAL 32.4 MG PO TABS: 97.2000 mg | ORAL_TABLET | Freq: Every day | ORAL | Status: DC

## 2019-11-07 MED ORDER — LORAZEPAM 2 MG/ML IJ SOLN
0.0000 mg | Freq: Four times a day (QID) | INTRAMUSCULAR | Status: DC
  Administered 2019-11-08: 2 mg via INTRAVENOUS

## 2019-11-07 MED ORDER — PHENOBARBITAL 100 MG PO TABS
100.0000 mg | ORAL_TABLET | Freq: Every day | ORAL | Status: DC
  Filled 2019-11-07: qty 1

## 2019-11-07 MED ORDER — BUPIVACAINE HCL (PF) 0.5 % IJ SOLN
30.0000 mL | Freq: Once | INTRAMUSCULAR | Status: AC
  Administered 2019-11-07: 30 mL
  Filled 2019-11-07: qty 30

## 2019-11-07 MED ORDER — ONDANSETRON HCL 4 MG/2ML IJ SOLN
4.0000 mg | Freq: Four times a day (QID) | INTRAMUSCULAR | Status: DC | PRN
  Administered 2019-11-07 – 2019-11-08 (×3): 4 mg via INTRAVENOUS
  Filled 2019-11-07 (×3): qty 2

## 2019-11-07 MED ORDER — LORAZEPAM 2 MG/ML IJ SOLN: 0.0000 mg | Freq: Two times a day (BID) | INTRAMUSCULAR | Status: DC

## 2019-11-07 MED ORDER — OXYCODONE HCL 5 MG PO TABS
5.0000 mg | ORAL_TABLET | ORAL | Status: DC | PRN
  Administered 2019-11-07: 18:00:00 5 mg via ORAL
  Filled 2019-11-07 (×2): qty 1

## 2019-11-07 MED ORDER — FUROSEMIDE 40 MG PO TABS: 40.0000 mg | ORAL_TABLET | Freq: Two times a day (BID) | ORAL | Status: DC

## 2019-11-07 MED ORDER — ONDANSETRON HCL 4 MG PO TABS: 4.0000 mg | ORAL_TABLET | Freq: Four times a day (QID) | ORAL | Status: DC | PRN

## 2019-11-07 MED ORDER — LORAZEPAM 1 MG PO TABS
1.0000 mg | ORAL_TABLET | ORAL | Status: DC | PRN
  Administered 2019-11-08: 19:00:00 1 mg via ORAL
  Filled 2019-11-07: qty 1

## 2019-11-07 MED ORDER — HYDROCODONE-ACETAMINOPHEN 5-325 MG PO TABS
1.0000 | ORAL_TABLET | Freq: Once | ORAL | Status: AC
  Administered 2019-11-07: 14:00:00 1 via ORAL
  Filled 2019-11-07: qty 1

## 2019-11-07 MED ORDER — TIOTROPIUM BROMIDE MONOHYDRATE 18 MCG IN CAPS
18.0000 ug | ORAL_CAPSULE | Freq: Every day | RESPIRATORY_TRACT | Status: DC
  Administered 2019-11-07 – 2019-11-09 (×2): 18 ug via RESPIRATORY_TRACT
  Filled 2019-11-07 (×2): qty 5

## 2019-11-07 NOTE — ED Triage Notes (Addendum)
Patient presents to the ED via PV post fall.  Patient is complaining of left arm pain.  Patient states he fell going down his steps.  Patient states he is unsure of whether he hit his head or passed out.  Patient is tearful during triage.   Patient states is missed a step which caused the fall.  Patient states he is now feeling somewhat dizzy.

## 2019-11-07 NOTE — H&P (Addendum)
[facility] History and Physical   Patient: Glen Colon [id number]   PCP: Alvester Chou, NP DOB: 23-Jan-1951   DOA: 11/07/2019   DOS: 11/07/2019   DOS: the patient was seen and examined on 11/07/2019  Patient coming from: The patient is coming from Home  Chief Complaint: Fall  HPI: Glen Colon is a 69 y.o. male with Past medical history of COPD, chronic hep C, seizure disorder, chronic respiratory failure on 2 LPM, liver cirrhosis, alcohol abuse (6 beers per day). The patient presents with complaints of a fall.  Patient was trying to calm down the steps and he tripped and fell.  This happened last night.  Patient did not have a lot of pain last night.  He denies any head injury or neck injury denies any loss of consciousness.  No nausea no vomiting no fever no chills prior to that.  He denies any shortness of breath or cough which is new. He started having increasing pain of his left arm and therefore he came to the hospital. He also reported some abdominal pain which is also on the left side. At the time of my evaluation he did not have any pain in his abdomen and no other acute complaints. He denies any recent change in medication. He is oral intake is adequate. Drinks 6 beers a day. Also smokes cigarettes.  ED Course: Presented with a fall.  X-ray of wrist shows evidence of radius fracture.  Orthopedic was consulted.  In further work-up patient sodium was low potassium was low.  Due to patient's imbalance and fall further work-up was completed which was unremarkable patient was referred for admission for hyponatremia.  At his baseline ambulates with assistance independent for most of his ADL;  manages his medication on his own.  Review of Systems: as mentioned in the history of present illness.  All other systems reviewed and are negative.  Past Medical History:  Diagnosis Date  . Alcohol dependence ([place]) 10/13/2017  . Anemia   . Angina at rest [facility])    "take an aspirin &  it goes right away" (10/26/2017)  . Anxiety   . Arthritis    "joints hurt; especially on cold days" (10/26/2017)  . Chronic bronchitis ([place])   . Cirrhosis ([place]) 10/13/2017  . COPD (chronic obstructive pulmonary disease) ([place])   . Epilepsy ([place]) 2018   "no sz if I take RX q night at bedtime" (10/26/2017)  . Hepatitis C, chronic ([place])    "tried tx in 2018; the RX gave me seizures; couldn't walk for 3 days; never finished the the tx" (10/26/2017)  . History of hiatal hernia   . Hypogonadism in male 10/18/2017  . Marijuana abuse 10/13/2017  . Pneumonia 1995  . Seizure ([place]) 2018   "related to hepatitis RX" (10/26/2017)  . Tobacco abuse   . Vitamin D deficiency 10/18/2017   Past Surgical History:  Procedure Laterality Date  . ESOPHAGOGASTRODUODENOSCOPY    . EXTERNAL FIXATION LEG Right 10/13/2017   Procedure: EXTERNAL FIXATION LEG;  Surgeon: Altamese Riggins, MD;  Location: [place];  Service: Orthopedics;  Laterality: Right;  . FRACTURE SURGERY    . I & D EXTREMITY Right 10/13/2017   Procedure: REPEAT IRRIGATION AND DEBRIDEMENT EXTREMITY WITH ADJUSTMENT OF EXTERNAL FIXITOR;  Surgeon: Altamese Bellemeade, MD;  Location: [place];  Service: Orthopedics;  Laterality: Right;  . INTRAMEDULLARY (IM) NAIL FIBULA Right 10/13/2017   Procedure: INTRAMEDULLARY (IM) NAIL FIBULA;  Surgeon: Altamese [place], MD;  Location: [place];  Service: Orthopedics;  Laterality: Right;  . LIVER CYST REMOVAL    . [place], "walking pna"  . ORIF ANKLE FRACTURE Right 10/11/2017   Procedure: I & D right ankle with application of external fixator and wound vac;  Surgeon: Hessie Knows, MD;  Location: [facility];  Service: Orthopedics;  Laterality: Right;   Social History:  reports that he quit smoking about 2 years ago. He has a 11.50 pack-year smoking history. He has quit using smokeless tobacco.  His smokeless tobacco use included chew. He reports previous alcohol use of about 6.0 standard drinks of alcohol per week. He  reports previous drug use. Drugs: Marijuana and Cocaine.  Allergies  Allergen Reactions  . Codeine Nausea And Vomiting and Other (See Comments)    CHILLS & SWEATS "DEATHLY ILL" per Pt description   Family history reviewed and not pertinent  Prior to Admission medications   Medication Sig Start Date End Date Taking? Authorizing Provider  albuterol (PROVENTIL HFA;VENTOLIN HFA) 108 (90 Base) MCG/ACT inhaler Inhale 2 puffs into the lungs every 6 (six) hours as needed for wheezing or shortness of breath.   Yes [provider]  ALPRAZolam Duanne Moron) 1 MG tablet Take 1 mg by mouth 2 (two) times daily as needed for anxiety.    Yes [provider]  Fluticasone-Salmeterol (ADVAIR) 250-50 MCG/DOSE AEPB Inhale 1 puff into the lungs 2 (two) times daily.   Yes [provider]  folic acid (FOLVITE) A999333 MCG tablet Take 400 mcg by mouth daily.   Yes [provider]  furosemide (LASIX) 40 MG tablet Take 1 tablet (40 mg total) by mouth 2 (two) times daily. 01/22/19 01/22/20 Yes Darylene Price A, FNP  metoprolol succinate (TOPROL-XL) 25 MG 24 hr tablet Take 0.5 tablets (12.5 mg total) by mouth daily. 01/22/19  Yes Hackney, Tina A, FNP  PHENObarbital (LUMINAL) 100 MG tablet Take 100 mg by mouth at bedtime.    Yes [provider]  SPIRIVA HANDIHALER 18 MCG inhalation capsule Place 18 mcg into inhaler and inhale daily. 09/25/19  Yes [provider]  therapeutic multivitamin-minerals (THERAGRAN-M) tablet Take 1 tablet by mouth daily.   Yes [provider]  thiamine 100 MG tablet Take 100 mg by mouth daily.   Yes [provider]    Physical Exam: Vitals:   11/07/19 1255 11/07/19 1500 11/07/19 1700 11/07/19 1733  BP: 91/61 110/68 111/69 111/69  Pulse:  64 81 73  Resp:   13   Temp:    (!) 97.5 F (36.4 C)  TempSrc:    Oral  SpO2:  93% 94% 97%  Weight:      Height:        General: alert and oriented to time, place, and person. Appear in  moderate distress, affect appropriate Eyes: PERRL, Conjunctiva normal ENT: Oral Mucosa Clear, moist  Neck: no JVD, no Abnormal Mass Or lumps Cardiovascular: S1 and S2 Present, no Murmur, peripheral pulses symmetrical Respiratory: good respiratory effort, Bilateral Air entry equal and Decreased, no signs of accessory muscle use, Clear to Auscultation, no Crackles, no wheezes Abdomen: Bowel Sound present, Soft and no tenderness, no hernia Skin: no rashes  Extremities: no Pedal edema, no calf tenderness Neurologic: without any new focal findings Gait not checked due to patient safety concerns  Data Reviewed: I have personally reviewed and interpreted labs, imaging as discussed below.  CBC: Recent Labs  Lab 11/07/19 1301  WBC 3.5*  HGB 15.9  HCT 44.0  MCV 100.9*  PLT 57*   Basic Metabolic Panel: Recent Labs  Lab 11/07/19 1301  NA 128*  K 3.1*  CL 86*  CO2 29  GLUCOSE 91  BUN <5*  CREATININE 0.57*  CALCIUM 7.9*   GFR: Estimated Creatinine Clearance: 103.4 mL/min (A) (by C-G formula based on SCr of 0.57 mg/dL (L)). Liver Function Tests: Recent Labs  Lab 11/07/19 1405  AST 99*  ALT 39  ALKPHOS 194*  BILITOT 4.2*  PROT 6.5  ALBUMIN 2.7*   No results for input(s): LIPASE, AMYLASE in the last 168 hours. No results for input(s): AMMONIA in the last 168 hours. Coagulation Profile: Recent Labs  Lab 11/07/19 1456  INR 1.5*   Cardiac Enzymes: No results for input(s): CKTOTAL, CKMB, CKMBINDEX, TROPONINI in the last 168 hours. BNP (last 3 results) No results for input(s): PROBNP in the last 8760 hours. HbA1C: No results for input(s): HGBA1C in the last 72 hours. CBG: No results for input(s): GLUCAP in the last 168 hours. Lipid Profile: No results for input(s): CHOL, HDL, LDLCALC, TRIG, CHOLHDL, LDLDIRECT in the last 72 hours. Thyroid Function Tests: No results for input(s): TSH, T4TOTAL, FREET4, T3FREE, THYROIDAB in the last 72 hours. Anemia Panel: No results  for input(s): VITAMINB12, FOLATE, FERRITIN, TIBC, IRON, RETICCTPCT in the last 72 hours. Urine analysis:    Component Value Date/Time   COLORURINE YELLOW (A) 11/07/2019 1301   APPEARANCEUR CLEAR (A) 11/07/2019 1301   LABSPEC 1.014 11/07/2019 1301   PHURINE 6.0 11/07/2019 1301   GLUCOSEU NEGATIVE 11/07/2019 1301   HGBUR NEGATIVE 11/07/2019 1301   BILIRUBINUR NEGATIVE 11/07/2019 1301   KETONESUR NEGATIVE 11/07/2019 1301   PROTEINUR NEGATIVE 11/07/2019 1301   UROBILINOGEN 1.0 07/05/2009 0850   NITRITE NEGATIVE 11/07/2019 1301   LEUKOCYTESUR NEGATIVE 11/07/2019 1301    Radiological Exams on Admission: DG Wrist Complete Left  Result Date: 11/07/2019 CLINICAL DATA:  Pain following fall EXAM: LEFT WRIST - COMPLETE 3+ VIEW COMPARISON:  None. FINDINGS: Frontal, oblique, lateral, and ulnar deviation scaphoid images were obtained. There is a comminuted fracture of the distal radial metaphysis with mild dorsal displacement of several fracture fragments compared to the remainder of the bone. There is avulsion of the ulnar styloid. No other fractures are evident. No dislocation. There is osteoarthritic change in the scaphotrapezial and first carpal-metacarpal joints. Bones are osteoporotic. IMPRESSION: Bones osteoporotic. Comminuted fracture distal radial metaphysis with dorsal displacement of several fracture fragments with respect to the remainder of the radius. There is also avulsion of the ulnar styloid. No dislocation. Osteoarthritic change noted in the scaphotrapezial and first carpal-metacarpal joints. Electronically Signed   By: Lowella Grip III M.D.   On: 11/07/2019 13:39   CT Head Wo Contrast  Result Date: 11/07/2019 CLINICAL DATA:  Recent fall with left arm pain and headaches, initial encounter EXAM: CT HEAD WITHOUT CONTRAST TECHNIQUE: Contiguous axial images were obtained from the base of the skull through the vertex without intravenous contrast. COMPARISON:  None. FINDINGS: Brain: Mild  atrophic changes are noted. No findings to suggest acute hemorrhage, acute infarction or space-occupying mass lesion are noted. Vascular: No hyperdense vessel or unexpected calcification. Skull: Normal. Negative for fracture or focal lesion. Sinuses/Orbits: No acute finding. Other: None. IMPRESSION: Atrophic changes without acute abnormality. Electronically Signed   By: Inez Catalina M.D.   On: 11/07/2019 15:39   CT ABDOMEN PELVIS W CONTRAST  Result Date: 11/07/2019 CLINICAL DATA:  Abdominal trauma. Post fall going down steps. EXAM: CT ABDOMEN AND PELVIS WITH CONTRAST  TECHNIQUE: Multidetector CT imaging of the abdomen and pelvis was performed using the standard protocol following bolus administration of intravenous contrast. CONTRAST:  14mL OMNIPAQUE IOHEXOL 300 MG/ML  SOLN COMPARISON:  Abdominal MRI 11/08/2016 FINDINGS: Lower chest: Emphysema. No basilar pneumothorax. Mild dependent atelectasis. Mild cardiomegaly with primarily right heart dilatation. Coronary artery calcifications. Hepatobiliary: No evidence of hepatic injury. Hepatic cirrhosis with nodular contours. Cyst in the right lobe measuring 2.8 cm, as seen on prior exam. Cyst in the left lobe measuring 14 mm again seen, with additional cyst adjacent the gallbladder fossa. Ill-defined enhancing subcapsular focus series 2, image 15, with second ill-defined subcapsular enhancement in the left lobe series 2, image 28. Mild enhancing focus in the central liver with central low-density, nonspecific. Gallbladder physiologically distended with layering sludge or stones. Gallbladder wall thickening and pericholecystic edema which is likely related to chronic liver disease. There is no biliary dilatation. Pancreas: No evidence of injury. No ductal dilatation or inflammation. Spleen: No evidence of injury or perisplenic hematoma. Upper normal in size spanning 11.5 cm cranial caudal. No focal splenic lesion. Adrenals/Urinary Tract: No adrenal hemorrhage or renal  injury identified. No hydronephrosis. Bladder is distended. No bladder wall thickening. Stomach/Bowel: Nondistended stomach. Left upper quadrant and perigastric varices. Small duodenal diverticulum. No mesenteric hematoma. Wall thickening of the cecum and ascending colon likely portal colopathy. There is no evidence of bowel injury. Mild distal colonic diverticulosis without diverticulitis. Vascular/Lymphatic: No vascular injury. Abdominal aorta normal in caliber with atherosclerosis. There is no retroperitoneal fluid. Prominent left upper quadrant varices with splenorenal shunting. Portal vein is attenuated and small in caliber. Reproductive: Prostate is unremarkable. Other: Small volume abdominopelvic ascites measures simple fluid density. Small fat containing umbilical hernia. No free air. Musculoskeletal: No fracture of the lumbar spine or pelvis. Included ribs are intact. Degenerative change in the lumbar spine is most prominent at L4-L5 and L5-S1. Bone island in the right superior pubic ramus. No confluent body wall contusion. IMPRESSION: 1. No evidence of acute traumatic injury in the abdomen or pelvis. 2. Hepatic cirrhosis with small volume abdominopelvic ascites. There are 2 ill-defined enhancing foci in the liver, 1 in the left lobe and one in the central liver. Recommend nonemergent hepatic protocol MRI for characterization on an outpatient basis. Additional cysts in the liver were seen on prior exam. 3. Gallbladder wall thickening and pericholecystic edema is likely related to chronic liver disease. 4. Portal hypertension with perigastric varices, left upper quadrant varices and splenorenal shunting. Portal vein is small in caliber and attenuated. 5. Incidental colonic diverticulosis. Aortic Atherosclerosis (ICD10-I70.0). Electronically Signed   By: Keith Rake M.D.   On: 11/07/2019 15:48   CT Wrist Left Wo Contrast  Result Date: 11/07/2019 CLINICAL DATA:  Left wrist fracture. EXAM: CT OF THE  LEFT WRIST WITHOUT CONTRAST TECHNIQUE: Multidetector CT imaging was performed according to the standard protocol. Multiplanar CT image reconstructions were also generated. COMPARISON:  Radiographs, same date. FINDINGS: As demonstrated on the radiographs there is a complex comminuted but relatively nondisplaced fracture involving the distal radius. No significant dorsal impaction. No definite intra-articular involvement. There is fairly advanced osteoporosis for the patient's age. There is an associated avulsion fracture involving the ulnar styloid and mild radioulnar joint degenerative changes. The carpal bones are intact. The intercarpal joint spaces are maintained. Moderate degenerative changes at the [facility] joint of the thumb noted. The metacarpal bones are intact. IMPRESSION: 1. Comminuted but relatively nondisplaced distal radius fracture. 2. Small avulsion fracture involving the ulnar styloid. 3.  Advanced osteoporosis for the patient's age. Electronically Signed   By: Marijo Sanes M.D.   On: 11/07/2019 15:50   DG Chest Portable 1 View  Result Date: 11/07/2019 CLINICAL DATA:  Patient presents to the ED via PV post fall. Patient is complaining of left arm pain. Patient states he fell going down his steps. Patient states he is unsure of whether he hit his head or passed out. Patient is tearful durin.*comment was truncated* EXAM: PORTABLE CHEST 1 VIEW COMPARISON:  Chest radiograph 12/15/2018, 10/28/2017 FINDINGS: Stable cardiomediastinal contours with mildly enlarged heart size. No evidence of pneumothorax. There is mild blunting of the left costophrenic angle suggesting trace effusion. If you mild opacities at the left base likely reflect atelectasis or scarring. The right lung is clear. Midthoracic spine compression fractures appear similar to prior. IMPRESSION: 1.  No evidence of pneumothorax. 2. Probable trace left pleural effusion with opacities at the left base likely reflecting atelectasis or scarring.  Electronically Signed   By: Audie Pinto M.D.   On: 11/07/2019 14:15   I reviewed all nursing notes, pharmacy notes, vitals, pertinent old records.  Assessment/Plan 1.  Hyponatremia. In the setting of cirrhosis. Suspect this is secondary to hypervolemic hyponatremia although clinically patient appears to be euvolemic. We will check osmolality. Monitor. Patient was given IV fluid.  We will just hold off on further IV hydration. Initiate fluid restriction. Continue regular diet.  2.  Liver cirrhosis Liver lesion Varices Does not follow-up with GI outpatient. Already on Lasix. Recommend outpatient GI referral at the time of the discharge. 2 ill-defined enhancing foci in the liver, 1 in the left lobe and one in the central liver. Recommend nonemergent hepatic protocol MRI for characterization on an outpatient basis.  3.  Fall Distal radius fracture. Orthopedic Dr. Posey Pronto consulted. Currently plan is for pain control. Monitor.  4.  Alcohol abuse Drinks 6 beers a day. CIWA protocol. Monitor.  5.  Hypokalemia. Continue potassium replacement.  Nutrition: Cardiac diet DVT Prophylaxis: Subcutaneous Heparin   Advance goals of care discussion: Full code   Consults: EDP Discussed with Dr. Posey Pronto from orthopedics  Family Communication: no family was present at bedside, at the time of interview.  Disposition: Admitted as inpatient, telemetry unit. Likely to be discharged home, in 2 days.  I have discussed plan of care as described above with RN and patient/family.  Author: Berle Mull, MD [facility] 11/07/2019 6:41 PM   To reach On-call, see care teams to locate the attending and reach out to them via [email or web address]. If 7PM-7AM, please contact night-coverage If you still have difficulty reaching the attending provider, please page the [facility] (Director on Call) for [facility] on amion for assistance.

## 2019-11-07 NOTE — ED Notes (Signed)
Patient oxygen sat dropping to low 80s on room air. States he normally wears 2L at night. Patient placed on 2L [place]

## 2019-11-07 NOTE — ED Provider Notes (Addendum)
[facility] Emergency Department Provider Note    First MD Initiated Contact with Patient 11/07/19 1334     (approximate)  I have reviewed the triage vital signs and the nursing notes.   HISTORY  Chief Complaint Arm Pain    HPI Glen Colon is a 69 y.o. male extensive past medical history as listed below presents to the ER for evaluation of moderate to severe left forearm pain.  Patient states he tripped falling down the steps last night.  Does not remember why he lost consciousness did his head.  Landed on concrete.  Denies any chest pain or shoulder pain.  Denies any nausea or vomiting.  Does have some mild left-sided abdominal pain.    Past Medical History:  Diagnosis Date  . Alcohol dependence ([place]) 10/13/2017  . Anemia   . Angina at rest [facility])    "take an aspirin & it goes right away" (10/26/2017)  . Anxiety   . Arthritis    "joints hurt; especially on cold days" (10/26/2017)  . Chronic bronchitis ([place])   . Cirrhosis ([place]) 10/13/2017  . COPD (chronic obstructive pulmonary disease) ([place])   . Epilepsy ([place]) 2018   "no sz if I take RX q night at bedtime" (10/26/2017)  . Hepatitis C, chronic ([place])    "tried tx in 2018; the RX gave me seizures; couldn't walk for 3 days; never finished the the tx" (10/26/2017)  . History of hiatal hernia   . Hypogonadism in male 10/18/2017  . Marijuana abuse 10/13/2017  . Pneumonia 1995  . Seizure ([place]) 2018   "related to hepatitis RX" (10/26/2017)  . Tobacco abuse   . Vitamin D deficiency 10/18/2017   No family history on file. Past Surgical History:  Procedure Laterality Date  . ESOPHAGOGASTRODUODENOSCOPY    . EXTERNAL FIXATION LEG Right 10/13/2017   Procedure: EXTERNAL FIXATION LEG;  Surgeon: Altamese Avoca, MD;  Location: [place];  Service: Orthopedics;  Laterality: Right;  . FRACTURE SURGERY    . I & D EXTREMITY Right 10/13/2017   Procedure: REPEAT IRRIGATION AND DEBRIDEMENT EXTREMITY WITH ADJUSTMENT OF EXTERNAL  FIXITOR;  Surgeon: Altamese Hulbert, MD;  Location: [place];  Service: Orthopedics;  Laterality: Right;  . INTRAMEDULLARY (IM) NAIL FIBULA Right 10/13/2017   Procedure: INTRAMEDULLARY (IM) NAIL FIBULA;  Surgeon: Altamese [place], MD;  Location: [place];  Service: Orthopedics;  Laterality: Right;  . LIVER CYST REMOVAL    . [place], "walking pna"  . ORIF ANKLE FRACTURE Right 10/11/2017   Procedure: I & D right ankle with application of external fixator and wound vac;  Surgeon: Hessie Knows, MD;  Location: [facility];  Service: Orthopedics;  Laterality: Right;   Patient Active Problem List   Diagnosis Date Noted  . New onset of congestive heart failure ([place]) 12/16/2018  . Acute systolic CHF (congestive heart failure) ([place]) 12/15/2018  . Cellulitis of right ankle 10/26/2017  . Vitamin D deficiency 10/18/2017  . Hypogonadism in male 10/18/2017  . Hyponatremia   . Thrombocytopenia ([place])   . Marijuana abuse 10/13/2017  . Alcohol dependence ([place]) 10/13/2017  . Cirrhosis ([place]) 10/13/2017  . Hepatitis C, chronic ([place]) 10/13/2017  . Open pilon fracture of tibia 10/12/2017  . Open fracture of distal end of fibula and tibia 10/11/2017  . Chest pain 01/16/2013  . Tobacco abuse       Prior to Admission medications   Medication Sig Start Date End Date Taking? Authorizing Provider  albuterol (PROVENTIL HFA;VENTOLIN HFA) 108 (90 Base) MCG/ACT inhaler Inhale 2 puffs into the lungs every 6 (six) hours as needed for wheezing or shortness of breath.    [provider]  alprazolam Duanne Moron) 2 MG tablet Take 1 mg by mouth 2 (two) times daily.    [provider]  aspirin 325 MG tablet Take 325 mg by mouth daily.    [provider]  Fluticasone-Salmeterol (ADVAIR) 250-50 MCG/DOSE AEPB Inhale 1 puff into the lungs 2 (two) times daily.    [provider]  furosemide (LASIX) 40 MG tablet Take 1 tablet (40 mg total) by mouth 2 (two) times daily. 01/22/19 01/22/20   Alisa Graff, FNP  metoprolol succinate (TOPROL-XL) 25 MG 24 hr tablet Take 0.5 tablets (12.5 mg total) by mouth daily. 01/22/19   Alisa Graff, FNP  PHENObarbital (LUMINAL) 100 MG tablet Take 100 mg by mouth at bedtime.     [provider]  potassium chloride (K-DUR) 10 MEQ tablet Take 2 tablets (20 mEq total) by mouth daily for 30 days. 12/18/18 01/17/19  Otila Back, MD    Allergies Codeine    Social History Social History   Tobacco Use  . Smoking status: Former Smoker    Packs/day: 0.25    Years: 46.00    Pack years: 11.50    Quit date: 10/11/2017    Years since quitting: 2.0  . Smokeless tobacco: Former Systems developer    Types: Chew  Substance Use Topics  . Alcohol use: Not Currently    Alcohol/week: 6.0 standard drinks    Types: 6 Cans of beer per week    Comment: 10/26/2017 "12 cans of beer a day  til 10/11/2017; nothing since 10/11/2017)  . Drug use: Not Currently    Types: Marijuana, Cocaine    Comment: 10/26/2017 "nothing since 1995"    Review of Systems Patient denies headaches, rhinorrhea, blurry vision, numbness, shortness of breath, chest pain, edema, cough, abdominal pain, nausea, vomiting, diarrhea, dysuria, fevers, rashes or hallucinations unless otherwise stated above in HPI. ____________________________________________   PHYSICAL EXAM:  VITAL SIGNS: Vitals:   11/07/19 1253 11/07/19 1255  BP: (!) 83/57 91/61  Pulse: 80   Resp: 16   Temp: 98.6 F (37 C)   SpO2: 94%     Constitutional: Alert and oriented.  Eyes: Conjunctivae are normal.   Head: Atraumatic. Nose: No congestion/rhinnorhea. Mouth/Throat: Mucous membranes are moist.   Neck: No stridor. Painless ROM.  Cardiovascular: Normal rate, regular rhythm. Grossly normal heart sounds.  Good peripheral circulation. Respiratory: Normal respiratory effort.  No retractions. Lungs CTAB. Gastrointestinal: Soft and nontender. No distention. No abdominal bruits. No CVA tenderness. Genitourinary:    Musculoskeletal:  ttp with deformity of left wrist. No lower extremity tenderness nor edema.  No joint effusions. Neurologic:  Normal speech and language. No gross focal neurologic deficits are appreciated. No facial droop Skin:  Skin is warm, dry and intact. No rash noted. Psychiatric: Mood and affect are normal. Speech and behavior are normal.  ____________________________________________   LABS (all labs ordered are listed, but only abnormal results are displayed)  Results for orders placed or performed during the hospital encounter of 11/07/19 (from the past 24 hour(s))  Basic metabolic panel     Status: Abnormal   Collection Time: 11/07/19  1:01 PM  Result Value Ref Range   Sodium 128 (L) 135 - 145 mmol/L   Potassium 3.1 (L) 3.5 - 5.1 mmol/L   Chloride 86 (L) 98 - 111 mmol/L  CO2 29 22 - 32 mmol/L   Glucose, Bld 91 70 - 99 mg/dL   BUN <5 (L) 8 - 23 mg/dL   Creatinine, Ser 0.57 (L) 0.61 - 1.24 mg/dL   Calcium 7.9 (L) 8.9 - 10.3 mg/dL   GFR calc non Af Amer >60 >60 mL/min   GFR calc Af Amer >60 >60 mL/min   Anion gap 13 5 - 15  CBC     Status: Abnormal   Collection Time: 11/07/19  1:01 PM  Result Value Ref Range   WBC 3.5 (L) 4.0 - 10.5 K/uL   RBC 4.36 4.22 - 5.81 MIL/uL   Hemoglobin 15.9 13.0 - 17.0 g/dL   HCT 44.0 39.0 - 52.0 %   MCV 100.9 (H) 80.0 - 100.0 fL   MCH 36.5 (H) 26.0 - 34.0 pg   MCHC 36.1 (H) 30.0 - 36.0 g/dL   RDW 13.6 11.5 - 15.5 %   Platelets 57 (L) 150 - 400 K/uL   nRBC 0.0 0.0 - 0.2 %  Hepatic function panel     Status: Abnormal   Collection Time: 11/07/19  2:05 PM  Result Value Ref Range   Total Protein 6.5 6.5 - 8.1 g/dL   Albumin 2.7 (L) 3.5 - 5.0 g/dL   AST 99 (H) 15 - 41 U/L   ALT 39 0 - 44 U/L   Alkaline Phosphatase 194 (H) 38 - 126 U/L   Total Bilirubin 4.2 (H) 0.3 - 1.2 mg/dL   Bilirubin, Direct 1.5 (H) 0.0 - 0.2 mg/dL   Indirect Bilirubin 2.7 (H) 0.3 - 0.9 mg/dL  Protime-INR     Status: Abnormal   Collection Time: 11/07/19  2:56 PM   Result Value Ref Range   Prothrombin Time 18.0 (H) 11.4 - 15.2 seconds   INR 1.5 (H) 0.8 - 1.2   ____________________________________________  EKG My review and personal interpretation at Time: 15:07   Indication: weakness  Rate: 65  Rhythm: sinus Axis: normal Other: nonspecific st abn, no stemi ____________________________________________  RADIOLOGY  I personally reviewed all radiographic images ordered to evaluate for the above acute complaints and reviewed radiology reports and findings.  These findings were personally discussed with the patient.  Please see medical record for radiology report.  ____________________________________________   PROCEDURES  Procedure(s) performed:  .Ortho Injury Treatment  Date/Time: 11/07/2019 3:00 PM Performed by: Merlyn Lot, MD Authorized by: Merlyn Lot, MD   Consent:    Consent obtained:  Verbal   Consent given by:  Patient   Risks discussed:  Fracture, irreducible dislocation, recurrent dislocation, stiffness, restricted joint movement, nerve damage and vascular damage   Alternatives discussed:  No treatment, alternative treatment and immobilizationInjury location: wrist Location details: left wrist Injury type: fracture Fracture type: distal radius Pre-procedure neurovascular assessment: neurovascularly intact Pre-procedure range of motion: reduced Anesthesia: hematoma block  Anesthesia: Local anesthesia used: yes Local Anesthetic: bupivacaine 0.5% without epinephrine Anesthetic total: 5 mL  Patient sedated: NoManipulation performed: yes Skin traction used: yes X-ray confirmed reduction: yes Immobilization: splint Splint type: sugar tong Supplies used: Ortho-Glass Post-procedure neurovascular assessment: post-procedure neurovascularly intact  .Critical Care Performed by: Merlyn Lot, MD Authorized by: Merlyn Lot, MD   Critical care provider statement:    Critical care time (minutes):  35    Critical care time was exclusive of:  Separately billable procedures and treating other patients   Critical care was necessary to treat or prevent imminent or life-threatening deterioration of the following conditions:  Dehydration and metabolic crisis  Critical care was time spent personally by me on the following activities:  Development of treatment plan with patient or surrogate, discussions with consultants, evaluation of patient's response to treatment, examination of patient, obtaining history from patient or surrogate, ordering and performing treatments and interventions, ordering and review of laboratory studies, ordering and review of radiographic studies, pulse oximetry, re-evaluation of patient's condition and review of old charts      Critical Care performed: no ____________________________________________   INITIAL IMPRESSION / [place] / ED COURSE  Pertinent labs & imaging results that were available during my care of the patient were reviewed by me and considered in my medical decision making (see chart for details).   DDX:sah, sdh, edh, fracture, contusion, soft tissue injury, viscous injury, concussion, hemorrhage    Glen Colon is a 69 y.o. who presents to the ED with fall and symptoms as described above.  Patient with evidence of distal radius fracture.  Also noted to be mildly hypotensive and hyponatremic which will require IV fluids.  Suspect this is related to underlying liver pathology in setting of his known alcohol dependence.  Will give IV fluids.  Will order CT imaging to exclude other traumatic injury.  Clinical Course as of Nov 07 1599  Wed Nov 07, 2019  1453 Patient tolerated reduction of his left distal radius fracture.  This was recommended by Dr. Posey Pronto of orthopedic surgery.  He is also requested CT imaging of left wrist.  Patient be signed out to oncoming physician pending CT imaging to exclude other associated traumatic injury that would  require transfer.  Otherwise patient does appear to be hyponatremia as well as hypo tensive requiring IV fluids.   [PR]    Clinical Course User Index [PR] Merlyn Lot, MD    Patient be signed out to oncoming physician pending CT imaging and reassessment.  Anticipate admission to [facility] assuming no evidence of other traumatic injury requiring transfer.  The patient was evaluated in Emergency Department today for the symptoms described in the history of present illness. He/she was evaluated in the context of the global COVID-19 pandemic, which necessitated consideration that the patient might be at risk for infection with the SARS-CoV-2 virus that causes COVID-19. Institutional protocols and algorithms that pertain to the evaluation of patients at risk for COVID-19 are in a state of rapid change based on information released by regulatory bodies including the CDC and federal and state organizations. These policies and algorithms were followed during the patient's care in the ED.  As part of my medical decision making, I reviewed the following data within the [facility] notes reviewed and incorporated, Labs reviewed, notes from prior ED visits and [place] Controlled Substance Database   ____________________________________________   FINAL CLINICAL IMPRESSION(S) / ED DIAGNOSES  Final diagnoses:  Other closed intra-articular fracture of distal end of left radius, initial encounter  Weakness  Hyponatremia      NEW MEDICATIONS STARTED DURING THIS VISIT:  New Prescriptions   No medications on file     Note:  This document was prepared using Dragon voice recognition software and may include unintentional dictation errors.    Merlyn Lot, MD 11/07/19 1600    Merlyn Lot, MD 11/07/19 [phone]

## 2019-11-08 LAB — BASIC METABOLIC PANEL
Anion gap: 10 (ref 5–15)
Anion gap: 6 (ref 5–15)
Anion gap: 7 (ref 5–15)
Anion gap: 7 (ref 5–15)
BUN: 6 mg/dL — ABNORMAL LOW (ref 8–23)
BUN: 7 mg/dL — ABNORMAL LOW (ref 8–23)
BUN: 7 mg/dL — ABNORMAL LOW (ref 8–23)
BUN: 8 mg/dL (ref 8–23)
CO2: 27 mmol/L (ref 22–32)
CO2: 32 mmol/L (ref 22–32)
CO2: 33 mmol/L — ABNORMAL HIGH (ref 22–32)
CO2: 34 mmol/L — ABNORMAL HIGH (ref 22–32)
Calcium: 7.8 mg/dL — ABNORMAL LOW (ref 8.9–10.3)
Calcium: 7.9 mg/dL — ABNORMAL LOW (ref 8.9–10.3)
Calcium: 7.9 mg/dL — ABNORMAL LOW (ref 8.9–10.3)
Calcium: 8.1 mg/dL — ABNORMAL LOW (ref 8.9–10.3)
Chloride: 91 mmol/L — ABNORMAL LOW (ref 98–111)
Chloride: 92 mmol/L — ABNORMAL LOW (ref 98–111)
Chloride: 93 mmol/L — ABNORMAL LOW (ref 98–111)
Chloride: 93 mmol/L — ABNORMAL LOW (ref 98–111)
Creatinine, Ser: 0.69 mg/dL (ref 0.61–1.24)
Creatinine, Ser: 0.7 mg/dL (ref 0.61–1.24)
Creatinine, Ser: 0.72 mg/dL (ref 0.61–1.24)
Creatinine, Ser: 0.79 mg/dL (ref 0.61–1.24)
GFR calc Af Amer: >60 mL/min (ref >60)
GFR calc Af Amer: >60 mL/min (ref >60)
GFR calc Af Amer: >60 mL/min (ref >60)
GFR calc Af Amer: >60 mL/min (ref >60)
GFR calc non Af Amer: >60 mL/min (ref >60)
GFR calc non Af Amer: >60 mL/min (ref >60)
GFR calc non Af Amer: >60 mL/min (ref >60)
GFR calc non Af Amer: >60 mL/min (ref >60)
Glucose, Bld: 111 mg/dL — ABNORMAL HIGH (ref 70–99)
Glucose, Bld: 117 mg/dL — ABNORMAL HIGH (ref 70–99)
Glucose, Bld: 150 mg/dL — ABNORMAL HIGH (ref 70–99)
Glucose, Bld: 154 mg/dL — ABNORMAL HIGH (ref 70–99)
Potassium: 3.1 mmol/L — ABNORMAL LOW (ref 3.5–5.1)
Potassium: 3.2 mmol/L — ABNORMAL LOW (ref 3.5–5.1)
Potassium: 3.3 mmol/L — ABNORMAL LOW (ref 3.5–5.1)
Potassium: 3.5 mmol/L (ref 3.5–5.1)
Sodium: 130 mmol/L — ABNORMAL LOW (ref 135–145)
Sodium: 131 mmol/L — ABNORMAL LOW (ref 135–145)
Sodium: 132 mmol/L — ABNORMAL LOW (ref 135–145)
Sodium: 132 mmol/L — ABNORMAL LOW (ref 135–145)

## 2019-11-08 LAB — CBC
HCT: 40.4 % (ref 39.0–52.0)
Hemoglobin: 14.3 g/dL (ref 13.0–17.0)
MCH: 36.7 pg — ABNORMAL HIGH (ref 26.0–34.0)
MCHC: 35.4 g/dL (ref 30.0–36.0)
MCV: 103.6 fL — ABNORMAL HIGH (ref 80.0–100.0)
Platelets: 43 10*3/uL — ABNORMAL LOW (ref 150–400)
RBC: 3.9 MIL/uL — ABNORMAL LOW (ref 4.22–5.81)
RDW: 13.9 % (ref 11.5–15.5)
WBC: 2.9 10*3/uL — ABNORMAL LOW (ref 4.0–10.5)
nRBC: 0 % (ref 0.0–0.2)

## 2019-11-08 MED ORDER — METOCLOPRAMIDE HCL 5 MG/ML IJ SOLN
5.0000 mg | Freq: Four times a day (QID) | INTRAMUSCULAR | Status: DC
  Administered 2019-11-08 (×2): 5 mg via INTRAVENOUS
  Filled 2019-11-08 (×3): qty 2

## 2019-11-08 MED ORDER — LACTULOSE 10 GM/15ML PO SOLN
20.0000 g | Freq: Three times a day (TID) | ORAL | Status: DC
  Administered 2019-11-08 – 2019-11-09 (×3): 20 g via ORAL
  Filled 2019-11-08 (×4): qty 30

## 2019-11-08 MED ORDER — POTASSIUM CHLORIDE CRYS ER 20 MEQ PO TBCR
40.0000 meq | EXTENDED_RELEASE_TABLET | Freq: Once | ORAL | Status: DC
  Filled 2019-11-08: qty 2

## 2019-11-08 MED ORDER — LACTATED RINGERS IV SOLN: INTRAVENOUS | Status: DC

## 2019-11-08 MED ORDER — POTASSIUM CHLORIDE 10 MEQ/100ML IV SOLN
10.0000 meq | INTRAVENOUS | Status: AC
  Administered 2019-11-08 (×3): 10 meq via INTRAVENOUS
  Filled 2019-11-08 (×4): qty 100

## 2019-11-08 MED ORDER — PHENOBARBITAL 32.4 MG PO TABS
97.2000 mg | ORAL_TABLET | Freq: Every day | ORAL | Status: DC
  Administered 2019-11-08 (×2): 97.2 mg via ORAL
  Filled 2019-11-08 (×2): qty 3

## 2019-11-08 NOTE — Progress Notes (Signed)
OT Cancellation Note  Patient Details Name: DEWYANE ZUG MRN: [id number] DOB: 06/01/1951   Cancelled Treatment:    Reason Eval/Treat Not Completed: Other (comment). Consult received, chart reviewed. Spoke with RN, pt recently nauseated and receiving IV potassium. Will hold OT evaluation at this time and re-attempt at later time today, pending pt medically appropriate.   Jeni Salles, MPH, MS, OTR/L ascom [phone] 11/08/19, 10:39 AM

## 2019-11-08 NOTE — Evaluation (Signed)
Occupational Therapy Evaluation Patient Details [name] MRN: [id number] DOB: 1951/06/06 Today's Date: 11/08/2019    History of Present Illness 69 y.o. male with Past medical history of COPD, chronic hep C, seizure disorder, chronic respiratory failure on 2 LPM, liver cirrhosis, alcohol abuse (6 beers per day). The patient presents with complaints of a fall.  Also noted to be mildly hypotensive and hyponatremic. Patient was trying to come down the steps and he tripped and fell. Presents with significant LUE pain. Imaging reveals distal radius fracture. LUE splinted. Per ortho, plan for pain control and outpatient follow-up.   Clinical Impression   Pt was seen for OT evaluation this date. Prior to hospital admission, pt was modified indep with ADL and PRN SPC use for ambulation. Pt lives alone but states his sister can help out as needed. Currently pt demonstrates impairments as described below (See OT problem list) which functionally limit his ability to perform ADL/self-care tasks safely. Pt currently requires Min A for LB ADL and CGA to Min A for functional transfers. Unsteady, often requires cues for safety during session. Easily distractible. O2 sats on 2L >90% throughout, improving with PLB after initial instruction and consistent cueing to utilize during session.  Pt would benefit from skilled OT to address noted impairments and functional limitations (see below for any additional details) in order to maximize safety and independence while minimizing falls risk and caregiver burden. Upon hospital discharge, recommend [place] and 24/7 supervision/assist to maximize pt safety and return to functional independence during meaningful occupations of daily life.     Follow Up Recommendations  Home health OT;Supervision/Assistance - 24 hour    Equipment Recommendations  3 in 1 bedside commode    Recommendations for Other Services       Precautions / Restrictions Precautions Precautions:  Fall Required Braces or Orthoses: Other Brace Other Brace: LUE splinted for distal radius fracture Restrictions Other Position/Activity Restrictions: No formal WBing restrictions per MD/chart, assume NWBing to L wrist given fracture      Mobility Bed Mobility Overal bed mobility: Needs Assistance Bed Mobility: Supine to Sit     Supine to sit: Supervision;HOB elevated     General bed mobility comments: incr effort  Transfers Overall transfer level: Needs assistance Equipment used: 1 person hand held assist Transfers: Sit to/from Stand Sit to Stand: Min guard;Min assist         General transfer comment: unsteady, cues for safety    Balance Overall balance assessment: Needs assistance Sitting-balance support: No upper extremity supported;Feet supported Sitting balance-Leahy Scale: Fair     Standing balance support: Single extremity supported;During functional activity Standing balance-Leahy Scale: Fair                             ADL either performed or assessed with clinical judgement   ADL Overall ADL's : Needs assistance/impaired                                       General ADL Comments: Min A for LB ADL, set up and supervision for safety for UB ADL, CGA to Min A for ADL transfers and ADL mobility     Vision   Vision Assessment?: No apparent visual deficits     Perception     Praxis      Pertinent Vitals/Pain Pain Assessment: No/denies pain  Hand Dominance Right   Extremity/Trunk Assessment Upper Extremity Assessment Upper Extremity Assessment: RUE deficits/detail;LUE deficits/detail RUE Deficits / Details: WFL LUE Deficits / Details: shoulder at least 4/5 LUE: Unable to fully assess due to pain;Unable to fully assess due to immobilization   Lower Extremity Assessment Lower Extremity Assessment: Overall WFL for tasks assessed       Communication Communication Communication: HOH   Cognition Arousal/Alertness:  Awake/alert Behavior During Therapy: WFL for tasks assessed/performed Overall Cognitive Status: No family/caregiver present to determine baseline cognitive functioning                                 General Comments: Alert and oriented x4, follows commands with multimodal cues, cues to redirect frequently during session, decr safety awareness, awareness of deficits, impaired problem solving   General Comments       Exercises Other Exercises Other Exercises: Pt instructed in falls prevention, pursed lip breathing to support breath recovery after exertional activities; mod-max verbal cues and visual cues to utilize during session, with O2 sats on 2L above 90% throughout session, improving to >95% with PLB   Shoulder Instructions      Home Living Family/patient expects to be discharged to:: Private residence Living Arrangements: Alone Available Help at Discharge: Family;Available PRN/intermittently(sister) Type of Home: House Home Access: Stairs to enter Entrance Stairs-Number of Steps: 2 Entrance Stairs-Rails: (per pt, currently having rails installed to prevent future falls) Home Layout: One level     Bathroom Shower/Tub: Teacher, early years/pre: Standard     Home Equipment: Media planner - 2 wheels;Cane - single point          Prior Functioning/Environment Level of Independence: Independent with assistive device(s)        Comments: Ambulating with SPC PRN, mod indep with ADL, brother nearby brings the pt lunch few days/wk        OT Problem List: Decreased cognition;Decreased range of motion;Decreased strength;Decreased activity tolerance;Decreased safety awareness;Decreased knowledge of use of DME or AE;Impaired balance (sitting and/or standing);Impaired UE functional use      OT Treatment/Interventions: Self-care/ADL training;Therapeutic exercise;Therapeutic activities;Cognitive remediation/compensation;DME and/or AE  instruction;Patient/family education;Balance training    OT Goals(Current goals can be found in the care plan section) Acute Rehab OT Goals Patient Stated Goal: go home OT Goal Formulation: With patient Time For Goal Achievement: 11/22/19 Potential to Achieve Goals: Good ADL Goals Pt Will Perform Lower Body Dressing: sit to/from stand;with adaptive equipment;with min guard assist Pt Will Transfer to Toilet: ambulating;bedside commode;with supervision(LRAD for amb, no LOB) Additional ADL Goal #1: Pt will verbalize plan to implement at least 2 falls prevention strategies to maximize safety/independence.  OT Frequency: Min 1X/week   Barriers to D/C:            Co-evaluation              AM-PAC OT "6 Clicks" Daily Activity     Outcome Measure Help from another person eating meals?: None Help from another person taking care of personal grooming?: None Help from another person toileting, which includes using toliet, bedpan, or urinal?: A Little Help from another person bathing (including washing, rinsing, drying)?: A Little Help from another person to put on and taking off regular upper body clothing?: A Little Help from another person to put on and taking off regular lower body clothing?: A Little 6 Click Score: 20   End of Session Equipment Utilized During Treatment:  Gait belt;Oxygen  Activity Tolerance: Patient tolerated treatment well Patient left: in chair;with call bell/phone within reach;with chair alarm set  OT Visit Diagnosis: Other abnormalities of gait and mobility (R26.89);History of falling (Z91.81);Muscle weakness (generalized) (M62.81);Other symptoms and signs involving cognitive function                Time: 1350-1437 OT Time Calculation (min): 47 min Charges:  OT General Charges $OT Visit: 1 Visit OT Evaluation $OT Eval Moderate Complexity: 1 Mod OT Treatments $Therapeutic Activity: 8-22 mins  Jeni Salles, MPH, MS, OTR/L ascom [phone] 11/08/19,  4:44 PM

## 2019-11-08 NOTE — Progress Notes (Signed)
PT Cancellation Note  Patient Details [name] MRN: [id number] DOB: 1951-09-16   Cancelled Treatment:    Reason Eval/Treat Not Completed: Other (comment)Patient is vomiting; nsg reports that patient is not able to participate at this time.    [address], [place], [place] DPT 11/08/2019, 9:08 AM

## 2019-11-08 NOTE — Progress Notes (Signed)
[facility] Progress Note  Patient: Glen Colon    [id number]  DOA: 11/07/2019     Date of Service: the patient was seen and examined on 11/08/2019  Chief Complaint  Patient presents with  . Arm Pain   Brief hospital course: COPD, chronic hep C, liver cirrhosis, alcohol abuse (6 beers per day last drink 11/06/2019), chronic respiratory failure on 2 LPM, seizure disorder. Presented with a mechanical fall after tripping on his stairs.  He has sustained distal radius fracture orthopedic consulted. He was found to have hyponatremia and hypokalemia for which the patient was admitted to the hospital. Currently continues to report abdominal pain as well as nausea and had large-volume vomitus x1 Currently further plan is to treat intractable nausea and vomiting and monitor for improvement in oral intake..  Assessment and Plan: 1.   Hypovolemic Hyponatremia. In the setting of cirrhosis. Serum osmolarity 288, urine sodium undetected urine osmolality 135. Serum sodium has almost normalized. Continue IV LR in the setting of intractable nausea and vomiting.  2.  Liver cirrhosis Liver lesion Varices Does not follow-up with GI outpatient. Already on Lasix. Recommend outpatient GI referral at the time of the discharge. 2 ill-defined enhancing foci in the liver, 1 in the left lobe and one in the central liver. Recommend nonemergent hepatic protocol MRI for characterization on an outpatient basis.  3.  Fall Distal radius fracture. Orthopedic Dr. Posey Pronto consulted. Currently plan is for pain control and outpatient follow-up. CT head as well as CT abdomen pelvis negative for any acute fracture or any acute abnormality. Monitor.  4.  Alcohol abuse Drinks 6 beers a day. CIWA protocol. Monitor.  5.  Hypokalemia. Continue potassium replacement.  6.  Intractable nausea and vomiting. Constipation. CT abdomen negative for any acute abnormality. Therapy no other etiology other than  constipation which is seen on the CT scan. We will initiate lactulose. Continue with IV fluids. Adding scheduled Reglan for now.  7.  Obesity Body mass index is 30.44 kg/m.  Outpatient follow up.   Diet: clear liquid diet DVT Prophylaxis: Subcutaneous Heparin    Advance goals of care discussion: Full code  Family Communication: no family was present at bedside, at the time of interview.   Disposition:  Pt is from home, admitted with hyponatremia, hypokalemia, still has intractable nausea and vomiting with poor p.o. intake which is likely the cause of the hyponatremia hypokalemia, which precludes a safe discharge. Discharge to home, when able to tolerate oral diet.  Subjective: Reports nausea overnight.  Continues to have abdominal pain.  Reports one episode of nonbilious nonbloody vomiting.  No fever no chills.  No diarrhea.  Passing gas.  Physical Exam: General:  alert oriented to time, place, and person.  Appear in moderate distress, affect appropriate Eyes: PERRL ENT: Oral Mucosa Clear, moist  Neck: no JVD,  Cardiovascular: S1 and S2 Present, no Murmur,  Respiratory: good respiratory effort, Bilateral Air entry equal and Decreased, no Crackles, no wheezes Abdomen: Bowel Sound present, Soft and mild tenderness,  Skin: no rash Extremities: no Pedal edema, no calf tenderness Neurologic: without any new focal findings  Gait not checked due to patient safety concerns  Vitals:   11/07/19 2337 11/07/19 2339 11/08/19 0500 11/08/19 0726  BP: 116/83   (!) 102/59  Pulse: 80 72  90  Resp: 16     Temp: 98.6 F (37 C)   97.9 F (36.6 C)  TempSrc:    Oral  SpO2: (!) 89% 92%  92%  Weight:   93.5 kg   Height:        Intake/Output Summary (Last 24 hours) at 11/08/2019 0913 Last data filed at 11/08/2019 0503 Gross per 24 hour  Intake 1000 ml  Output 300 ml  Net 700 ml   Filed Weights   11/07/19 1254 11/08/19 0500  Weight: 95.3 kg 93.5 kg    Data Reviewed: I have  personally reviewed and interpreted daily labs, tele strips, imagings as discussed above. I reviewed all nursing notes, pharmacy notes, vitals, pertinent old records I have discussed plan of care as described above with RN and patient/family.  CBC: Recent Labs  Lab 11/07/19 1301 11/08/19 0604  WBC 3.5* 2.9*  HGB 15.9 14.3  HCT 44.0 40.4  MCV 100.9* 103.6*  PLT 57* 43*   Basic Metabolic Panel: Recent Labs  Lab 11/07/19 1301 11/07/19 1820 11/07/19 2004 11/07/19 2357 11/08/19 0604  NA 128* 132*  --  132* 132*  K 3.1* 3.0*  --  3.1* 3.2*  CL 86* 91*  --  92* 91*  CO2 29 31  --  33* 34*  GLUCOSE 91 75  --  154* 117*  BUN <5* <5*  --  6* 7*  CREATININE 0.57* 0.63  --  0.79 0.72  CALCIUM 7.9* 7.9*  --  7.9* 7.9*  MG  --   --  1.8  --   --   PHOS  --   --  2.7  --   --     Studies: DG Wrist Complete Left  Result Date: 11/07/2019 CLINICAL DATA:  Pain following fall EXAM: LEFT WRIST - COMPLETE 3+ VIEW COMPARISON:  None. FINDINGS: Frontal, oblique, lateral, and ulnar deviation scaphoid images were obtained. There is a comminuted fracture of the distal radial metaphysis with mild dorsal displacement of several fracture fragments compared to the remainder of the bone. There is avulsion of the ulnar styloid. No other fractures are evident. No dislocation. There is osteoarthritic change in the scaphotrapezial and first carpal-metacarpal joints. Bones are osteoporotic. IMPRESSION: Bones osteoporotic. Comminuted fracture distal radial metaphysis with dorsal displacement of several fracture fragments with respect to the remainder of the radius. There is also avulsion of the ulnar styloid. No dislocation. Osteoarthritic change noted in the scaphotrapezial and first carpal-metacarpal joints. Electronically Signed   By: Lowella Grip III M.D.   On: 11/07/2019 13:39   CT Head Wo Contrast  Result Date: 11/07/2019 CLINICAL DATA:  Recent fall with left arm pain and headaches, initial encounter  EXAM: CT HEAD WITHOUT CONTRAST TECHNIQUE: Contiguous axial images were obtained from the base of the skull through the vertex without intravenous contrast. COMPARISON:  None. FINDINGS: Brain: Mild atrophic changes are noted. No findings to suggest acute hemorrhage, acute infarction or space-occupying mass lesion are noted. Vascular: No hyperdense vessel or unexpected calcification. Skull: Normal. Negative for fracture or focal lesion. Sinuses/Orbits: No acute finding. Other: None. IMPRESSION: Atrophic changes without acute abnormality. Electronically Signed   By: Inez Catalina M.D.   On: 11/07/2019 15:39   CT ABDOMEN PELVIS W CONTRAST  Result Date: 11/07/2019 CLINICAL DATA:  Abdominal trauma. Post fall going down steps. EXAM: CT ABDOMEN AND PELVIS WITH CONTRAST TECHNIQUE: Multidetector CT imaging of the abdomen and pelvis was performed using the standard protocol following bolus administration of intravenous contrast. CONTRAST:  144mL OMNIPAQUE IOHEXOL 300 MG/ML  SOLN COMPARISON:  Abdominal MRI 11/08/2016 FINDINGS: Lower chest: Emphysema. No basilar pneumothorax. Mild dependent atelectasis. Mild cardiomegaly with primarily right heart dilatation. Coronary artery  calcifications. Hepatobiliary: No evidence of hepatic injury. Hepatic cirrhosis with nodular contours. Cyst in the right lobe measuring 2.8 cm, as seen on prior exam. Cyst in the left lobe measuring 14 mm again seen, with additional cyst adjacent the gallbladder fossa. Ill-defined enhancing subcapsular focus series 2, image 15, with second ill-defined subcapsular enhancement in the left lobe series 2, image 28. Mild enhancing focus in the central liver with central low-density, nonspecific. Gallbladder physiologically distended with layering sludge or stones. Gallbladder wall thickening and pericholecystic edema which is likely related to chronic liver disease. There is no biliary dilatation. Pancreas: No evidence of injury. No ductal dilatation or  inflammation. Spleen: No evidence of injury or perisplenic hematoma. Upper normal in size spanning 11.5 cm cranial caudal. No focal splenic lesion. Adrenals/Urinary Tract: No adrenal hemorrhage or renal injury identified. No hydronephrosis. Bladder is distended. No bladder wall thickening. Stomach/Bowel: Nondistended stomach. Left upper quadrant and perigastric varices. Small duodenal diverticulum. No mesenteric hematoma. Wall thickening of the cecum and ascending colon likely portal colopathy. There is no evidence of bowel injury. Mild distal colonic diverticulosis without diverticulitis. Vascular/Lymphatic: No vascular injury. Abdominal aorta normal in caliber with atherosclerosis. There is no retroperitoneal fluid. Prominent left upper quadrant varices with splenorenal shunting. Portal vein is attenuated and small in caliber. Reproductive: Prostate is unremarkable. Other: Small volume abdominopelvic ascites measures simple fluid density. Small fat containing umbilical hernia. No free air. Musculoskeletal: No fracture of the lumbar spine or pelvis. Included ribs are intact. Degenerative change in the lumbar spine is most prominent at L4-L5 and L5-S1. Bone island in the right superior pubic ramus. No confluent body wall contusion. IMPRESSION: 1. No evidence of acute traumatic injury in the abdomen or pelvis. 2. Hepatic cirrhosis with small volume abdominopelvic ascites. There are 2 ill-defined enhancing foci in the liver, 1 in the left lobe and one in the central liver. Recommend nonemergent hepatic protocol MRI for characterization on an outpatient basis. Additional cysts in the liver were seen on prior exam. 3. Gallbladder wall thickening and pericholecystic edema is likely related to chronic liver disease. 4. Portal hypertension with perigastric varices, left upper quadrant varices and splenorenal shunting. Portal vein is small in caliber and attenuated. 5. Incidental colonic diverticulosis. Aortic  Atherosclerosis (ICD10-I70.0). Electronically Signed   By: Keith Rake M.D.   On: 11/07/2019 15:48   CT Wrist Left Wo Contrast  Result Date: 11/07/2019 CLINICAL DATA:  Left wrist fracture. EXAM: CT OF THE LEFT WRIST WITHOUT CONTRAST TECHNIQUE: Multidetector CT imaging was performed according to the standard protocol. Multiplanar CT image reconstructions were also generated. COMPARISON:  Radiographs, same date. FINDINGS: As demonstrated on the radiographs there is a complex comminuted but relatively nondisplaced fracture involving the distal radius. No significant dorsal impaction. No definite intra-articular involvement. There is fairly advanced osteoporosis for the patient's age. There is an associated avulsion fracture involving the ulnar styloid and mild radioulnar joint degenerative changes. The carpal bones are intact. The intercarpal joint spaces are maintained. Moderate degenerative changes at the [facility] joint of the thumb noted. The metacarpal bones are intact. IMPRESSION: 1. Comminuted but relatively nondisplaced distal radius fracture. 2. Small avulsion fracture involving the ulnar styloid. 3. Advanced osteoporosis for the patient's age. Electronically Signed   By: Marijo Sanes M.D.   On: 11/07/2019 15:50   DG Chest Portable 1 View  Result Date: 11/07/2019 CLINICAL DATA:  Patient presents to the ED via PV post fall. Patient is complaining of left arm pain. Patient states he fell  going down his steps. Patient states he is unsure of whether he hit his head or passed out. Patient is tearful durin.*comment was truncated* EXAM: PORTABLE CHEST 1 VIEW COMPARISON:  Chest radiograph 12/15/2018, 10/28/2017 FINDINGS: Stable cardiomediastinal contours with mildly enlarged heart size. No evidence of pneumothorax. There is mild blunting of the left costophrenic angle suggesting trace effusion. If you mild opacities at the left base likely reflect atelectasis or scarring. The right lung is clear. Midthoracic  spine compression fractures appear similar to prior. IMPRESSION: 1.  No evidence of pneumothorax. 2. Probable trace left pleural effusion with opacities at the left base likely reflecting atelectasis or scarring. Electronically Signed   By: Audie Pinto M.D.   On: 11/07/2019 14:15    Scheduled Meds: . folic acid  [id number] mcg Oral Daily  . lactulose  20 g Oral TID  . LORazepam  0-4 mg Intravenous Q6H   Followed by  . [START ON 11/09/2019] LORazepam  0-4 mg Intravenous Q12H  . metoCLOPramide (REGLAN) injection  5 mg Intravenous Q6H  . mometasone-formoterol  2 puff Inhalation BID  . multivitamin with minerals  1 tablet Oral Daily  . PHENObarbital  97.2 mg Oral QHS  . thiamine  100 mg Oral Daily  . tiotropium  18 mcg Inhalation Daily   Continuous Infusions: . lactated ringers    . potassium chloride     PRN Meds: acetaminophen **OR** acetaminophen, albuterol, LORazepam **OR** LORazepam, morphine injection, ondansetron **OR** ondansetron (ZOFRAN) IV, oxyCODONE  Time spent: 35 minutes  Author: Berle Mull, MD [facility] 11/08/2019 9:13 AM  To reach On-call, see care teams to locate the attending and reach out to them via [email or web address]. If 7PM-7AM, please contact night-coverage If you still have difficulty reaching the attending provider, please page the [facility] (Director on Call) for [facility] on amion for assistance.

## 2019-11-09 DIAGNOSIS — S52572A Other intraarticular fracture of lower end of left radius, initial encounter for closed fracture: Secondary | ICD-10-CM

## 2019-11-09 DIAGNOSIS — K769 Liver disease, unspecified: Secondary | ICD-10-CM

## 2019-11-09 DIAGNOSIS — D696 Thrombocytopenia, unspecified: Secondary | ICD-10-CM

## 2019-11-09 DIAGNOSIS — S52502A Unspecified fracture of the lower end of left radius, initial encounter for closed fracture: Secondary | ICD-10-CM

## 2019-11-09 DIAGNOSIS — R197 Diarrhea, unspecified: Secondary | ICD-10-CM

## 2019-11-09 DIAGNOSIS — K7030 Alcoholic cirrhosis of liver without ascites: Secondary | ICD-10-CM

## 2019-11-09 DIAGNOSIS — E876 Hypokalemia: Secondary | ICD-10-CM

## 2019-11-09 LAB — BASIC METABOLIC PANEL
Anion gap: 7 (ref 5–15)
BUN: 6 mg/dL — ABNORMAL LOW (ref 8–23)
CO2: 29 mmol/L (ref 22–32)
Calcium: 8 mg/dL — ABNORMAL LOW (ref 8.9–10.3)
Chloride: 98 mmol/L (ref 98–111)
Creatinine, Ser: 0.54 mg/dL — ABNORMAL LOW (ref 0.61–1.24)
GFR calc Af Amer: >60 mL/min (ref >60)
GFR calc non Af Amer: >60 mL/min (ref >60)
Glucose, Bld: 86 mg/dL (ref 70–99)
Potassium: 3.3 mmol/L — ABNORMAL LOW (ref 3.5–5.1)
Sodium: 134 mmol/L — ABNORMAL LOW (ref 135–145)

## 2019-11-09 LAB — CBC
HCT: 38.7 % — ABNORMAL LOW (ref 39.0–52.0)
Hemoglobin: 14 g/dL (ref 13.0–17.0)
MCH: 37.2 pg — ABNORMAL HIGH (ref 26.0–34.0)
MCHC: 36.2 g/dL — ABNORMAL HIGH (ref 30.0–36.0)
MCV: 102.9 fL — ABNORMAL HIGH (ref 80.0–100.0)
Platelets: 41 10*3/uL — ABNORMAL LOW (ref 150–400)
RBC: 3.76 MIL/uL — ABNORMAL LOW (ref 4.22–5.81)
RDW: 13.5 % (ref 11.5–15.5)
WBC: 3.1 10*3/uL — ABNORMAL LOW (ref 4.0–10.5)
nRBC: 0 % (ref 0.0–0.2)

## 2019-11-09 LAB — MAGNESIUM: Magnesium: 1.6 mg/dL — ABNORMAL LOW (ref 1.7–2.4)

## 2019-11-09 LAB — C DIFFICILE QUICK SCREEN W PCR REFLEX
C Diff antigen: NEGATIVE
C Diff interpretation: NOT DETECTED
C Diff toxin: NEGATIVE

## 2019-11-09 MED ORDER — POTASSIUM CHLORIDE ER 20 MEQ PO TBCR: 20.0000 meq | EXTENDED_RELEASE_TABLET | Freq: Every day | ORAL | 0 refills | Status: DC

## 2019-11-09 MED ORDER — POTASSIUM CHLORIDE CRYS ER 20 MEQ PO TBCR
40.0000 meq | EXTENDED_RELEASE_TABLET | Freq: Once | ORAL | Status: AC
  Administered 2019-11-09: 40 meq via ORAL
  Filled 2019-11-09: qty 2

## 2019-11-09 MED ORDER — MAGNESIUM SULFATE 2 GM/50ML IV SOLN
2.0000 g | Freq: Once | INTRAVENOUS | Status: AC
  Administered 2019-11-09: 2 g via INTRAVENOUS
  Filled 2019-11-09: qty 50

## 2019-11-09 MED ORDER — LOPERAMIDE HCL 2 MG PO CAPS
4.0000 mg | ORAL_CAPSULE | Freq: Once | ORAL | Status: AC
  Administered 2019-11-09: 13:00:00 4 mg via ORAL
  Filled 2019-11-09: qty 2

## 2019-11-09 MED ORDER — OXYCODONE HCL 5 MG PO TABS: 5.0000 mg | ORAL_TABLET | ORAL | 0 refills | Status: DC | PRN

## 2019-11-09 MED ORDER — MAGNESIUM OXIDE 400 MG PO CAPS: 400.0000 mg | ORAL_CAPSULE | Freq: Every day | ORAL | 0 refills | Status: DC

## 2019-11-09 NOTE — Progress Notes (Signed)
Pt dischargesd at this time to home with sister mary godwin. Instructions discussed with pt.  meds / diet activity and f/u.  Verbalizes understanding wheeled out  At this time

## 2019-11-09 NOTE — TOC Initial Note (Signed)
Transition of Care [facility]) - Initial/Assessment Note    Patient Details  [name] MRN: [id number] Date of Birth: Jun 22, 1951  [facility] CM/SW Contact:    Elease Hashimoto, LCSW Phone Number: 11/09/2019, 11:11 AM  Clinical Narrative:   Met with pt to discuss discharge needs and plan. He states: " I have to get out of here I am not used to being inside and am claustrophobic."  He will talk with MD when rounds about leaving. He has a son who he see's daily and a girlfriend who are supportive and involved. He was independent prior to admission and has every piece of equipment he could need. He does not want any follow up feels can do it on his own. Addressed ETOH and he feels he is fine with this and wants no resources. Pt is quite anxious and thought he would be home by now. He is aware of his liver and heart issues. Will follow in case needs change. See pt later this afternoon regarding DC                Expected Discharge Plan: Home/Self Care Barriers to Discharge: Continued Medical Work up   Patient Goals and CMS Choice Patient states their goals for this hospitalization and ongoing recovery are:: I need to get out of here I am not used to being inside. I'm an outside guy      Expected Discharge Plan and Services Expected Discharge Plan: Home/Self Care In-house Referral: Clinical Social Work     Living arrangements for the past 2 months: Single Family Home                                      Prior Living Arrangements/Services Living arrangements for the past 2 months: Single Family Home Lives with:: Self   Do you feel safe going back to the place where you live?: Yes      Need for Family Participation in Patient Care: No (Comment) Care giver support system in place?: No (comment) Current home services: DME(Has all equipment-wc, rw, bsc, cane-etc)    Activities of Daily Living Home Assistive Devices/Equipment: None ADL Screening (condition at  time of admission) Patient's cognitive ability adequate to safely complete daily activities?: No Is the patient deaf or have difficulty hearing?: No Does the patient have difficulty seeing, even when wearing glasses/contacts?: No Does the patient have difficulty concentrating, remembering, or making decisions?: No Patient able to express need for assistance with ADLs?: No Does the patient have difficulty dressing or bathing?: No Independently performs ADLs?: Yes (appropriate for developmental age) Does the patient have difficulty walking or climbing stairs?: No Weakness of Legs: None Weakness of Arms/Hands: Left  Permission Sought/Granted                  Emotional Assessment Appearance:: Appears older than stated age Attitude/Demeanor/Rapport: Complaining Affect (typically observed): Adaptable, Anxious Orientation: : Oriented to Self, Oriented to Place, Oriented to  Time, Oriented to Situation Alcohol / Substance Use: Tobacco Use, Alcohol Use(Feels not an issue doesn't want resources for)    Admission diagnosis:  Hyponatremia [E87.1] Weakness [R53.1] Other closed intra-articular fracture of distal end of left radius, initial encounter [S52.572A] Patient Active Problem List   Diagnosis Date Noted  . New onset of congestive heart failure ([place]) 12/16/2018  . Acute systolic CHF (congestive heart failure) ([place]) 12/15/2018  . Cellulitis  of right ankle 10/26/2017  . Vitamin D deficiency 10/18/2017  . Hypogonadism in male 10/18/2017  . Hyponatremia   . Thrombocytopenia ([place])   . Marijuana abuse 10/13/2017  . Alcohol dependence ([place]) 10/13/2017  . Cirrhosis ([place]) 10/13/2017  . Hepatitis C, chronic ([place]) 10/13/2017  . Open pilon fracture of tibia 10/12/2017  . Open fracture of distal end of fibula and tibia 10/11/2017  . Chest pain 01/16/2013  . Tobacco abuse    PCP:  Alvester Chou, NP Pharmacy:   [facility], [address] [place] [address] Phone: [phone] Fax: [phone]     Social Determinants of Health (SDOH) Interventions    Readmission Risk Interventions No flowsheet data found.

## 2019-11-09 NOTE — Plan of Care (Signed)
  Problem: Education: Goal: Knowledge of medication regimen will be met for pain relief regimen by discharge Outcome: Progressing Goal: Understanding of ways to prevent infection will improve by discharge Outcome: Progressing   Problem: Coping: Goal: Ability to verbalize feelings will improve by discharge Outcome: Progressing Goal: Family members realistic understanding of the patients condition will improve by discharge Outcome: Progressing   Problem: Medication: Goal: Compliance with prescribed medication regimen will improve by discharge Outcome: Progressing   Problem: Respiratory: Goal: Ability to maintain adequate oxygenation and ventilation will improve by discharge Outcome: Progressing   Problem: Pain Management: Goal: Satisfaction with pain management regimen will be met by discharge Outcome: Progressing   Problem: Education: Goal: Knowledge of General Education information will improve Description: Including pain rating scale, medication(s)/side effects and non-pharmacologic comfort measures Outcome: Progressing   Problem: Health Behavior/Discharge Planning: Goal: Ability to manage health-related needs will improve Outcome: Progressing   Problem: Clinical Measurements: Goal: Ability to maintain clinical measurements within normal limits will improve Outcome: Progressing Goal: Will remain free from infection Outcome: Progressing Goal: Diagnostic test results will improve Outcome: Progressing Goal: Respiratory complications will improve Outcome: Progressing Goal: Cardiovascular complication will be avoided Outcome: Progressing   Problem: Activity: Goal: Risk for activity intolerance will decrease Outcome: Progressing   Problem: Nutrition: Goal: Adequate nutrition will be maintained Outcome: Progressing   Problem: Coping: Goal: Level of anxiety will decrease Outcome: Progressing   Problem: Elimination: Goal: Will not experience complications related to  bowel motility Outcome: Progressing Goal: Will not experience complications related to urinary retention Outcome: Progressing   Problem: Pain Managment: Goal: General experience of comfort will improve Outcome: Progressing   Problem: Safety: Goal: Ability to remain free from injury will improve Outcome: Progressing   Problem: Skin Integrity: Goal: Risk for impaired skin integrity will decrease Outcome: Progressing

## 2019-11-09 NOTE — Evaluation (Signed)
Physical Therapy Evaluation Patient Details [name] MRN: [id number] DOB: [birth year] Today's Date: 11/09/2019   History of Present Illness  Pt is a 69 y.o. male with Past medical history of COPD, chronic hep C, seizure disorder, chronic respiratory failure on 2 LPM (at night), liver cirrhosis, alcohol abuse (6 beers per day). The patient presents with complaints of a fall.  Also noted to be mildly hypotensive and hyponatremic. Patient was trying to come down the steps and he tripped and fell. Presents with significant LUE pain. Imaging reveals L distal radius fracture and small avulsion fx involving ulnar styloid. LUE splinted. Per ortho, plan for pain control and outpatient follow-up.  Clinical Impression  Prior to hospital admission, pt was modified independent (occasionally used SPC for ambulation) and lives alone (but has support from family PRN); uses 2 L O2 at night.  Currently pt is SBA semi-supine to sitting edge of bed; CGA with transfers; and CGA to walk a few feet bed to recliner with single UE support.  Pt reporting being "weak as a kitten" and noted with SOB with activity; pt also noted to be impulsive at times and other times just quick with mobility requiring assist for safety to manage lines; overall pt steady with mobility assessed.  Pt would benefit from skilled PT to address noted impairments and functional limitations (see below for any additional details).  Upon hospital discharge, pt would benefit from HHPT and 24/7 supervision/assist.    Follow Up Recommendations Home health PT;Supervision/Assistance - 24 hour    Equipment Recommendations  Cane(pt reports having SPC at home already)    Recommendations for Other Services OT consult     Precautions / Restrictions Precautions Precautions: Fall Required Braces or Orthoses: Other Brace Other Brace: LUE splinted for distal radius fracture Restrictions Other Position/Activity Restrictions: No formal WBing  restrictions per MD/chart, assume NWBing to L wrist given fracture      Mobility  Bed Mobility Overal bed mobility: Needs Assistance Bed Mobility: Supine to Sit     Supine to sit: Supervision;HOB elevated     General bed mobility comments: SBA for lines  Transfers Overall transfer level: Needs assistance Equipment used: None Transfers: Sit to/from [facility] Sit to Stand: Min guard Stand pivot transfers: Min guard       General transfer comment: CGA to stand from bed x2 trials; CGA stand step turn bed to[facility]from [facility]; assist for lines for safety  Ambulation/Gait Ambulation/Gait assistance: Min guard Gait Distance (Feet): 3 Feet(bed to recliner) Assistive device: IV Pole       General Gait Details: took steps bed to recliner with UE support on IV pole  Stairs            Wheelchair Mobility    Modified Rankin (Stroke Patients Only)       Balance Overall balance assessment: Needs assistance Sitting-balance support: No upper extremity supported;Feet supported Sitting balance-Leahy Scale: Good Sitting balance - Comments: steady sitting reaching within BOS   Standing balance support: No upper extremity supported Standing balance-Leahy Scale: Good Standing balance comment: CGA standing performing toilet hygiene                             Pertinent Vitals/Pain Pain Assessment: Faces Faces Pain Scale: No hurt Pain Intervention(s): Limited activity within patient's tolerance;Monitored during session;Repositioned  Pt on 2 L O2 via nasal cannula during session.    Home Living Family/patient expects to be discharged to::  Private residence Living Arrangements: Alone Available Help at Discharge: Family;Available PRN/intermittently(son and pt's sister) Type of Home: House Home Access: Stairs to enter Entrance Stairs-Rails: (pt reports currently getting rails placed) Entrance Stairs-Number of Steps: 2 Home Layout: One level Home  Equipment: Media planner - 2 wheels;Cane - single point;Grab bars - tub/shower      Prior Function Level of Independence: Independent with assistive device(s)         Comments: Ambulating with SPC PRN, mod indep with ADL, brother nearby brings the pt lunch few days/wk     Hand Dominance   Dominant Hand: Right    Extremity/Trunk Assessment   Upper Extremity Assessment Upper Extremity Assessment: Defer to OT evaluation RUE Deficits / Details: WFL LUE Deficits / Details: shoulder at least 4/5 LUE: (L UE in splint)    Lower Extremity Assessment Lower Extremity Assessment: Overall WFL for tasks assessed    Cervical / Trunk Assessment Cervical / Trunk Assessment: Normal  Communication   Communication: HOH  Cognition Arousal/Alertness: Awake/alert Behavior During Therapy: Impulsive Overall Cognitive Status: No family/caregiver present to determine baseline cognitive functioning                                 General Comments: A&O x4; pt requiring redirection at times; decreased safety awareness noted with lines during session (appearing impulsive with mobility at times)      General Comments General comments (skin integrity, edema, etc.): L UE in splint  Nursing cleared pt for participation in physical therapy.  Pt agreeable to PT session.    Exercises     Assessment/Plan    PT Assessment Patient needs continued PT services  PT Problem List Decreased strength;Decreased activity tolerance;Decreased balance;Decreased mobility;Decreased knowledge of use of DME;Decreased safety awareness;Decreased knowledge of precautions       PT Treatment Interventions DME instruction;Gait training;Stair training;Functional mobility training;Therapeutic activities;Therapeutic exercise;Balance training;Patient/family education    PT Goals (Current goals can be found in the Care Plan section)  Acute Rehab PT Goals Patient Stated Goal: go home PT Goal  Formulation: With patient Time For Goal Achievement: 11/23/19 Potential to Achieve Goals: Good    Frequency Min 2X/week   Barriers to discharge        Co-evaluation               AM-PAC PT "6 Clicks" Mobility  Outcome Measure Help needed turning from your back to your side while in a flat bed without using bedrails?: None Help needed moving from lying on your back to sitting on the side of a flat bed without using bedrails?: A Little Help needed moving to and from a bed to a chair (including a wheelchair)?: A Little Help needed standing up from a chair using your arms (e.g., wheelchair or bedside chair)?: A Little Help needed to walk in hospital room?: A Little Help needed climbing 3-5 steps with a railing? : A Little 6 Click Score: 19    End of Session Equipment Utilized During Treatment: Gait belt Activity Tolerance: Patient tolerated treatment well Patient left: in chair;with call bell/phone within reach;with chair alarm set Nurse Communication: Mobility status;Precautions PT Visit Diagnosis: Other abnormalities of gait and mobility (R26.89);Muscle weakness (generalized) (M62.81);History of falling (Z91.81)    Time: [id number] PT Time Calculation (min) (ACUTE ONLY): 23 min   Charges:   PT Evaluation $PT Eval Low Complexity: 1 Low PT Treatments $Therapeutic Activity: 8-22 mins  Leitha Bleak, PT 11/09/19, 10:08 AM

## 2019-11-09 NOTE — Discharge Summary (Signed)
[place] [facility] [place] [name]    MR#:  [id number]  DATE OF BIRTH:  1951/02/04  DATE OF ADMISSION:  11/07/2019 ADMITTING PHYSICIAN: Glen Hamman, MD  DATE OF DISCHARGE: 11/09/2019  3:37 PM  PRIMARY CARE PHYSICIAN: Glen Chou, NP    ADMISSION DIAGNOSIS:  Hyponatremia [E87.1] Weakness [R53.1] Other closed intra-articular fracture of distal end of left radius, initial encounter [S52.572A]  DISCHARGE DIAGNOSIS:  Active Problems:   Hyponatremia   SECONDARY DIAGNOSIS:   Past Medical History:  Diagnosis Date  . Alcohol dependence ([place]) 10/13/2017  . Anemia   . Angina at rest [facility])    "take an aspirin & it goes right away" (10/26/2017)  . Anxiety   . Arthritis    "joints hurt; especially on cold days" (10/26/2017)  . Chronic bronchitis ([place])   . Cirrhosis ([place]) 10/13/2017  . COPD (chronic obstructive pulmonary disease) ([place])   . Epilepsy ([place]) 2018   "no sz if I take RX q night at bedtime" (10/26/2017)  . Hepatitis C, chronic ([place])    "tried tx in 2018; the RX gave me seizures; couldn't walk for 3 days; never finished the the tx" (10/26/2017)  . History of hiatal hernia   . Hypogonadism in male 10/18/2017  . Marijuana abuse 10/13/2017  . Pneumonia 1995  . Seizure ([place]) 2018   "related to hepatitis RX" (10/26/2017)  . Tobacco abuse   . Vitamin D deficiency 10/18/2017    HOSPITAL COURSE:   1.  Left distal radius fracture.  Seen by Dr. Posey Colon orthopedic.  Pain control with a few doses of oxycodone upon discharge home.  Follow-up with Dr. Posey Colon as outpatient. 2.  Hyponatremia in the setting of cirrhosis.  Sodium was 128 upon presentation and up to 134 upon discharge. 3.  Liver cirrhosis, 2 ill-defined lesions on the liver.  Recommend an outpatient MRI of the abdomen for further evaluation. 4.  Thrombocytopenia secondary to alcohol abuse and liver cirrhosis. 5.  Alcohol abuse.  Patient does not want to stop drinking. 6.  Hypokalemia  and hypomagnesemia.  I ordered potassium and magnesium supplementation.  We will also give magnesium supplementation upon going home. 7.  Diarrhea.  Lactulose was started but the patient does not have an ammonia level.  Patient states that he does not want anything to move his bowels.  I stopped the lactulose and did give a dose of Imodium.  C. difficile negative. 8.  COPD.  Continue inhalers 9.  History of seizure on phenobarbital  The patient told me he is not staying in the hospital any longer and is getting out of here today.  DISCHARGE CONDITIONS:   Fair  CONSULTS OBTAINED:  Orthopedic surgery Dr. Posey Colon  DRUG ALLERGIES:   Allergies  Allergen Reactions  . Codeine Nausea And Vomiting and Other (See Comments)    CHILLS & SWEATS "DEATHLY ILL" per Pt description    DISCHARGE MEDICATIONS:   Allergies as of 11/09/2019      Reactions   Codeine Nausea And Vomiting, Other (See Comments)   CHILLS & SWEATS "DEATHLY ILL" per Pt description      Medication List    STOP taking these medications   ALPRAZolam 1 MG tablet Commonly known as: XANAX   furosemide 40 MG tablet Commonly known as: Lasix   metoprolol succinate 25 MG 24 hr tablet Commonly known as: TOPROL-XL     TAKE these medications   albuterol 108 (90 Base) MCG/ACT inhaler Commonly  known as: VENTOLIN HFA Inhale 2 puffs into the lungs every 6 (six) hours as needed for wheezing or shortness of breath.   Fluticasone-Salmeterol 250-50 MCG/DOSE Aepb Commonly known as: ADVAIR Inhale 1 puff into the lungs 2 (two) times daily.   folic acid A999333 MCG tablet Commonly known as: FOLVITE Take 400 mcg by mouth daily.   Magnesium Oxide 400 MG Caps Take 1 capsule (400 mg total) by mouth daily at 2 PM. Start taking on: November 10, 2019   oxyCODONE 5 MG immediate release tablet Commonly known as: Oxy IR/ROXICODONE Take 1 tablet (5 mg total) by mouth every 4 (four) hours as needed for moderate pain or severe pain.    PHENObarbital 100 MG tablet Commonly known as: LUMINAL Take 100 mg by mouth at bedtime.   Potassium Chloride ER 20 MEQ Tbcr Take 20 mEq by mouth daily for 5 days. Notes to patient: STOP AFTER 5 DAYS   Spiriva HandiHaler 18 MCG inhalation capsule Generic drug: tiotropium Place 18 mcg into inhaler and inhale daily.   therapeutic multivitamin-minerals tablet Take 1 tablet by mouth daily.   thiamine 100 MG tablet Take 100 mg by mouth daily.        DISCHARGE INSTRUCTIONS:   Follow-up PMD 5 days Follow-up orthopedic surgery 2 weeks  If you experience worsening of your admission symptoms, develop shortness of breath, life threatening emergency, suicidal or homicidal thoughts you must seek medical attention immediately by calling 911 or calling your MD immediately  if symptoms less severe.  You Must read complete instructions/literature along with all the possible adverse reactions/side effects for all the Medicines you take and that have been prescribed to you. Take any new Medicines after you have completely understood and accept all the possible adverse reactions/side effects.   Please note  You were cared for by a hospitalist during your hospital stay. If you have any questions about your discharge medications or the care you received while you were in the hospital after you are discharged, you can call the unit and asked to speak with the hospitalist on call if the hospitalist that took care of you is not available. Once you are discharged, your primary care physician will handle any further medical issues. Please note that NO REFILLS for any discharge medications will be authorized once you are discharged, as it is imperative that you return to your primary care physician (or establish a relationship with a primary care physician if you do not have one) for your aftercare needs so that they can reassess your need for medications and monitor your lab values.    Today   CHIEF  COMPLAINT:   Chief Complaint  Patient presents with  . Arm Pain    HISTORY OF PRESENT ILLNESS:  Glen Colon  is a 69 y.o. male came in with arm pain   VITAL SIGNS:  Blood pressure 126/62, pulse 91, temperature 97.9 F (36.6 C), temperature source Oral, resp. rate 17, height 5\' 9"  (1.753 m), weight 95.9 kg, SpO2 95 %.   PHYSICAL EXAMINATION:  GENERAL:  69 y.o.-year-old patient lying in the bed with no acute distress.  EYES: Pupils equal, round, reactive to light and accommodation. No scleral icterus. Extraocular muscles intact.  HEENT: Head atraumatic, normocephalic. Oropharynx and nasopharynx clear.  NECK:  Supple, no jugular venous distention. No thyroid enlargement, no tenderness.  LUNGS: Normal breath sounds bilaterally, no wheezing, rales,rhonchi or crepitation. No use of accessory muscles of respiration.  CARDIOVASCULAR: S1, S2 normal. No murmurs, rubs,  or gallops.  ABDOMEN: Soft, non-tender, non-distended. Bowel sounds present. No organomegaly or mass.  EXTREMITIES: No pedal edema.  Left arm in splint NEUROLOGIC: Cranial nerves II through XII are intact. Muscle strength 5/5 in all extremities. Sensation intact. Gait not checked.  PSYCHIATRIC: The patient is alert and oriented x 3.  SKIN: Bruising seen on arms and legs  DATA REVIEW:   CBC Recent Labs  Lab 11/09/19 0358  WBC 3.1*  HGB 14.0  HCT 38.7*  PLT 41*    Chemistries  Recent Labs  Lab 11/07/19 1405 11/07/19 1820 11/09/19 0358  NA  --    < > 134*  K  --    < > 3.3*  CL  --    < > 98  CO2  --    < > 29  GLUCOSE  --    < > 86  BUN  --    < > 6*  CREATININE  --    < > 0.54*  CALCIUM  --    < > 8.0*  MG  --    < > 1.6*  AST 99*  --   --   ALT 39  --   --   ALKPHOS 194*  --   --   BILITOT 4.2*  --   --    < > = values in this interval not displayed.     Microbiology Results  Results for orders placed or performed during the hospital encounter of 11/07/19  Respiratory Panel by RT PCR (Flu A&B,  Covid) - Nasopharyngeal Swab     Status: None   Collection Time: 11/07/19  3:42 PM   Specimen: Nasopharyngeal Swab  Result Value Ref Range Status   SARS Coronavirus 2 by RT PCR NEGATIVE NEGATIVE Final    Comment: (NOTE) SARS-CoV-2 target nucleic acids are NOT DETECTED. The SARS-CoV-2 RNA is generally detectable in upper respiratoy specimens during the acute phase of infection. The lowest concentration of SARS-CoV-2 viral copies this assay can detect is 131 copies/mL. A negative result does not preclude SARS-Cov-2 infection and should not be used as the sole basis for treatment or other patient management decisions. A negative result may occur with  improper specimen collection/handling, submission of specimen other than nasopharyngeal swab, presence of viral mutation(s) within the areas targeted by this assay, and inadequate number of viral copies (<131 copies/mL). A negative result must be combined with clinical observations, patient history, and epidemiological information. The expected result is Negative. Fact Sheet for Patients:  PinkCheek.be Fact Sheet for Healthcare Providers:  GravelBags.it This test is not yet ap proved or cleared by the [place] FDA and  has been authorized for detection and/or diagnosis of SARS-CoV-2 by FDA under an Emergency Use Authorization (EUA). This EUA will remain  in effect (meaning this test can be used) for the duration of the COVID-19 declaration under Section 564(b)(1) of the Act, 21 U.S.C. section 360bbb-3(b)(1), unless the authorization is terminated or revoked sooner.    Influenza A by PCR NEGATIVE NEGATIVE Final   Influenza B by PCR NEGATIVE NEGATIVE Final    Comment: (NOTE) The Xpert Xpress SARS-CoV-2/FLU/RSV assay is intended as an aid in  the diagnosis of influenza from Nasopharyngeal swab specimens and  should not be used as a sole basis for treatment. Nasal washings and   aspirates are unacceptable for Xpert Xpress SARS-CoV-2/FLU/RSV  testing. Fact Sheet for Patients: PinkCheek.be Fact Sheet for Healthcare Providers: GravelBags.it This test is not yet approved or cleared by  the [employer] and  has been authorized for detection and/or diagnosis of SARS-CoV-2 by  FDA under an Emergency Use Authorization (EUA). This EUA will remain  in effect (meaning this test can be used) for the duration of the  Covid-19 declaration under Section 564(b)(1) of the Act, 21  U.S.C. section 360bbb-3(b)(1), unless the authorization is  terminated or revoked. Performed at [facility], [address] [place], [place] [address]   C difficile quick scan w PCR reflex     Status: None   Collection Time: 11/09/19  9:33 AM   Specimen: STOOL  Result Value Ref Range Status   C Diff antigen NEGATIVE NEGATIVE Final   C Diff toxin NEGATIVE NEGATIVE Final   C Diff interpretation No C. difficile detected.  Final    Comment: Performed at [facility], [address] [place], [place] [address]    Management plans discussed with the patient and he told me he is going home.  I left a message for the patient's sister.  CODE STATUS:     Code Status Orders  (From admission, onward)         Start     Ordered   11/07/19 1807  Full code  Continuous     11/07/19 1808        Code Status History    Date Active Date Inactive Code Status Order ID Comments User Context   12/16/2018 0345 12/18/2018 1629 Full Code [id number]  Harrie Foreman, MD Inpatient   10/26/2017 1243 11/01/2017 1940 Full Code [id number]  Danne Baxter Inpatient   10/12/2017 2108 10/18/2017 1615 Full Code [id number]  Danne Baxter Inpatient   10/11/2017 2142 10/12/2017 1900 Full Code [id number]  Hessie Knows, MD ED   Advance Care Planning Activity      TOTAL TIME TAKING CARE OF THIS PATIENT: 35 minutes.    Loletha Grayer M.D  on 11/09/2019 at 5:09 PM  Between 7am to 6pm - Pager - [phone]  After 6pm go to [email or web address] - password [email or web address]  [facility]  CC: Primary care physician; Glen Chou, NP

## 2019-11-09 NOTE — TOC Transition Note (Signed)
Transition of Care [facility]) - CM/SW Discharge Note   Patient Details  [name] MRN: [id number] Date of Birth: November 28, 1950  [facility] CM/SW Contact:  Elease Hashimoto, LCSW Phone Number: 11/09/2019, 11:37 AM   Clinical Narrative:   MD say pt and feels ready for DC today. Pt declines home health services and has all needed equipment. He plans to do what he wants like he always has and declines substance abuse resources. Will sign off ready for DC once MD paperwork completed.    Final next level of care: Home/Self Care Barriers to Discharge: Barriers Resolved   Patient Goals and CMS Choice Patient states their goals for this hospitalization and ongoing recovery are:: I need to get out of here I am not used to being inside. I'm an outside guy      Discharge Placement                Patient to be transferred to facility by: HOme by son or girlfriend Name of family member notified: Son Patient and family notified of of transfer: 11/09/19  Discharge Plan and Services In-house Referral: Clinical Social Work                                   Social Determinants of Health (SDOH) Interventions     Readmission Risk Interventions No flowsheet data found.

## 2019-11-09 NOTE — Discharge Instructions (Signed)
Radial Fracture  A radial fracture is a break in the radius bone. The radius is a bone in the forearm, on the same side as the thumb. The forearm is the part of the arm that is between the elbow and the wrist. A radial fracture near the wrist (distal radialfracture) is the most common type of broken arm. A fracture can also occur near the elbow (radial head fracture). What are the causes? The most common cause of a radial fracture is falling with the arm outstretched. Other causes include:  An accident, such as a car or bike accident.  A hard, direct hit to the forearm. What increases the risk? You may be at greater risk for a radial fracture if you:  Are male.  Are an older adult.  Play contact sports.  Have a condition that causes your bones to become thin and brittle (osteoporosis). What are the signs or symptoms? A radial fracture causes pain immediately after the injury. Other signs and symptoms may include:  An abnormal bend or bump in the arm (deformity).  Swelling.  Bruising.  Numbness or tingling in your arm and hand.  Limited movement of your arm and hand. How is this diagnosed? This condition may be diagnosed based on:  Your symptoms and medical history.  A physical exam.  An X-ray. How is this treated? Treatment depends on how severe your fracture is, where it is, and how the pieces of the broken bone line up with each other (alignment).  The first step may be for you to wear a temporary splint for a few days, until your swelling goes down. After the swelling goes down, you may get a cast, get a different type of splint, or have surgery.  If your broken bone is in good alignment, you will need to wear a splint or cast for up to 6 weeks.  If your broken bone is not aligned (is displaced), your health care provider will need to align the bone pieces. After alignment, you will need to wear a splint or cast for up to 6 weeks. To align your broken bone, your  health care provider may: ? Move the bones back into position without surgery (closed reduction). ? Perform surgery to align the fracture and fix the bone pieces into place with metal screws, plates, or wires (open reduction and internal fixation, ORIF). ? Perform surgery to align the fracture and fix the bone pieces into place with pins that are attached to a stabilizing bar outside your skin (external fixation). Treatment may also include:  Having your cast changed after 2-3 weeks.  Physical therapy.  Follow-up visits and X-rays to make sure you are healing. Follow these instructions at home: If you have a splint:  Wear it as told by your health care provider. Remove it only as told by your health care provider.  Loosen the splint if your fingers tingle, become numb, or turn cold and blue.  Keep the splint clean and dry. If you have a cast:  Do not stick anything inside the cast to scratch your skin. Doing that increases your risk for infection.  Check the skin around the cast every day. Tell your health care provider about any concerns.  You may put lotion on dry skin around the edges of the cast. Do not put lotion on the skin underneath the cast.  Keep the cast clean and dry. Bathing  Do not take baths, swim, or use a hot tub until your health care  provider approves. Ask your health care provider if you may take showers. You may only be allowed to take sponge baths.  If your splint or cast is not waterproof: ? Do not let it get wet. ? Cover it with a watertight covering when you take a bath or a shower. Activity  Do not lift anything with your injured arm.  Do not use the injured arm to support your body weight until your health care provider says that you can.  Ask your health care provider what activities are safe for you during recovery, and ask what activities you need to avoid. Managing pain, stiffness, and swelling   If directed, put ice on painful areas: ? If  you have a removable splint, remove it as told by your health care provider. ? Put ice in a plastic bag. ? Place a towel between your skin and the bag, or between your cast and the bag. ? Leave the ice on for 20 minutes, 2-3 times a day.  Move your fingers often to avoid stiffness and to lessen swelling.  Raise (elevate) your arm above the level of your heart while you are sitting or lying down. General instructions  Do not put pressure on any part of the cast or splint until it is fully hardened, if applicable. This may take several hours.  Take over-the-counter and prescription medicines only as told by your health care provider.  Do not drive until your health care provider approves. You should not drive or use heavy machinery while taking prescription pain medicine.  Do not use any products that contain nicotine or tobacco, such as cigarettes and e-cigarettes. These can delay bone healing. If you need help quitting, ask your health care provider.  Keep all follow-up visits as told by your health care provider. This is important. Contact a health care provider if you have:  Pain that does not get better with medicine.  Swelling that gets worse.  A bad smell coming from your cast. Get help right away if:  You cannot move your fingers.  You have severe pain.  Your fingers or your hand: ? Become numb, cold, or pale. ? Turn a bluish color. Summary  A radial fracture is a break in the radius bone. The radius is in the forearm, on the same side as the thumb.  Treatment depends on how severe your fracture is, where it is, and how the pieces of the broken bone line up with each other.  A splint or cast may be needed to help the fracture heal. A more severe break may require surgery. This information is not intended to replace advice given to you by your health care provider. Make sure you discuss any questions you have with your health care provider. Document Revised: 09/07/2017  Document Reviewed: 09/07/2017 Elsevier Patient Education  2020 [employer].

## 2019-11-21 ENCOUNTER — Other Ambulatory Visit: Payer: Self-pay | Admitting: Orthopedic Surgery

## 2019-11-21 ENCOUNTER — Other Ambulatory Visit
Admission: RE | Admit: 2019-11-21 | Discharge: 2019-11-21 | Disposition: A | Discharge status: Home / Self Care | Payer: Medicare Other | Source: Ambulatory Visit | Attending: Orthopedic Surgery | Admitting: Orthopedic Surgery

## 2019-11-21 DIAGNOSIS — Z20822 Contact with and (suspected) exposure to covid-19: Secondary | ICD-10-CM | POA: Insufficient documentation

## 2019-11-21 DIAGNOSIS — Z01812 Encounter for preprocedural laboratory examination: Secondary | ICD-10-CM | POA: Insufficient documentation

## 2019-11-22 ENCOUNTER — Ambulatory Visit: Payer: Medicare Other | Admitting: Anesthesiology

## 2019-11-22 ENCOUNTER — Ambulatory Visit
Admission: RE | Admit: 2019-11-22 | Discharge: 2019-11-22 | Disposition: A | Discharge status: Home / Self Care | Payer: Medicare Other | Attending: Orthopedic Surgery | Admitting: Orthopedic Surgery

## 2019-11-22 ENCOUNTER — Encounter
Admission: RE | Disposition: A | Discharge status: Home / Self Care | Payer: Self-pay | Source: Home / Self Care | Attending: Orthopedic Surgery

## 2019-11-22 ENCOUNTER — Ambulatory Visit: Payer: Medicare Other

## 2019-11-22 ENCOUNTER — Encounter: Payer: Self-pay | Admitting: Orthopedic Surgery

## 2019-11-22 ENCOUNTER — Other Ambulatory Visit: Payer: Self-pay

## 2019-11-22 DIAGNOSIS — M1990 Unspecified osteoarthritis, unspecified site: Secondary | ICD-10-CM | POA: Diagnosis not present

## 2019-11-22 DIAGNOSIS — X58XXXA Exposure to other specified factors, initial encounter: Secondary | ICD-10-CM | POA: Insufficient documentation

## 2019-11-22 DIAGNOSIS — Z9889 Other specified postprocedural states: Secondary | ICD-10-CM

## 2019-11-22 DIAGNOSIS — Z79899 Other long term (current) drug therapy: Secondary | ICD-10-CM | POA: Insufficient documentation

## 2019-11-22 DIAGNOSIS — Z7952 Long term (current) use of systemic steroids: Secondary | ICD-10-CM | POA: Insufficient documentation

## 2019-11-22 DIAGNOSIS — F419 Anxiety disorder, unspecified: Secondary | ICD-10-CM | POA: Diagnosis not present

## 2019-11-22 DIAGNOSIS — Z79891 Long term (current) use of opiate analgesic: Secondary | ICD-10-CM | POA: Diagnosis not present

## 2019-11-22 DIAGNOSIS — Z885 Allergy status to narcotic agent status: Secondary | ICD-10-CM | POA: Insufficient documentation

## 2019-11-22 DIAGNOSIS — F17200 Nicotine dependence, unspecified, uncomplicated: Secondary | ICD-10-CM | POA: Diagnosis not present

## 2019-11-22 DIAGNOSIS — J449 Chronic obstructive pulmonary disease, unspecified: Secondary | ICD-10-CM | POA: Insufficient documentation

## 2019-11-22 DIAGNOSIS — Z9981 Dependence on supplemental oxygen: Secondary | ICD-10-CM | POA: Diagnosis not present

## 2019-11-22 DIAGNOSIS — S52572A Other intraarticular fracture of lower end of left radius, initial encounter for closed fracture: Secondary | ICD-10-CM | POA: Diagnosis present

## 2019-11-22 DIAGNOSIS — Z8781 Personal history of (healed) traumatic fracture: Secondary | ICD-10-CM

## 2019-11-22 DIAGNOSIS — G40909 Epilepsy, unspecified, not intractable, without status epilepticus: Secondary | ICD-10-CM | POA: Diagnosis not present

## 2019-11-22 HISTORY — PX: OPEN REDUCTION INTERNAL FIXATION (ORIF) DISTAL RADIAL FRACTURE: SHX5989

## 2019-11-22 LAB — URINE DRUG SCREEN, QUALITATIVE (ARMC ONLY)
Amphetamines, Ur Screen: NOT DETECTED
Barbiturates, Ur Screen: POSITIVE — AB
Benzodiazepine, Ur Scrn: POSITIVE — AB
Cannabinoid 50 Ng, Ur ~~LOC~~: NOT DETECTED
Cocaine Metabolite,Ur ~~LOC~~: NOT DETECTED
MDMA (Ecstasy)Ur Screen: NOT DETECTED
Methadone Scn, Ur: NOT DETECTED
Opiate, Ur Screen: NOT DETECTED
Phencyclidine (PCP) Ur S: NOT DETECTED
Tricyclic, Ur Screen: NOT DETECTED

## 2019-11-22 LAB — SARS CORONAVIRUS 2 (TAT 6-24 HRS): SARS Coronavirus 2: NEGATIVE

## 2019-11-22 SURGERY — OPEN REDUCTION INTERNAL FIXATION (ORIF) DISTAL RADIUS FRACTURE
Anesthesia: General | Site: Wrist | Laterality: Left

## 2019-11-22 MED ORDER — ONDANSETRON HCL 4 MG/2ML IJ SOLN
INTRAMUSCULAR | Status: AC
  Filled 2019-11-22: qty 2

## 2019-11-22 MED ORDER — FENTANYL CITRATE (PF) 100 MCG/2ML IJ SOLN
INTRAMUSCULAR | Status: AC
  Filled 2019-11-22: qty 2

## 2019-11-22 MED ORDER — CHLORHEXIDINE GLUCONATE 4 % EX LIQD
60.0000 mL | Freq: Once | CUTANEOUS | Status: AC
  Administered 2019-11-22: 4 via TOPICAL

## 2019-11-22 MED ORDER — DEXAMETHASONE SODIUM PHOSPHATE 10 MG/ML IJ SOLN
INTRAMUSCULAR | Status: AC
  Filled 2019-11-22: qty 1

## 2019-11-22 MED ORDER — DEXAMETHASONE SODIUM PHOSPHATE 10 MG/ML IJ SOLN
INTRAMUSCULAR | Status: DC | PRN
  Administered 2019-11-22: 10 mg via INTRAVENOUS

## 2019-11-22 MED ORDER — CEFAZOLIN SODIUM-DEXTROSE 2-4 GM/100ML-% IV SOLN
2.0000 g | INTRAVENOUS | Status: AC
  Administered 2019-11-22: 2 g via INTRAVENOUS

## 2019-11-22 MED ORDER — OXYCODONE HCL 5 MG/5ML PO SOLN: 5.0000 mg | Freq: Once | ORAL | Status: AC | PRN

## 2019-11-22 MED ORDER — FAMOTIDINE 20 MG PO TABS: 20.0000 mg | ORAL_TABLET | Freq: Once | ORAL | Status: AC

## 2019-11-22 MED ORDER — OXYCODONE HCL 5 MG PO TABS: 5.0000 mg | ORAL_TABLET | Freq: Once | ORAL | Status: AC | PRN

## 2019-11-22 MED ORDER — FENTANYL CITRATE (PF) 100 MCG/2ML IJ SOLN
INTRAMUSCULAR | Status: DC | PRN
  Administered 2019-11-22 (×2): 50 ug via INTRAVENOUS
  Administered 2019-11-22 (×2): 25 ug via INTRAVENOUS

## 2019-11-22 MED ORDER — FAMOTIDINE 20 MG PO TABS
ORAL_TABLET | ORAL | Status: AC
  Administered 2019-11-22: 20 mg via ORAL
  Filled 2019-11-22: qty 1

## 2019-11-22 MED ORDER — LIDOCAINE HCL (PF) 2 % IJ SOLN
INTRAMUSCULAR | Status: AC
  Filled 2019-11-22: qty 5

## 2019-11-22 MED ORDER — MIDAZOLAM HCL 2 MG/2ML IJ SOLN
INTRAMUSCULAR | Status: AC
  Filled 2019-11-22: qty 2

## 2019-11-22 MED ORDER — LACTATED RINGERS IV SOLN: INTRAVENOUS | Status: DC

## 2019-11-22 MED ORDER — MIDAZOLAM HCL 2 MG/2ML IJ SOLN
INTRAMUSCULAR | Status: DC | PRN
  Administered 2019-11-22: 2 mg via INTRAVENOUS

## 2019-11-22 MED ORDER — FENTANYL CITRATE (PF) 100 MCG/2ML IJ SOLN
25.0000 ug | INTRAMUSCULAR | Status: DC | PRN
  Administered 2019-11-22 (×4): 25 ug via INTRAVENOUS

## 2019-11-22 MED ORDER — LIDOCAINE HCL (CARDIAC) PF 100 MG/5ML IV SOSY
PREFILLED_SYRINGE | INTRAVENOUS | Status: DC | PRN
  Administered 2019-11-22: 100 mg via INTRAVENOUS

## 2019-11-22 MED ORDER — FENTANYL CITRATE (PF) 100 MCG/2ML IJ SOLN
INTRAMUSCULAR | Status: AC
  Administered 2019-11-22: 25 ug via INTRAVENOUS
  Filled 2019-11-22: qty 2

## 2019-11-22 MED ORDER — CEFAZOLIN SODIUM-DEXTROSE 2-4 GM/100ML-% IV SOLN
INTRAVENOUS | Status: AC
  Filled 2019-11-22: qty 100

## 2019-11-22 MED ORDER — PROPOFOL 10 MG/ML IV BOLUS
INTRAVENOUS | Status: DC | PRN
  Administered 2019-11-22: 150 mg via INTRAVENOUS
  Administered 2019-11-22: 50 mg via INTRAVENOUS

## 2019-11-22 MED ORDER — OXYCODONE HCL 5 MG PO TABS
ORAL_TABLET | ORAL | Status: AC
  Administered 2019-11-22: 5 mg via ORAL
  Filled 2019-11-22: qty 1

## 2019-11-22 MED ORDER — DEXMEDETOMIDINE HCL 200 MCG/2ML IV SOLN
INTRAVENOUS | Status: DC | PRN
  Administered 2019-11-22: 8 ug via INTRAVENOUS

## 2019-11-22 MED ORDER — OXYCODONE HCL 5 MG PO TABS: 5.0000 mg | ORAL_TABLET | ORAL | 0 refills | Status: DC | PRN

## 2019-11-22 MED ORDER — ONDANSETRON HCL 4 MG/2ML IJ SOLN
INTRAMUSCULAR | Status: DC | PRN
  Administered 2019-11-22: 4 mg via INTRAVENOUS

## 2019-11-22 SURGICAL SUPPLY — 40 items
BNDG ELASTIC 4X5.8 VLCR STR LF (GAUZE/BANDAGES/DRESSINGS) ×3 IMPLANT
CANISTER SUCT 1200ML W/VALVE (MISCELLANEOUS) ×3 IMPLANT
CHLORAPREP W/TINT 26 (MISCELLANEOUS) ×3 IMPLANT
COVER WAND RF STERILE (DRAPES) ×3 IMPLANT
CUFF TOURN SGL QUICK 18X4 (TOURNIQUET CUFF) ×2 IMPLANT
DRAPE FLUOR MINI C-ARM 54X84 (DRAPES) ×3 IMPLANT
ELECT REM PT RETURN 9FT ADLT (ELECTROSURGICAL) ×3
ELECTRODE REM PT RTRN 9FT ADLT (ELECTROSURGICAL) ×1 IMPLANT
GAUZE SPONGE 4X4 12PLY STRL (GAUZE/BANDAGES/DRESSINGS) ×3 IMPLANT
GAUZE XEROFORM 1X8 LF (GAUZE/BANDAGES/DRESSINGS) ×6 IMPLANT
GLOVE SURG SYN 9.0  PF PI (GLOVE) ×2
GLOVE SURG SYN 9.0 PF PI (GLOVE) ×1 IMPLANT
GOWN SRG 2XL LVL 4 RGLN SLV (GOWNS) ×1 IMPLANT
GOWN STRL NON-REIN 2XL LVL4 (GOWNS) ×2
GOWN STRL REUS W/ TWL LRG LVL3 (GOWN DISPOSABLE) ×1 IMPLANT
GOWN STRL REUS W/TWL LRG LVL3 (GOWN DISPOSABLE) ×2
K-WIRE 1.6 (WIRE) ×4
K-WIRE FX5X1.6XNS BN SS (WIRE) ×2
KIT TURNOVER KIT A (KITS) ×3 IMPLANT
KWIRE FX5X1.6XNS BN SS (WIRE) IMPLANT
NDL FILTER BLUNT 18X1 1/2 (NEEDLE) ×1 IMPLANT
NEEDLE FILTER BLUNT 18X 1/2SAF (NEEDLE) ×2
NEEDLE FILTER BLUNT 18X1 1/2 (NEEDLE) ×1 IMPLANT
NS IRRIG 500ML POUR BTL (IV SOLUTION) ×3 IMPLANT
PACK EXTREMITY ARMC (MISCELLANEOUS) ×3 IMPLANT
PAD CAST CTTN 4X4 STRL (SOFTGOODS) ×2 IMPLANT
PADDING CAST COTTON 4X4 STRL (SOFTGOODS) ×4
PEG SUBCHONDRAL SMOOTH 2.0X14 (Peg) ×4 IMPLANT
PEG SUBCHONDRAL SMOOTH 2.0X16 (Peg) ×4 IMPLANT
PEG SUBCHONDRAL SMOOTH 2.0X20 (Peg) ×6 IMPLANT
PLATE STAN 24.4X59.5 LT (Plate) ×2 IMPLANT
SCALPEL PROTECTED #15 DISP (BLADE) ×6 IMPLANT
SCREW BN 12X3.5XNS CORT TI (Screw) IMPLANT
SCREW CORT 3.5X12 (Screw) ×8 IMPLANT
SPLINT CAST 1 STEP 3X12 (MISCELLANEOUS) ×3 IMPLANT
SUT ETHILON 4-0 (SUTURE) ×2
SUT ETHILON 4-0 FS2 18XMFL BLK (SUTURE) ×1
SUT VICRYL 3-0 27IN (SUTURE) ×3 IMPLANT
SUTURE ETHLN 4-0 FS2 18XMF BLK (SUTURE) ×1 IMPLANT
SYR 3ML LL SCALE MARK (SYRINGE) ×3 IMPLANT

## 2019-11-22 NOTE — H&P (Signed)
Reviewed paper H+P, will be scanned into chart. No changes noted.  

## 2019-11-22 NOTE — Discharge Instructions (Addendum)
Keep arm elevated is much as possible.  Work on finger motion is much as you can.  Pain medicine as directed.  Loosen Ace wrap if fingers swell a great deal.  Call office if you were having problems   Oxycodone 5mg  IR taken Feb 25 at 3:55pm  [place]   1) The drugs that you were given will stay in your system until tomorrow so for the next 24 hours you should not:  A) Drive an automobile B) Make any legal decisions C) Drink any alcoholic beverage   2) You may resume regular meals tomorrow.  Today it is better to start with liquids and gradually work up to solid foods.  You may eat anything you prefer, but it is better to start with liquids, then soup and crackers, and gradually work up to solid foods.   3) Please notify your doctor immediately if you have any unusual bleeding, trouble breathing, redness and pain at the surgery site, drainage, fever, or pain not relieved by medication.    4) Additional Instructions:        Please contact your physician with any problems or Same Day Surgery at [phone], Monday through Friday 6 am to 4 pm, or [place] at [facility] number at [phone].

## 2019-11-22 NOTE — Pre-Procedure Instructions (Signed)
Reported from PACU there was some blood on LMA was removed. Pt got to room and coughed up small amount of dark blood. He kept saying he felt something was in his throat. Once coughed up he said he felt better. Pt drank some water and had no more issues

## 2019-11-22 NOTE — Transfer of Care (Signed)
Immediate Anesthesia Transfer of Care Note  Patient: Glen Colon  Procedure(s) Performed: OPEN REDUCTION INTERNAL FIXATION (ORIF) DISTAL RADIAL FRACTURE (Left Wrist)  Patient Location: PACU  Anesthesia Type:General  Level of Consciousness: sedated  Airway & Oxygen Therapy: Patient Spontanous Breathing and Patient connected to face mask oxygen  Post-op Assessment: Report given to RN and Post -op Vital signs reviewed and stable  Post vital signs: Reviewed and stable  Last Vitals:  Vitals Value Taken Time  BP 113/73 11/22/19 1458  Temp    Pulse 92 11/22/19 1458  Resp 13 11/22/19 1458  SpO2 88 % 11/22/19 1458  Vitals shown include unvalidated device data.  Last Pain:  Vitals:   11/22/19 1458  TempSrc:   PainSc: 0-No pain         Complications: No apparent anesthesia complications

## 2019-11-22 NOTE — Anesthesia Preprocedure Evaluation (Signed)
Anesthesia Evaluation  Patient identified by MRN, date of birth, ID band Patient awake    Reviewed: Allergy & Precautions, H&P , NPO status , Patient's Chart, lab work & pertinent test results  History of Anesthesia Complications Negative for: history of anesthetic complications  Airway Mallampati: III  TM Distance: >3 FB Neck ROM: full    Dental  (+) Chipped, Poor Dentition, Missing, Edentulous Lower   Pulmonary pneumonia, COPD,  COPD inhaler and oxygen dependent, Current Smoker,           Cardiovascular Exercise Tolerance: Good (-) angina+CHF  (-) Past MI and (-) DOE negative cardio ROS       Neuro/Psych Seizures -,  PSYCHIATRIC DISORDERS    GI/Hepatic negative GI ROS, hiatal hernia, (+) Hepatitis -, C  Endo/Other  negative endocrine ROS  Renal/GU      Musculoskeletal  (+) Arthritis ,   Abdominal   Peds  Hematology negative hematology ROS (+)   Anesthesia Other Findings Past Medical History: 10/13/2017: Alcohol dependence ([place]) No date: Anemia No date: Angina at rest [facility])     Comment:  "take an aspirin & it goes right away" (10/26/2017) No date: Anxiety No date: Arthritis     Comment:  "joints hurt; especially on cold days" (10/26/2017) No date: Chronic bronchitis ([place]) 10/13/2017: Cirrhosis ([place]) No date: COPD (chronic obstructive pulmonary disease) ([place]) 2018: Epilepsy ([place])     Comment:  "no sz if I take RX q night at bedtime" (10/26/2017) No date: Hepatitis C, chronic ([place])     Comment:  "tried tx in 2018; the RX gave me seizures; couldn't               walk for 3 days; never finished the the tx" (10/26/2017) No date: History of hiatal hernia 10/18/2017: Hypogonadism in male 10/13/2017: Marijuana abuse 1995: Pneumonia 2018: Seizure ([place])     Comment:  "related to hepatitis RX" (10/26/2017) No date: Tobacco abuse 10/18/2017: Vitamin D deficiency  Past Surgical History: No date:  ESOPHAGOGASTRODUODENOSCOPY 10/13/2017: EXTERNAL FIXATION LEG; Right     Comment:  Procedure: EXTERNAL FIXATION LEG;  Surgeon: Altamese [place], MD;  Location: [place];  Service: Orthopedics;                Laterality: Right; No date: FRACTURE SURGERY 10/13/2017: I & D EXTREMITY; Right     Comment:  Procedure: REPEAT IRRIGATION AND DEBRIDEMENT EXTREMITY               WITH ADJUSTMENT OF EXTERNAL FIXITOR;  Surgeon: Altamese [place], MD;  Location: [place];  Service: Orthopedics;                Laterality: Right; 10/13/2017: INTRAMEDULLARY (IM) NAIL FIBULA; Right     Comment:  Procedure: INTRAMEDULLARY (IM) NAIL FIBULA;  Surgeon:               Altamese [place]ley, MD;  Location: [place];  Service:               Orthopedics;  Laterality: Right; No date: LIVER CYST REMOVAL 1995: LUNG SURGERY     Comment:  [place], "walking pna" 10/11/2017: ORIF ANKLE FRACTURE; Right     Comment:  Procedure: I & D right ankle with application of               external  fixator and wound vac;  Surgeon: Hessie Knows,               MD;  Location: [facility];  Service: Orthopedics;                Laterality: Right;     Reproductive/Obstetrics negative OB ROS                             Anesthesia Physical Anesthesia Plan  ASA: III  Anesthesia Plan: General LMA   Post-op Pain Management:    Induction: Intravenous  PONV Risk Score and Plan: Dexamethasone, Ondansetron, Midazolam and Treatment may vary due to age or medical condition  Airway Management Planned: LMA  Additional Equipment:   Intra-op Plan:   Post-operative Plan: Extubation in OR  Informed Consent: I have reviewed the patients History and Physical, chart, labs and discussed the procedure including the risks, benefits and alternatives for the proposed anesthesia with the patient or authorized representative who has indicated his/her understanding and acceptance.     Dental Advisory Given  Plan  Discussed with: Anesthesiologist, CRNA and Surgeon  Anesthesia Plan Comments: (Patient consented for risks of anesthesia including but not limited to:  - adverse reactions to medications - damage to teeth, lips or other oral mucosa - sore throat or hoarseness - Damage to heart, brain, lungs or loss of life  Patient voiced understanding.)        Anesthesia Quick Evaluation

## 2019-11-22 NOTE — Op Note (Signed)
11/22/2019  2:56 PM  PATIENT:  Glen Colon  69 y.o. male  PRE-OPERATIVE DIAGNOSIS:  Closed fracture of distal end of left radius, unspecified fracture morphology  POST-OPERATIVE DIAGNOSIS:  Closed fracture of distal end of left radius, unspecified fracture morphology  PROCEDURE:  Procedure(s): OPEN REDUCTION INTERNAL FIXATION (ORIF) DISTAL RADIAL FRACTURE (Left)  SURGEON: Laurene Footman, MD  ASSISTANTS: none  ANESTHESIA:   general  EBL:  Total I/O In: 700 [I.V.:700] Out: 10 [Blood:10]  BLOOD ADMINISTERED:none  DRAINS: none   LOCAL MEDICATIONS USED:  NONE  SPECIMEN:  No Specimen  DISPOSITION OF SPECIMEN:  N/A  COUNTS:  YES  TOURNIQUET:   Total Tourniquet Time Documented: Upper Arm (Left) - 27 minutes Total: Upper Arm (Left) - 27 minutes   IMPLANTS: [employer] hand [employer] standard width and length plate with multiple smooth pegs and screws  DICTATION: .Dragon Dictation   patient was brought to the operating room and after general anesthesia had been obtained  The left arm was prepped and draped in the usual sterile fashion and appropriate patient identification and timeout procedures were completed.  Tourniquet was raised to 250 mmHg and volar approach made centered over the FCR tendon.  The tendon sheath was incised and the tendon retracted radially protect the radial artery and associated veins.  The deep fascia was incised and the muscle retracted ulnarly exposing the pronator which then elevated off the radial border of both fragments.  Traction was applied to the start of the case and this helped restore length.  Proximal l first approach was then used after getting adequate reduction pinning the plate position making sure was appropriate position all smooth pegs were filled using standard technique drilling measuring and placing the smooth pegs.  The 4 screw holes on the plate were first brought to the shaft using 12 mm cortical screws with near anatomical alignment  obtained.  Traction was removed and under fluoroscopic views the fracture was quite stable.  Wound is irrigated and tourniquet let down followed by closure with 3-0 Vicryl in the subcutaneous tissue and 4-0 nylon in a similar fashion to the skin.  Xeroform 4 x 4 web roll volar splint and Ace wrap applied  PLAN OF CARE: Discharge to home after PACU  PATIENT DISPOSITION:  PACU - hemodynamically stable.

## 2019-11-22 NOTE — Anesthesia Procedure Notes (Signed)
Procedure Name: LMA Insertion Date/Time: 11/22/2019 1:56 PM Performed by: Aline Brochure, CRNA Pre-anesthesia Checklist: Patient identified, Patient being monitored, Timeout performed, Emergency Drugs available and Suction available Patient Re-evaluated:Patient Re-evaluated prior to induction Oxygen Delivery Method: Circle system utilized Preoxygenation: Pre-oxygenation with 100% oxygen Induction Type: IV induction Ventilation: Mask ventilation without difficulty LMA: LMA inserted LMA Size: 5.0 Tube type: Oral Number of attempts: 1 Placement Confirmation: positive ETCO2 and breath sounds checked- equal and bilateral Tube secured with: Tape Dental Injury: Teeth and Oropharynx as per pre-operative assessment

## 2019-11-23 NOTE — Anesthesia Postprocedure Evaluation (Signed)
Anesthesia Post Note  Patient: EDGARD BROOMFIELD  Procedure(s) Performed: OPEN REDUCTION INTERNAL FIXATION (ORIF) DISTAL RADIAL FRACTURE (Left Wrist)  Patient location during evaluation: PACU Anesthesia Type: General Level of consciousness: awake and alert Pain management: pain level controlled Vital Signs Assessment: post-procedure vital signs reviewed and stable Respiratory status: spontaneous breathing, nonlabored ventilation, respiratory function stable and patient connected to nasal cannula oxygen Cardiovascular status: blood pressure returned to baseline and stable Postop Assessment: no apparent nausea or vomiting Anesthetic complications: no     Last Vitals:  Vitals:   11/22/19 1557 11/22/19 1612  BP:  108/72  Pulse:    Resp: 18 19  Temp:  36.6 C  SpO2: 95% 92%    Last Pain:  Vitals:   11/22/19 1612  TempSrc:   PainSc: 3                  Precious Haws Grecia Lynk

## 2020-02-19 ENCOUNTER — Other Ambulatory Visit: Payer: Self-pay

## 2020-02-19 ENCOUNTER — Ambulatory Visit: Payer: Medicare Other | Attending: Family | Admitting: Family

## 2020-02-19 ENCOUNTER — Telehealth: Payer: Self-pay | Admitting: Family

## 2020-02-19 ENCOUNTER — Encounter: Payer: Self-pay | Admitting: Family

## 2020-02-19 VITALS — BP 93/72 | HR 53 | Resp 20 | Ht 70.0 in | Wt 204.0 lb

## 2020-02-19 DIAGNOSIS — F17210 Nicotine dependence, cigarettes, uncomplicated: Secondary | ICD-10-CM | POA: Diagnosis not present

## 2020-02-19 DIAGNOSIS — Z7289 Other problems related to lifestyle: Secondary | ICD-10-CM | POA: Diagnosis not present

## 2020-02-19 DIAGNOSIS — Z79899 Other long term (current) drug therapy: Secondary | ICD-10-CM | POA: Insufficient documentation

## 2020-02-19 DIAGNOSIS — B182 Chronic viral hepatitis C: Secondary | ICD-10-CM | POA: Insufficient documentation

## 2020-02-19 DIAGNOSIS — I5032 Chronic diastolic (congestive) heart failure: Secondary | ICD-10-CM | POA: Insufficient documentation

## 2020-02-19 DIAGNOSIS — J449 Chronic obstructive pulmonary disease, unspecified: Secondary | ICD-10-CM | POA: Diagnosis not present

## 2020-02-19 DIAGNOSIS — G40909 Epilepsy, unspecified, not intractable, without status epilepticus: Secondary | ICD-10-CM | POA: Insufficient documentation

## 2020-02-19 DIAGNOSIS — I890 Lymphedema, not elsewhere classified: Secondary | ICD-10-CM | POA: Insufficient documentation

## 2020-02-19 DIAGNOSIS — Z7951 Long term (current) use of inhaled steroids: Secondary | ICD-10-CM | POA: Diagnosis not present

## 2020-02-19 DIAGNOSIS — F419 Anxiety disorder, unspecified: Secondary | ICD-10-CM | POA: Diagnosis not present

## 2020-02-19 DIAGNOSIS — K7460 Unspecified cirrhosis of liver: Secondary | ICD-10-CM | POA: Insufficient documentation

## 2020-02-19 DIAGNOSIS — E559 Vitamin D deficiency, unspecified: Secondary | ICD-10-CM | POA: Diagnosis not present

## 2020-02-19 DIAGNOSIS — M1990 Unspecified osteoarthritis, unspecified site: Secondary | ICD-10-CM | POA: Insufficient documentation

## 2020-02-19 DIAGNOSIS — I509 Heart failure, unspecified: Secondary | ICD-10-CM | POA: Diagnosis not present

## 2020-02-19 DIAGNOSIS — K7030 Alcoholic cirrhosis of liver without ascites: Secondary | ICD-10-CM

## 2020-02-19 LAB — BASIC METABOLIC PANEL
Anion gap: 14 (ref 5–15)
BUN: 8 mg/dL (ref 8–23)
CO2: 30 mmol/L (ref 22–32)
Calcium: 8.1 mg/dL — ABNORMAL LOW (ref 8.9–10.3)
Chloride: 84 mmol/L — ABNORMAL LOW (ref 98–111)
Creatinine, Ser: 0.81 mg/dL (ref 0.61–1.24)
GFR calc Af Amer: >60 mL/min (ref >60)
GFR calc non Af Amer: >60 mL/min (ref >60)
Glucose, Bld: 102 mg/dL — ABNORMAL HIGH (ref 70–99)
Potassium: 2.4 mmol/L — CL (ref 3.5–5.1)
Sodium: 128 mmol/L — ABNORMAL LOW (ref 135–145)

## 2020-02-19 LAB — MAGNESIUM: Magnesium: 1.6 mg/dL — ABNORMAL LOW (ref 1.7–2.4)

## 2020-02-19 MED ORDER — METOPROLOL SUCCINATE ER 25 MG PO TB24: 12.5000 mg | ORAL_TABLET | Freq: Every day | ORAL | 5 refills | Status: AC

## 2020-02-19 MED ORDER — SPIRONOLACTONE 25 MG PO TABS
25.0000 mg | ORAL_TABLET | Freq: Every day | ORAL | 5 refills | Status: DC
Start: 2020-02-19 — End: 2020-03-04

## 2020-02-19 MED ORDER — MAGNESIUM OXIDE 400 MG PO CAPS: 400.0000 mg | ORAL_CAPSULE | Freq: Every day | ORAL | 5 refills | Status: AC

## 2020-02-19 MED ORDER — FUROSEMIDE 40 MG PO TABS: 40.0000 mg | ORAL_TABLET | Freq: Two times a day (BID) | ORAL | 5 refills | Status: AC

## 2020-02-19 NOTE — Progress Notes (Signed)
[place] - PHARMACIST COUNSELING NOTE  ADHERENCE ASSESSMENT  Adherence strategy: Patient brought medications to visit today. He takes medications from the pill bottles.   Do you ever forget to take your medication? [] Yes (1) [x] No (0)  Do you ever skip doses due to side effects? [x] Yes (1) [] No (0)  Do you have trouble affording your medicines? [] Yes (1) [x] No (0)  Are you ever unable to pick up your medication due to transportation difficulties? [] Yes (1) [x] No (0)  Do you ever stop taking your medications because you don't believe they are helping? [] Yes (1) [x] No (0)  Total score 0    Recommendations given to patient about increasing adherence: Patient endorses GI upset with potassium and has not been taking supplementation along with diuretic therapy. He was also unsure as to why he had magnesium and thus was not taking this either. He was out of metoprolol succinate at today's visit. Counseled patient on importance of taking supplementation while on diuretics.  Guideline-Directed Medical Therapy/Evidence Based Medicine  ACE/ARB/ARNI: None Beta Blocker: Metoprolol succinate 25 mg daily Aldosterone Antagonist: None Diuretic: Furosemide 40 mg BID (patient reports he has been taking 40 mg TID)    SUBJECTIVE  HPI: Patient is a 69 y/o M with PMH as below who presents to CHF clinic for follow-up. He was recently hospitalized 2/10 - 2/12 with left distal radius fracture and hyponatremia in the setting of cirrhosis. His last CHF clinic visit was 12/25/2018 after which he was lost to follow-up.  Past Medical History:  Diagnosis Date  . Alcohol dependence ([place]) 10/13/2017  . Anemia   . Angina at rest [facility])    "take an aspirin & it goes right away" (10/26/2017)  . Anxiety   . Arthritis    "joints hurt; especially on cold days" (10/26/2017)  . CHF (congestive heart failure) ([place])   . Chronic bronchitis ([place])   . Cirrhosis ([place]) 10/13/2017   . COPD (chronic obstructive pulmonary disease) ([place])   . Epilepsy ([place]) 2018   "no sz if I take RX q night at bedtime" (10/26/2017)  . Hepatitis C, chronic ([place])    "tried tx in 2018; the RX gave me seizures; couldn't walk for 3 days; never finished the the tx" (10/26/2017)  . History of hiatal hernia   . Hypogonadism in male 10/18/2017  . Marijuana abuse 10/13/2017  . Pneumonia 1995  . Seizure ([place]) 2018   "related to hepatitis RX" (10/26/2017)  . Tobacco abuse   . Vitamin D deficiency 10/18/2017    OBJECTIVE   Vital signs: HR 53, BP 93/72, weight (pounds) 204 ECHO: Date 12/16/18, EF 60-65%, notes: LV diastolic parameters normal. Moderately reduced RV systolic function, RV cavity severely enlarged, RV systolic pressure moderately elevated. LA mildly dilated, RA mildly dilated.  BMP Latest Ref Rng & Units 11/09/2019 11/08/2019 11/08/2019  Glucose 70 - 99 mg/dL 86 150(H) 111(H)  BUN 8 - 23 mg/dL 6(L) 7(L) 8  Creatinine 0.61 - 1.24 mg/dL 0.54(L) 0.69 0.70  Sodium 135 - 145 mmol/L 134(L) 130(L) 131(L)  Potassium 3.5 - 5.1 mmol/L 3.3(L) 3.3(L) 3.5  Chloride 98 - 111 mmol/L 98 93(L) 93(L)  CO2 22 - 32 mmol/L 29 27 32  Calcium 8.9 - 10.3 mg/dL 8.0(L) 8.1(L) 7.8(L)    ASSESSMENT  Patient appears short of breath. He is accompanied by his sister to visit today. He brought medications with him to visit. He reports excess fluid in abdomen and has been taking furosemide  40 mg TID instead of BID as prescribed. Endorses GI upset with potassium. Unsure why he was prescribed magnesium. He endorses cramping. He continues to drink and smoke. Denies illicit substances.  He is out of metoprolol succinate. Reports that he had been seeing a nurse practitioner but he has been unable to reach her.  PLAN  1). CHF -Fluid management with furosemide 40 mg BID (patient has been taking TID) -Nutritional repletion with potassium 20 mEq daily + magnesium 400 mg daily (has not been taking either) -Counseled patient  on importance of taking nutritional supplements to prevent deficiencies  -Discussed with provider- plan to start patient on spironolactone 25 mg daily and to take furosemide 40 mg BID. BMP today  2). Cirrhosis -Secondary to hepatitis C + alcohol use -Continues to drink 6-12 beers/day -He does not follow with gastroenterology  3). Epilepsy -Phenobarbital 100 mg qHS  4). COPD -Advair 250-50 mcg one puff BID + albuterol inhaler PRN -Counseled patient on swish + spit after using Advair -He continues to smoke 5 cigarettes/day  5). Anxiety -Alprazolam 1 mg BID PRN  6). Drug-drug interactions -Concern for respiratory depression with alcohol use + barbiturate + benzodiazepine -Counseled patient on avoiding alcohol while taking these other medications  Time spent: 25 minutes  [place] Resident  02/19/2020 1:59 PM    Current Outpatient Medications:  .  albuterol (PROVENTIL HFA;VENTOLIN HFA) 108 (90 Base) MCG/ACT inhaler, Inhale 2 puffs into the lungs every 6 (six) hours as needed for wheezing or shortness of breath., Disp: , Rfl:  .  Fluticasone-Salmeterol (ADVAIR) 250-50 MCG/DOSE AEPB, Inhale 1 puff into the lungs 2 (two) times daily., Disp: , Rfl:  .  folic acid (FOLVITE) A999333 MCG tablet, Take 400 mcg by mouth daily., Disp: , Rfl:  .  Magnesium Oxide 400 MG CAPS, Take 1 capsule (400 mg total) by mouth daily at 2 PM., Disp: 30 capsule, Rfl: 0 .  oxyCODONE (OXY IR/ROXICODONE) 5 MG immediate release tablet, Take 1 tablet (5 mg total) by mouth every 4 (four) hours as needed for moderate pain or severe pain., Disp: 30 tablet, Rfl: 0 .  PHENObarbital (LUMINAL) 100 MG tablet, Take 100 mg by mouth at bedtime. , Disp: , Rfl:  .  potassium chloride 20 MEQ TBCR, Take 20 mEq by mouth daily for 5 days., Disp: 5 tablet, Rfl: 0 .  SPIRIVA HANDIHALER 18 MCG inhalation capsule, Place 18 mcg into inhaler and inhale daily., Disp: , Rfl:  .  therapeutic multivitamin-minerals (THERAGRAN-M)  tablet, Take 1 tablet by mouth daily., Disp: , Rfl:  .  thiamine 100 MG tablet, Take 100 mg by mouth daily., Disp: , Rfl:    COUNSELING POINTS/CLINICAL PEARLS  Metoprolol Succinate (Goal: 200 mg once daily) Warn patient to avoid activities requiring mental alertness or coordination until drug effects are realized, as drug may cause dizziness. Tell patient planning major surgery with anesthesia to alert physician that drug is being used, as drug impairs ability of heart to respond to reflex adrenergic stimuli. Drug may cause diarrhea, fatigue, headache, or depression. Advise diabetic patient to carefully monitor blood glucose as drug may mask symptoms of hypoglycemia. Patient should take extended-release tablet with or immediately following meals. Counsel patient against sudden discontinuation of drug, as this may precipitate hypertension, angina, or myocardial infarction. In the event of a missed dose, counsel patient to skip the missed dose and maintain a regular dosing schedule. Furosemide  Drug causes sun-sensitivity. Advise patient to use sunscreen and avoid tanning  beds. Patient should avoid activities requiring coordination until drug effects are realized, as drug may cause dizziness, vertigo, or blurred vision. This drug may cause hyperglycemia, hyperuricemia, constipation, diarrhea, loss of appetite, nausea, vomiting, purpuric disorder, cramps, spasticity, asthenia, headache, paresthesia, or scaling eczema. Instruct patient to report unusual bleeding/bruising or signs/symptoms of hypotension, infection, pancreatitis, or ototoxicity (tinnitus, hearing impairment). Advise patient to report signs/symptoms of a severe skin reactions (flu-like symptoms, spreading red rash, or skin/mucous membrane blistering) or erythema multiforme. Instruct patient to eat high-potassium foods during drug therapy, as directed by healthcare professional.  Patient should not drink alcohol while taking this  drug. Spironolactone  Warn patient to report dehydration, hypotension, or symptoms of worsening renal function.  Counsel male patient to report gynecomastia.  Side effects may include diarrhea, nausea, vomiting, abdominal cramping, fever, leg cramps, lethargy, mental confusion, decreased libido, irregular menses, and rash. Suspension: Tell patient to take drug consistently with respect to food, either before or after a meal.  Advise patient to avoid potassium supplements and foods containing high levels of potassium, including salt substitutes.  DRUGS TO AVOID IN HEART FAILURE  Drug or Class Mechanism  Analgesics . NSAIDs . COX-2 inhibitors . Glucocorticoids  Sodium and water retention, increased systemic vascular resistance, decreased response to diuretics   Diabetes Medications . Metformin . Thiazolidinediones o Rosiglitazone (Avandia) o Pioglitazone (Actos) . DPP4 Inhibitors o Saxagliptin (Onglyza) o Sitagliptin (Januvia)   Lactic acidosis Possible calcium channel blockade   Unknown  Antiarrhythmics . Class I  o Flecainide o Disopyramide . Class III o Sotalol . Other o Dronedarone  Negative inotrope, proarrhythmic   Proarrhythmic, beta blockade  Negative inotrope  Antihypertensives . Alpha Blockers o Doxazosin . Calcium Channel Blockers o Diltiazem o Verapamil o Nifedipine . Central Alpha Adrenergics o Moxonidine . Peripheral Vasodilators o Minoxidil  Increases renin and aldosterone  Negative inotrope    Possible sympathetic withdrawal  Unknown  Anti-infective . Itraconazole . Amphotericin B  Negative inotrope Unknown  Hematologic . Anagrelide . Cilostazol   Possible inhibition of PD IV Inhibition of PD III causing arrhythmias  Neurologic/Psychiatric . Stimulants . Anti-Seizure  Drugs o Carbamazepine o Pregabalin . Antidepressants o Tricyclics o Citalopram . Parkinsons o Bromocriptine o Pergolide o Pramipexole . Antipsychotics o Clozapine . Antimigraine o Ergotamine o Methysergide . Appetite suppressants . Bipolar o Lithium  Peripheral alpha and beta agonist activity  Negative inotrope and chronotrope Calcium channel blockade  Negative inotrope, proarrhythmic Dose-dependent QT prolongation  Excessive serotonin activity/valvular damage Excessive serotonin activity/valvular damage Unknown  IgE mediated hypersensitivy, calcium channel blockade  Excessive serotonin activity/valvular damage Excessive serotonin activity/valvular damage Valvular damage  Direct myofibrillar degeneration, adrenergic stimulation  Antimalarials . Chloroquine . Hydroxychloroquine Intracellular inhibition of lysosomal enzymes  Urologic Agents . Alpha Blockers o Doxazosin o Prazosin o Tamsulosin o Terazosin  Increased renin and aldosterone  Adapted from Page RL, et al. "Drugs That May Cause or Exacerbate Heart Failure: A Scientific Statement from the [place]." Circulation 2016; O8193432. DOI: 10.1161/CIR.0000000000000426   MEDICATION ADHERENCES TIPS AND STRATEGIES 1. Taking medication as prescribed improves patient outcomes in heart failure (reduces hospitalizations, improves symptoms, increases survival) 2. Side effects of medications can be managed by decreasing doses, switching agents, stopping drugs, or adding additional therapy. Please let someone in the [facility] know if you have having bothersome side effects so we can modify your regimen. Do not alter your medication regimen without talking to [place].  3. Medication reminders can help patients remember to  take drugs on time. If you are missing or forgetting doses you can try linking behaviors, using pill boxes, or an electronic reminder like an alarm on your phone or an app. Some  people can also get automated phone calls as medication reminders.

## 2020-02-19 NOTE — Progress Notes (Signed)
Patient ID: Glen Colon, male    DOB: 1950/11/01, 69 y.o.   MRN: [id number]  HPI  Glen Colon is a 69 y/o male with a history of cirrhosis, vitamin D deficiency, COPD, anemia, hepatitis C, seizures, anxiety, current tobacco/ ETOH use and chronic heart failure.   Echo report from 12/16/2018 reviewed and showed an EF of 60-65% along with moderately elevated PA pressure and mild LAE.   Admitted 11/07/19 due to left distal radius fracture. Had hyponatremia due to cirrhosis. Discharged after 2 days with follow-up with orthopaedics.   He presents today for a follow-up visit with a chief complaint of minimal shortness of breath upon moderate exertion. He describes this as chronic in nature having been present for several years. He has associated fatigue, cough, chest pain, pedal edema, abdominal distention, light-headedness, easy bruising and difficulty sleeping along with this. He denies any palpitations or weight gain.   Says that he's been taking 3 furosemide daily because he gets so much swelling in his abdomen and legs and takes his potassium inconsistently. Says that he did take 1 potassium tablet today.  Past Medical History:  Diagnosis Date  . Alcohol dependence ([place]) 10/13/2017  . Anemia   . Angina at rest [facility])    "take an aspirin & it goes right away" (10/26/2017)  . Anxiety   . Arthritis    "joints hurt; especially on cold days" (10/26/2017)  . CHF (congestive heart failure) ([place])   . Chronic bronchitis ([place])   . Cirrhosis ([place]) 10/13/2017  . COPD (chronic obstructive pulmonary disease) ([place])   . Epilepsy ([place]) 2018   "no sz if I take RX q night at bedtime" (10/26/2017)  . Hepatitis C, chronic ([place])    "tried tx in 2018; the RX gave me seizures; couldn't walk for 3 days; never finished the the tx" (10/26/2017)  . History of hiatal hernia   . Hypogonadism in male 10/18/2017  . Marijuana abuse 10/13/2017  . Pneumonia 1995  . Seizure ([place]) 2018   "related to hepatitis RX" (10/26/2017)  .  Tobacco abuse   . Vitamin D deficiency 10/18/2017   Past Surgical History:  Procedure Laterality Date  . ESOPHAGOGASTRODUODENOSCOPY    . EXTERNAL FIXATION LEG Right 10/13/2017   Procedure: EXTERNAL FIXATION LEG;  Surgeon: Altamese [place], MD;  Location: [facility];  Service: Orthopedics;  Laterality: Right;  . FRACTURE SURGERY    . I & D EXTREMITY Right 10/13/2017   Procedure: REPEAT IRRIGATION AND DEBRIDEMENT EXTREMITY WITH ADJUSTMENT OF EXTERNAL FIXITOR;  Surgeon: Altamese [place], MD;  Location: [facility];  Service: Orthopedics;  Laterality: Right;  . INTRAMEDULLARY (IM) NAIL FIBULA Right 10/13/2017   Procedure: INTRAMEDULLARY (IM) NAIL FIBULA;  Surgeon: Altamese Weyerhaeuser, MD;  Location: [facility];  Service: Orthopedics;  Laterality: Right;  . LIVER CYST REMOVAL    . [place], "walking pna"  . OPEN REDUCTION INTERNAL FIXATION (ORIF) DISTAL RADIAL FRACTURE Left 11/22/2019   Procedure: OPEN REDUCTION INTERNAL FIXATION (ORIF) DISTAL RADIAL FRACTURE;  Surgeon: Hessie Knows, MD;  Location: [facility];  Service: Orthopedics;  Laterality: Left;  . ORIF ANKLE FRACTURE Right 10/11/2017   Procedure: I & D right ankle with application of external fixator and wound vac;  Surgeon: Hessie Knows, MD;  Location: [facility];  Service: Orthopedics;  Laterality: Right;   History reviewed. No pertinent family history. Social History   Tobacco Use  . Smoking status: Current Some Day Smoker    Packs/day: 0.25  Years: 46.00    Pack years: 11.50    Last attempt to quit: 10/11/2017    Years since quitting: 2.3  . Smokeless tobacco: Former Systems developer    Types: Chew  Substance Use Topics  . Alcohol use: Not Currently    Alcohol/week: 6.0 standard drinks    Types: 6 Cans of beer per week    Comment: 10/26/2017 "12 cans of beer a day  til 10/11/2017; nothing since 10/11/2017)   Allergies  Allergen Reactions  . Codeine Nausea And Vomiting and Other (See Comments)    CHILLS & SWEATS "DEATHLY ILL" per Pt  description   Prior to Admission medications   Medication Sig Start Date End Date Taking? Authorizing Provider  albuterol (PROVENTIL HFA;VENTOLIN HFA) 108 (90 Base) MCG/ACT inhaler Inhale 2 puffs into the lungs every 6 (six) hours as needed for wheezing or shortness of breath.   Yes [provider]  ALPRAZolam Duanne Moron) 1 MG tablet Take 1 mg by mouth 2 (two) times daily as needed. 11/09/19  Yes [provider]  Fluticasone-Salmeterol (ADVAIR) 250-50 MCG/DOSE AEPB Inhale 1 puff into the lungs 2 (two) times daily.   Yes [provider]  folic acid (FOLVITE) A999333 MCG tablet Take 400 mcg by mouth daily.   Yes [provider]  furosemide (LASIX) 40 MG tablet Take 40 mg by mouth 2 (two) times daily. 01/03/20  Yes [provider]  Magnesium Oxide 400 MG CAPS Take 1 capsule (400 mg total) by mouth daily at 2 PM. 11/10/19  Yes Wieting, Richard, MD  PHENObarbital (LUMINAL) 100 MG tablet Take 100 mg by mouth at bedtime.    Yes [provider]  SPIRIVA HANDIHALER 18 MCG inhalation capsule Place 18 mcg into inhaler and inhale daily. 09/25/19  Yes [provider]  therapeutic multivitamin-minerals (THERAGRAN-M) tablet Take 1 tablet by mouth daily.   Yes [provider]  thiamine 100 MG tablet Take 100 mg by mouth daily.   Yes [provider]  metoprolol succinate (TOPROL-XL) 25 MG 24 hr tablet Take 12.5 mg by mouth daily. 01/03/20   [provider]  oxyCODONE (OXY IR/ROXICODONE) 5 MG immediate release tablet Take 1 tablet (5 mg total) by mouth every 4 (four) hours as needed for moderate pain or severe pain. Patient not taking: Reported on 02/19/2020 11/22/19   Hessie Knows, MD  potassium chloride 20 MEQ TBCR Take 20 mEq by mouth daily for 5 days. Patient not taking: Reported on 02/19/2020 11/09/19 11/22/19  Loletha Grayer, MD     Review of Systems  Constitutional: Positive for fatigue. Negative for appetite change.  HENT:  Negative for congestion, postnasal drip and sore throat.   Eyes: Negative.   Respiratory: Positive for cough (productive) and shortness of breath.   Cardiovascular: Positive for chest pain and leg swelling. Negative for palpitations.  Gastrointestinal: Positive for abdominal distention. Negative for abdominal pain.  Endocrine: Negative.   Genitourinary: Negative.   Musculoskeletal: Negative for back pain and myalgias.  Allergic/Immunologic: Negative.   Neurological: Positive for light-headedness. Negative for dizziness.  Hematological: Negative for adenopathy. Bruises/bleeds easily.  Psychiatric/Behavioral: Positive for sleep disturbance.   Vitals:   02/19/20 1351  BP: 93/72  Pulse: (!) 53  Resp: 20  SpO2: 91%  Weight: 204 lb (92.5 kg)  Height: 5\' 10"  (1.778 m)   Wt Readings from Last 3 Encounters:  02/19/20 204 lb (92.5 kg)  11/09/19 211 lb 8 oz (95.9 kg)  12/18/18 217 lb 1.6 oz (98.5 kg)  Lab Results  Component Value Date   CREATININE 0.54 (L) 11/09/2019   CREATININE 0.69 11/08/2019   CREATININE 0.70 11/08/2019    Physical Exam Vitals and nursing note reviewed.  Constitutional:      Appearance: He is well-developed.  HENT:     Head: Normocephalic and atraumatic.  Eyes:     Extraocular Movements: Extraocular movements intact.     Pupils: Pupils are equal, round, and reactive to light.  Neck:     Vascular: No JVD.  Cardiovascular:     Rate and Rhythm: Regular rhythm. Bradycardia present.  Pulmonary:     Effort: Pulmonary effort is normal. No respiratory distress.     Breath sounds: No wheezing or rales.  Abdominal:     General: There is distension.     Palpations: Abdomen is soft.  Musculoskeletal:     Cervical back: Normal range of motion.     Right lower leg: No tenderness. Edema (1+ pitting) present.     Left lower leg: No tenderness. Edema (1+ pitting) present.  Skin:    General: Skin is warm and dry.  Neurological:     General: No focal deficit  present.     Mental Status: He is alert and oriented to person, place, and time.  Psychiatric:        Mood and Affect: Mood normal.        Behavior: Behavior normal.    Assessment & Plan:  1. Chronic heart failure with preserved ejection fraction- - NYHA class II - euvolemic  - weighing daily; reminded to call for an overnight weight gain of >2 pounds or a weekly weight gain of >5 pounds.  - not adding salt and has been trying to follow a low sodium diet.  - decrease furosemide back to 40mg  BID - add spironolactone 25mg  daily - check BMP/ Magnesium level today - BMP 11/09/19 reviewed and showed sodium 134, potassium 3.3, creatinine 0.54 and GFR >60 - has received both COVID vaccines - PharmD reconciled medications with the patient and his sister - he will think about paramedicine referral and his sister will let me know  2: Lymphedema- - stage 2 - says that his pedal edema is better in the mornings once he's had his legs elevated in bed but they don't go completely down - has been wearing compression socks daily which has helped with the edems - encouraged him to elevate his legs when sitting for long periods of time  3: Cirrhosis- - reports drinking 6-12 twelve ounce cans of beer daily - says that he tried hepatitis C treatment but he couldn't tolerate it so then stopped - will make GI referral as he hasn't been seen by anyone since 2015  4: COPD- - wearing oxygen at 4L at bedtime but says that it has never been officially ordered - he's using a cleaned up concentrator that belonged to someone else - smoking 5-6 cigarettes daily but removes himself from the oxygen - will make pulmonology referral; was told his PCP needs to make the referral and patient/ sister say they can't get ahold of his PCP due to PCP's illness; NT also tried calling and had to leave a message and she will follow up with PCP's office tomorrow   Medication bottles were reviewed.  Return in 2 weeks or  sooner for any questions/problems before then.

## 2020-02-19 NOTE — Telephone Encounter (Signed)
After receiving call from the lab regarding critical potassium level of 2.4, called patient's sister, [name], to inform her of the results.   Advised that with his potassium level being low, he needed to take 5 of his current 54meq tablets spread out over today and tomorrow. He will be starting spironolactone 25mg  daily beginning tomorrow as well along with decreasing his furosemide back to 40mg  BID which is how it was originally ordered.   Sodium low at 128 but, again, he's been taking 120mg  furosemide daily instead of 80mg . Renal function looks good. Also offered to have patient go to the ED for potassium infusion.   [name] called me back and said he will take the extra potassium and will make sure he takes the daily magnesium (new RX sent in). Would like to recheck labs in the next couple of days but patient's sister, [name], is getting ready to go out of town and patient currently doesn't drive. She will check with patient's son and see if he can bring him to the lab in 2 days to have it rechecked. Otherwise, will need to schedule [facility] transportation to pick him up.

## 2020-02-19 NOTE — Patient Instructions (Signed)
Continue weighing daily and call for an overnight weight gain of > 2 pounds or a weekly weight gain of >5 pounds.   Begin taking spironolactone as 1 tablet daily.

## 2020-02-20 ENCOUNTER — Telehealth: Payer: Self-pay | Admitting: Family

## 2020-02-20 DIAGNOSIS — I5032 Chronic diastolic (congestive) heart failure: Secondary | ICD-10-CM

## 2020-02-20 DIAGNOSIS — E876 Hypokalemia: Secondary | ICD-10-CM

## 2020-02-20 NOTE — Telephone Encounter (Signed)
Patient's sister says that patient's son will bring him to the lab on Friday, May 28 to have his BMP rechecked. Orders are placed

## 2020-02-22 ENCOUNTER — Encounter
Admission: RE | Admit: 2020-02-22 | Discharge: 2020-02-22 | Disposition: A | Discharge status: Home / Self Care | Payer: Medicare Other | Source: Ambulatory Visit | Attending: Family | Admitting: Family

## 2020-02-22 ENCOUNTER — Telehealth: Payer: Self-pay | Admitting: Family

## 2020-02-22 ENCOUNTER — Other Ambulatory Visit: Payer: Self-pay

## 2020-02-22 DIAGNOSIS — E876 Hypokalemia: Secondary | ICD-10-CM

## 2020-02-22 LAB — BASIC METABOLIC PANEL
Anion gap: 11 (ref 5–15)
BUN: 10 mg/dL (ref 8–23)
CO2: 31 mmol/L (ref 22–32)
Calcium: 8.7 mg/dL — ABNORMAL LOW (ref 8.9–10.3)
Chloride: 89 mmol/L — ABNORMAL LOW (ref 98–111)
Creatinine, Ser: 0.69 mg/dL (ref 0.61–1.24)
GFR calc Af Amer: >60 mL/min (ref >60)
GFR calc non Af Amer: >60 mL/min (ref >60)
Glucose, Bld: 99 mg/dL (ref 70–99)
Potassium: 2.7 mmol/L — CL (ref 3.5–5.1)
Sodium: 131 mmol/L — ABNORMAL LOW (ref 135–145)

## 2020-02-22 LAB — MAGNESIUM: Magnesium: 1.8 mg/dL (ref 1.7–2.4)

## 2020-02-22 NOTE — Telephone Encounter (Signed)
Called patient's sister, [name], regarding lab results that were obtained earlier today. Renal function looks good and potassium is improving although is still critically low at 2.7. (was 2.4) Earlier this week, he took additional 5 tablets of potassium and [name] was instructed to have him take another 4 tablets of potassium in addition to his normal dose (83meq daily) and spironolactone (25mg  daily).   She will bring him in for a recheck of labs in 4 days.

## 2020-02-26 ENCOUNTER — Encounter
Admission: RE | Admit: 2020-02-26 | Discharge: 2020-02-26 | Disposition: A | Discharge status: Home / Self Care | Payer: Medicare Other | Source: Ambulatory Visit | Attending: Family | Admitting: Family

## 2020-02-26 ENCOUNTER — Other Ambulatory Visit: Payer: Self-pay

## 2020-02-26 DIAGNOSIS — E876 Hypokalemia: Secondary | ICD-10-CM | POA: Insufficient documentation

## 2020-02-26 LAB — BASIC METABOLIC PANEL
Anion gap: 9 (ref 5–15)
BUN: 9 mg/dL (ref 8–23)
CO2: 31 mmol/L (ref 22–32)
Calcium: 8.3 mg/dL — ABNORMAL LOW (ref 8.9–10.3)
Chloride: 95 mmol/L — ABNORMAL LOW (ref 98–111)
Creatinine, Ser: 0.63 mg/dL (ref 0.61–1.24)
GFR calc Af Amer: >60 mL/min (ref >60)
GFR calc non Af Amer: >60 mL/min (ref >60)
Glucose, Bld: 77 mg/dL (ref 70–99)
Potassium: 3.6 mmol/L (ref 3.5–5.1)
Sodium: 135 mmol/L (ref 135–145)

## 2020-03-04 ENCOUNTER — Other Ambulatory Visit: Payer: Self-pay

## 2020-03-04 ENCOUNTER — Ambulatory Visit: Payer: Medicare Other | Attending: Family | Admitting: Family

## 2020-03-04 ENCOUNTER — Encounter: Payer: Self-pay | Admitting: Family

## 2020-03-04 ENCOUNTER — Telehealth: Payer: Self-pay | Admitting: Family

## 2020-03-04 VITALS — BP 109/68 | HR 67 | Resp 18 | Ht 69.0 in | Wt 206.0 lb

## 2020-03-04 DIAGNOSIS — Z79899 Other long term (current) drug therapy: Secondary | ICD-10-CM | POA: Insufficient documentation

## 2020-03-04 DIAGNOSIS — Z8781 Personal history of (healed) traumatic fracture: Secondary | ICD-10-CM | POA: Diagnosis not present

## 2020-03-04 DIAGNOSIS — G40909 Epilepsy, unspecified, not intractable, without status epilepticus: Secondary | ICD-10-CM | POA: Diagnosis not present

## 2020-03-04 DIAGNOSIS — J449 Chronic obstructive pulmonary disease, unspecified: Secondary | ICD-10-CM | POA: Diagnosis not present

## 2020-03-04 DIAGNOSIS — B182 Chronic viral hepatitis C: Secondary | ICD-10-CM | POA: Diagnosis not present

## 2020-03-04 DIAGNOSIS — F419 Anxiety disorder, unspecified: Secondary | ICD-10-CM | POA: Insufficient documentation

## 2020-03-04 DIAGNOSIS — Z9981 Dependence on supplemental oxygen: Secondary | ICD-10-CM | POA: Insufficient documentation

## 2020-03-04 DIAGNOSIS — I5032 Chronic diastolic (congestive) heart failure: Secondary | ICD-10-CM | POA: Diagnosis present

## 2020-03-04 DIAGNOSIS — I890 Lymphedema, not elsewhere classified: Secondary | ICD-10-CM | POA: Diagnosis not present

## 2020-03-04 DIAGNOSIS — Z7951 Long term (current) use of inhaled steroids: Secondary | ICD-10-CM | POA: Diagnosis not present

## 2020-03-04 DIAGNOSIS — K7030 Alcoholic cirrhosis of liver without ascites: Secondary | ICD-10-CM

## 2020-03-04 DIAGNOSIS — F17210 Nicotine dependence, cigarettes, uncomplicated: Secondary | ICD-10-CM | POA: Diagnosis not present

## 2020-03-04 DIAGNOSIS — M1990 Unspecified osteoarthritis, unspecified site: Secondary | ICD-10-CM | POA: Diagnosis not present

## 2020-03-04 DIAGNOSIS — K7460 Unspecified cirrhosis of liver: Secondary | ICD-10-CM | POA: Diagnosis not present

## 2020-03-04 LAB — BASIC METABOLIC PANEL
Anion gap: 9 (ref 5–15)
BUN: 6 mg/dL — ABNORMAL LOW (ref 8–23)
CO2: 29 mmol/L (ref 22–32)
Calcium: 8.4 mg/dL — ABNORMAL LOW (ref 8.9–10.3)
Chloride: 94 mmol/L — ABNORMAL LOW (ref 98–111)
Creatinine, Ser: 0.67 mg/dL (ref 0.61–1.24)
GFR calc Af Amer: >60 mL/min (ref >60)
GFR calc non Af Amer: >60 mL/min (ref >60)
Glucose, Bld: 105 mg/dL — ABNORMAL HIGH (ref 70–99)
Potassium: 3.3 mmol/L — ABNORMAL LOW (ref 3.5–5.1)
Sodium: 132 mmol/L — ABNORMAL LOW (ref 135–145)

## 2020-03-04 LAB — MAGNESIUM: Magnesium: 1.5 mg/dL — ABNORMAL LOW (ref 1.7–2.4)

## 2020-03-04 MED ORDER — SPIRONOLACTONE 25 MG PO TABS
50.0000 mg | ORAL_TABLET | Freq: Every day | ORAL | 5 refills | Status: AC
Start: 2020-03-04 — End: 2020-06-02

## 2020-03-04 NOTE — Progress Notes (Signed)
Patient ID: Glen Colon, male    DOB: [birth year], 69 y.o.   MRN: [id number]  HPI  Glen Colon is a 69 y/o male with a history of cirrhosis, vitamin D deficiency, COPD, anemia, hepatitis C, seizures, anxiety, current tobacco/ ETOH use and chronic heart failure.   Echo report from 12/16/2018 reviewed and showed an EF of 60-65% along with moderately elevated PA pressure and mild LAE.   Admitted 11/07/19 due to left distal radius fracture. Had hyponatremia due to cirrhosis. Discharged after 2 days with follow-up with orthopaedics.   He presents today for a follow-up visit with a chief complaint of minimal fatigue upon moderate exertion. He describes this as chronic in nature having been present for several years. He has associated cough, pedal edema (improving), light-headedness and easy bruising along with this. He denies any difficulty sleeping, abdominal distention, palpitations, chest pain, shortness of breath or weight gain. Says that his weight at home runs between 200-204.   Medications were adjust last visit. Potassium was critically low (2.4) so extra supplements were given with gradual improvement of his potassium (3.6)  Past Medical History:  Diagnosis Date  . Alcohol dependence ([place]) 10/13/2017  . Anemia   . Angina at rest [facility])    "take an aspirin & it goes right away" (10/26/2017)  . Anxiety   . Arthritis    "joints hurt; especially on cold days" (10/26/2017)  . CHF (congestive heart failure) ([place])   . Chronic bronchitis ([place])   . Cirrhosis ([place]) 10/13/2017  . COPD (chronic obstructive pulmonary disease) ([place])   . Epilepsy ([place]) 2018   "no sz if I take RX q night at bedtime" (10/26/2017)  . Hepatitis C, chronic ([place])    "tried tx in 2018; the RX gave me seizures; couldn't walk for 3 days; never finished the the tx" (10/26/2017)  . History of hiatal hernia   . Hypogonadism in male 10/18/2017  . Marijuana abuse 10/13/2017  . Pneumonia 1995  . Seizure ([place]) 2018   "related to  hepatitis RX" (10/26/2017)  . Tobacco abuse   . Vitamin D deficiency 10/18/2017   Past Surgical History:  Procedure Laterality Date  . ESOPHAGOGASTRODUODENOSCOPY    . EXTERNAL FIXATION LEG Right 10/13/2017   Procedure: EXTERNAL FIXATION LEG;  Surgeon: Altamese [place], MD;  Location: [place];  Service: Orthopedics;  Laterality: Right;  . FRACTURE SURGERY    . I & D EXTREMITY Right 10/13/2017   Procedure: REPEAT IRRIGATION AND DEBRIDEMENT EXTREMITY WITH ADJUSTMENT OF EXTERNAL FIXITOR;  Surgeon: Altamese Bottineau, MD;  Location: [place];  Service: Orthopedics;  Laterality: Right;  . INTRAMEDULLARY (IM) NAIL FIBULA Right 10/13/2017   Procedure: INTRAMEDULLARY (IM) NAIL FIBULA;  Surgeon: Altamese [place], MD;  Location: [place];  Service: Orthopedics;  Laterality: Right;  . LIVER CYST REMOVAL    . [place], "walking pna"  . OPEN REDUCTION INTERNAL FIXATION (ORIF) DISTAL RADIAL FRACTURE Left 11/22/2019   Procedure: OPEN REDUCTION INTERNAL FIXATION (ORIF) DISTAL RADIAL FRACTURE;  Surgeon: Hessie Knows, MD;  Location: [facility];  Service: Orthopedics;  Laterality: Left;  . ORIF ANKLE FRACTURE Right 10/11/2017   Procedure: I & D right ankle with application of external fixator and wound vac;  Surgeon: Hessie Knows, MD;  Location: [facility];  Service: Orthopedics;  Laterality: Right;   No family history on file. Social History   Tobacco Use  . Smoking status: Current Every Day Smoker    Packs/day: 0.25  Years: 46.00    Pack years: 11.50  . Smokeless tobacco: Former Systems developer    Types: Chew  Substance Use Topics  . Alcohol use: Yes    Alcohol/week: 6.0 - 12.0 standard drinks    Types: 6 - 12 Cans of beer per week   Allergies  Allergen Reactions  . Codeine Nausea And Vomiting and Other (See Comments)    CHILLS & SWEATS "DEATHLY ILL" per Pt description   Prior to Admission medications   Medication Sig Start Date End Date Taking? Authorizing Provider  albuterol (PROVENTIL  HFA;VENTOLIN HFA) 108 (90 Base) MCG/ACT inhaler Inhale 2 puffs into the lungs every 6 (six) hours as needed for wheezing or shortness of breath.   Yes [provider]  ALPRAZolam Duanne Moron) 1 MG tablet Take 1 mg by mouth 2 (two) times daily as needed. 11/09/19  Yes [provider]  Fluticasone-Salmeterol (ADVAIR) 250-50 MCG/DOSE AEPB Inhale 1 puff into the lungs 2 (two) times daily.   Yes [provider]  folic acid (FOLVITE) 324 MCG tablet Take 400 mcg by mouth daily.   Yes [provider]  furosemide (LASIX) 40 MG tablet Take 1 tablet (40 mg total) by mouth 2 (two) times daily. 02/19/20  Yes Darylene Price A, FNP  Magnesium Oxide 400 MG CAPS Take 1 capsule (400 mg total) by mouth daily. 02/19/20  Yes Darylene Price A, FNP  metoprolol succinate (TOPROL-XL) 25 MG 24 hr tablet Take 0.5 tablets (12.5 mg total) by mouth daily. 02/19/20  Yes Darylene Price A, FNP  oxyCODONE (OXY IR/ROXICODONE) 5 MG immediate release tablet Take 1 tablet (5 mg total) by mouth every 4 (four) hours as needed for moderate pain or severe pain. 11/22/19  Yes Hessie Knows, MD  PHENObarbital (LUMINAL) 100 MG tablet Take 100 mg by mouth at bedtime.    Yes [provider]  potassium chloride SA (KLOR-CON) 20 MEQ tablet Take 20 mEq by mouth 2 (two) times daily.   Yes [provider]  SPIRIVA HANDIHALER 18 MCG inhalation capsule Place 18 mcg into inhaler and inhale daily. 09/25/19  Yes [provider]  spironolactone (ALDACTONE) 25 MG tablet Take 1 tablet (25 mg total) by mouth daily. 02/19/20 05/19/20 Yes Alisa Graff, FNP  therapeutic multivitamin-minerals [facility]) tablet Take 1 tablet by mouth daily.   Yes [provider]  thiamine 100 MG tablet Take 100 mg by mouth daily.   Yes [provider]     Review of Systems  Constitutional: Positive for fatigue. Negative for appetite change.  HENT: Negative for congestion, postnasal drip and sore throat.    Eyes: Negative.   Respiratory: Positive for cough (productive). Negative for shortness of breath.   Cardiovascular: Positive for leg swelling. Negative for chest pain and palpitations.  Gastrointestinal: Negative for abdominal distention and abdominal pain.  Endocrine: Negative.   Genitourinary: Negative.   Musculoskeletal: Negative for back pain and myalgias.  Allergic/Immunologic: Negative.   Neurological: Positive for light-headedness. Negative for dizziness.  Hematological: Negative for adenopathy. Bruises/bleeds easily.  Psychiatric/Behavioral: Negative for sleep disturbance (sleeping on 1 pillow).   Vitals:   03/04/20 1321  BP: 109/68  Pulse: 67  Resp: 18  SpO2: 92%  Weight: 206 lb (93.4 kg)  Height: 5\' 9"  (1.753 m)   Wt Readings from Last 3 Encounters:  03/04/20 206 lb (93.4 kg)  02/19/20 204 lb (92.5 kg)  11/09/19 211 lb 8 oz (95.9 kg)   Lab Results  Component Value Date   CREATININE 0.63  02/26/2020   CREATININE 0.69 02/22/2020   CREATININE 0.81 02/19/2020    Physical Exam Vitals and nursing note reviewed.  Constitutional:      Appearance: He is well-developed.  HENT:     Head: Normocephalic and atraumatic.  Eyes:     Extraocular Movements: Extraocular movements intact.     Pupils: Pupils are equal, round, and reactive to light.  Neck:     Vascular: No JVD.  Cardiovascular:     Rate and Rhythm: Normal rate and regular rhythm.  Pulmonary:     Effort: Pulmonary effort is normal. No respiratory distress.     Breath sounds: No wheezing or rales.  Abdominal:     General: There is no distension.     Palpations: Abdomen is soft.  Musculoskeletal:        General: No tenderness.     Cervical back: Normal range of motion.     Right lower leg: No tenderness. Edema (trace pitting) present.     Left lower leg: No tenderness. Edema (1+ pitting) present.  Skin:    General: Skin is warm and dry.  Neurological:     General: No focal deficit present.     Mental  Status: He is alert and oriented to person, place, and time.  Psychiatric:        Mood and Affect: Mood normal.        Behavior: Behavior normal.    Assessment & Plan:  1. Chronic heart failure with preserved ejection fraction- - NYHA class II - euvolemic  - weighing daily & says his weight ranges from 200-204 pounds; reminded to call for an overnight weight gain of >2 pounds or a weekly weight gain of >5 pounds.  - weight up 2 pounds from last visit here 2 weeks ago - not adding salt and has been trying to follow a low sodium diet.  - furosemide decreased back to 40mg  BID at his last visit - spironolactone 25mg  daily added at last visit & he's taking potassium 40meq BID - check BMP/ Magnesium level today - BMP 02/26/20 reviewed and showed sodium 135, potassium 3.6, creatinine 0.63 and GFR >60 - has received both COVID vaccines - he will think about paramedicine referral and his sister will let me know  2: Lymphedema- - stage 2 - says that his pedal edema is better in the mornings once he's had his legs elevated but they don't ever go completely down - has been wearing compression socks daily which has helped with the edema; encouraged him to continue wearing them - encouraged him to elevate his legs when sitting for long periods of time  3: Cirrhosis- - reports drinking 6-12 twelve ounce cans of beer daily - says that he tried hepatitis C treatment but he couldn't tolerate it so then stopped - has GI appointment scheduled July 2021  4: COPD- - wearing oxygen at 4L at bedtime but says that it has never been officially ordered - he's using a cleaned up concentrator that belonged to someone else - smoking 5-6 cigarettes daily but removes himself from the oxygen - sister says that they are trying to get approval for a pulmonologist; she is also going to see about getting a new PCP as they can't ever get a hold of his current one   Patient did not bring his medications nor a list.  Each medication was verbally reviewed with the patient and he was encouraged to bring the bottles to every visit to confirm accuracy of  list.  Return in 2 months or sooner for any questions/problems before then.

## 2020-03-04 NOTE — Patient Instructions (Signed)
Continue weighing daily and call for an overnight weight gain of > 2 pounds or a weekly weight gain of >5 pounds. 

## 2020-03-04 NOTE — Telephone Encounter (Signed)
Spoke with patient's sister, [name], regarding lab results obtained today. Potassium level is 3.3 with magnesium 1.5. Renal function looks good. Currently he's taking furosemide 40mg  BID, potassium 49meq BID, magnesium 400mg  daily and spironolactone 25mg  daily.   Daneil Dan to have him start taking spironolactone 50mg  daily and to take 1 additional potassium tablet today. Can recheck this at his next visit.

## 2020-04-10 ENCOUNTER — Other Ambulatory Visit: Payer: Self-pay | Admitting: Gastroenterology

## 2020-04-10 DIAGNOSIS — K7030 Alcoholic cirrhosis of liver without ascites: Secondary | ICD-10-CM

## 2020-04-14 ENCOUNTER — Other Ambulatory Visit: Payer: Self-pay | Admitting: Gastroenterology

## 2020-04-14 DIAGNOSIS — B182 Chronic viral hepatitis C: Secondary | ICD-10-CM

## 2020-04-14 DIAGNOSIS — R772 Abnormality of alphafetoprotein: Secondary | ICD-10-CM

## 2020-04-14 DIAGNOSIS — K7030 Alcoholic cirrhosis of liver without ascites: Secondary | ICD-10-CM

## 2020-04-16 ENCOUNTER — Telehealth: Payer: Self-pay | Admitting: *Deleted

## 2020-04-16 NOTE — Telephone Encounter (Signed)
Received referral for low dose lung cancer screening CT scan. Message left at phone number listed in EMR for patient to call me back to facilitate scheduling scan.  

## 2020-04-21 ENCOUNTER — Ambulatory Visit: Payer: Medicare Other

## 2020-04-21 IMAGING — CT CT HEAD W/O CM
3 series · 15 of 47 positions shown, 18 images · non-contrast
Comparison: None.

CLINICAL DATA: Recent fall with left arm pain and headaches,
initial encounter

EXAM:
CT HEAD WITHOUT CONTRAST
TECHNIQUE: Contiguous axial images were obtained from the base of the skull
through the vertex without intravenous contrast.

[Series 3: head wo · axial · 0.45mm/px · z∈[+183,+318]mm · 9 of 33 slices shown, 12 images]
[im 3/33  brain]
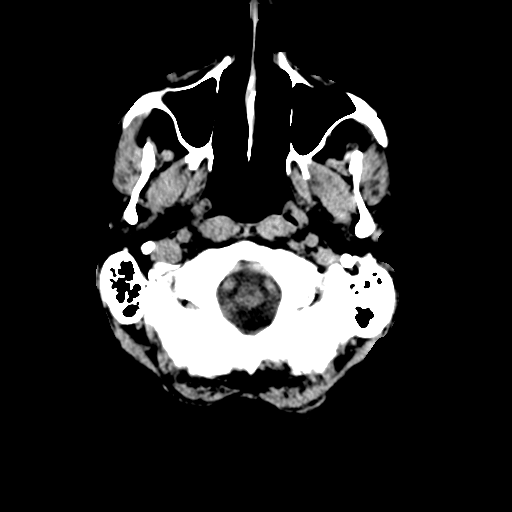
[im 3/33  bone]
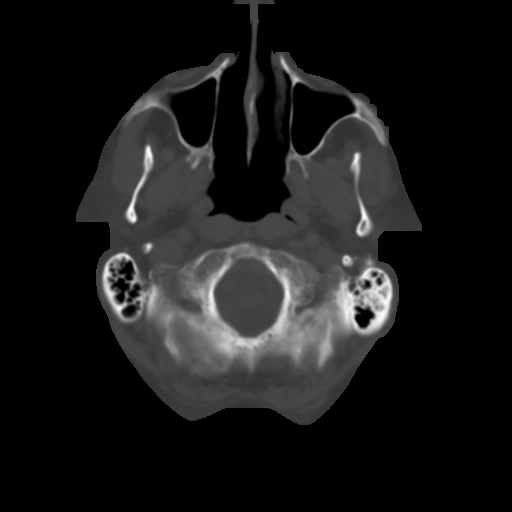
[im 6/33  brain]
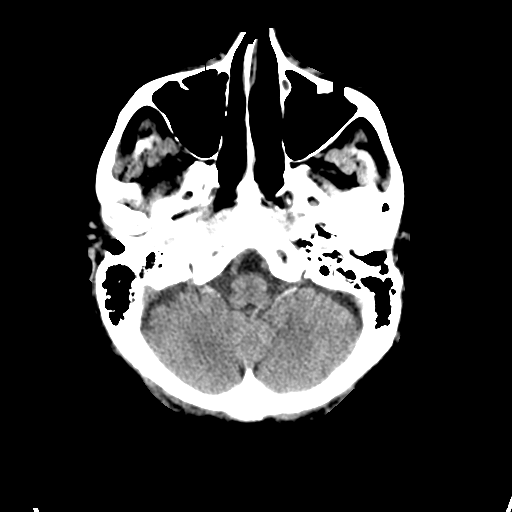
[im 9/33  brain]
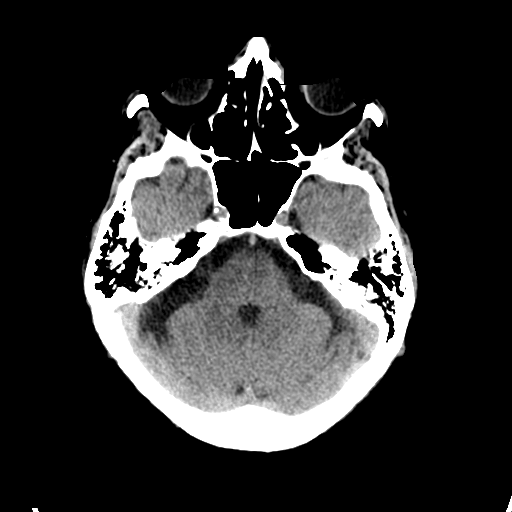
[im 13/33  brain]
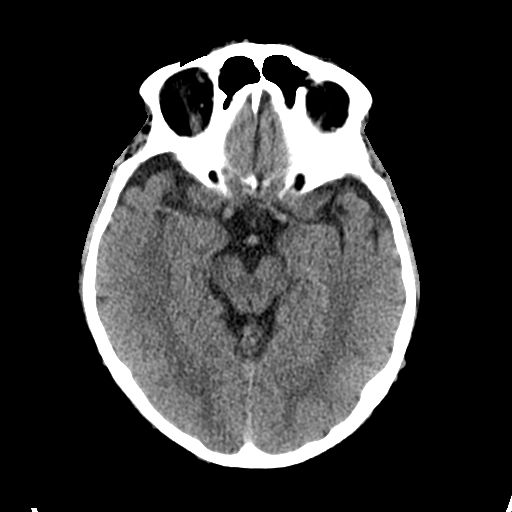
[im 17/33  brain]
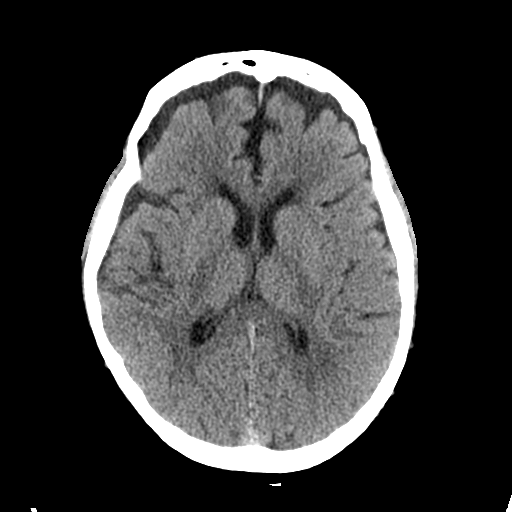
[im 17/33  bone]
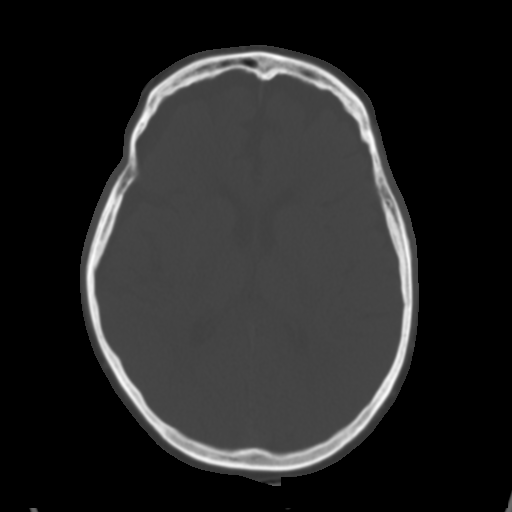
[im 20/33  brain]
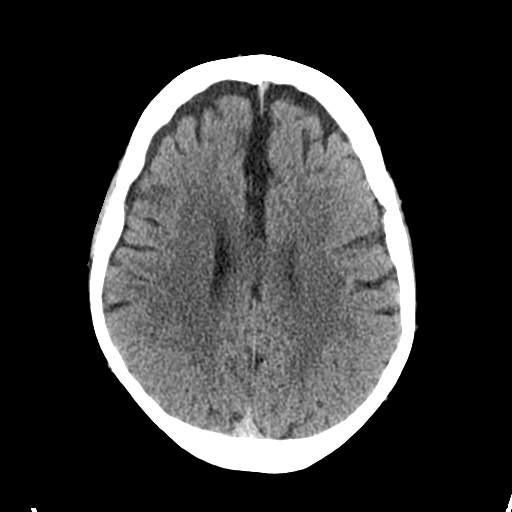
[im 24/33  brain]
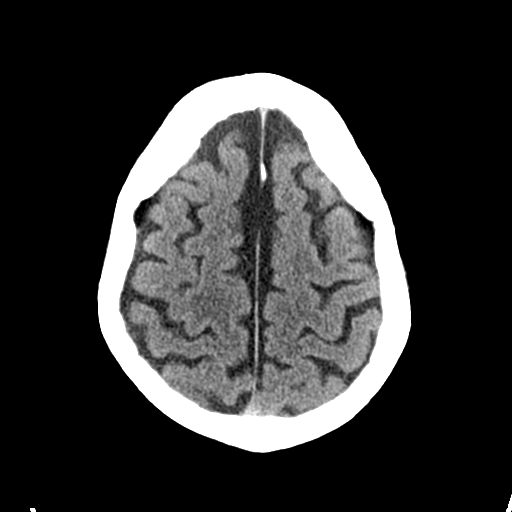
[im 27/33  brain]
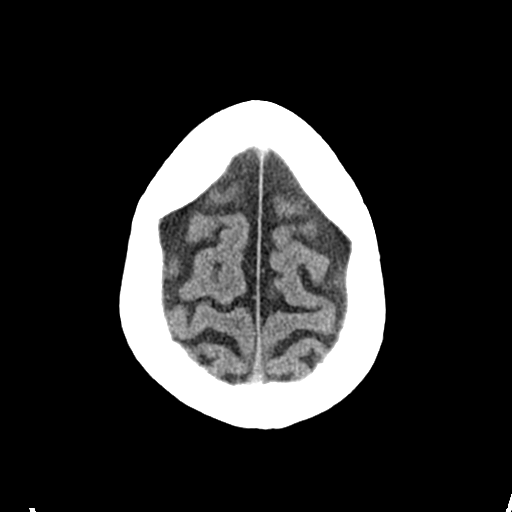
[im 30/33  brain]
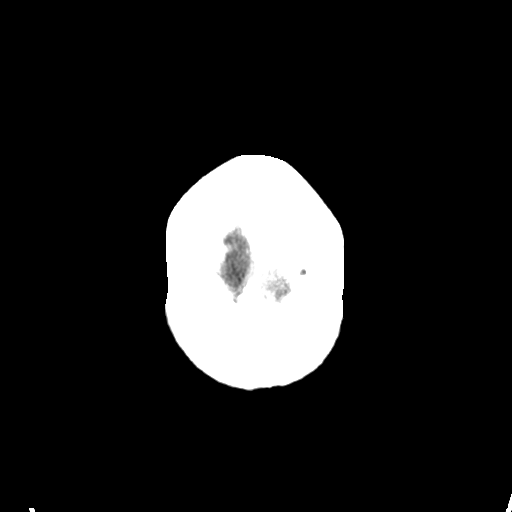
[im 30/33  bone]
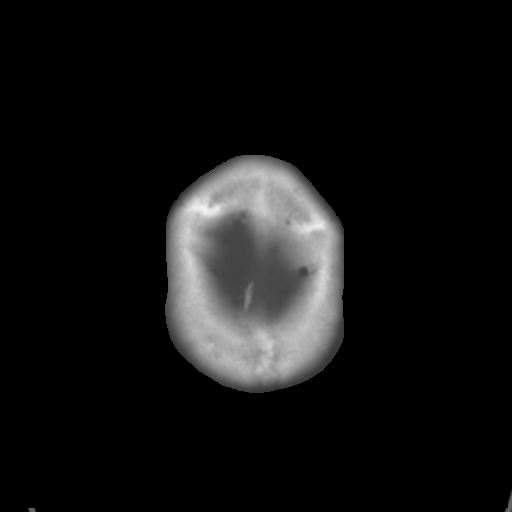

[Series 4: coronal soft tissue · coronal · 0.34mm/px · 3 of 75 slices shown]
[im 25/75  brain]
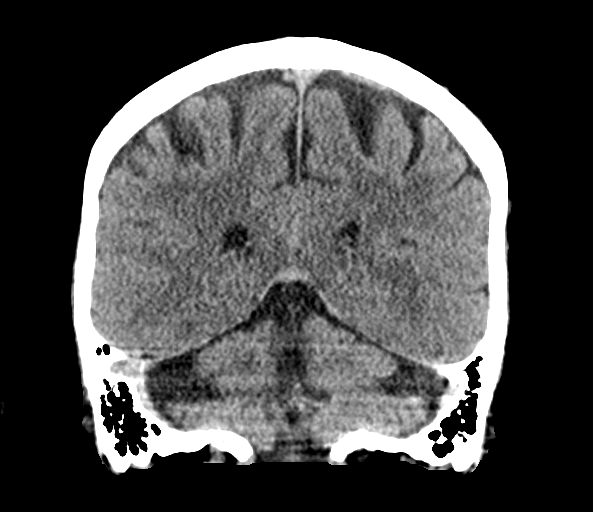
[im 33/75  brain]
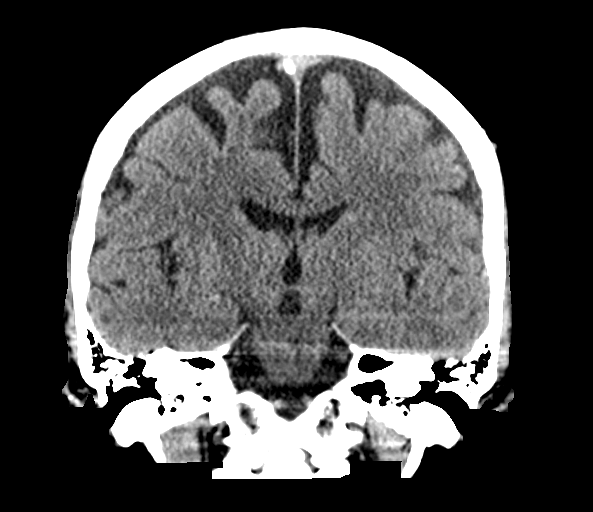
[im 42/75  brain]
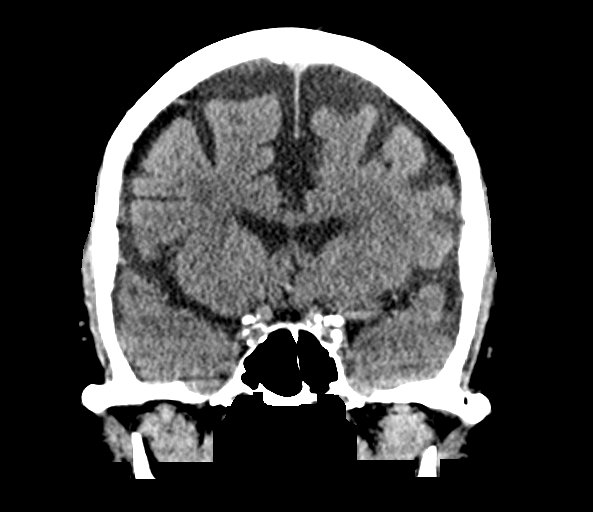

[Series 5: sagittal soft tissue · sagittal · 0.37mm/px · 3 of 56 slices shown]
[im 19/56  brain]
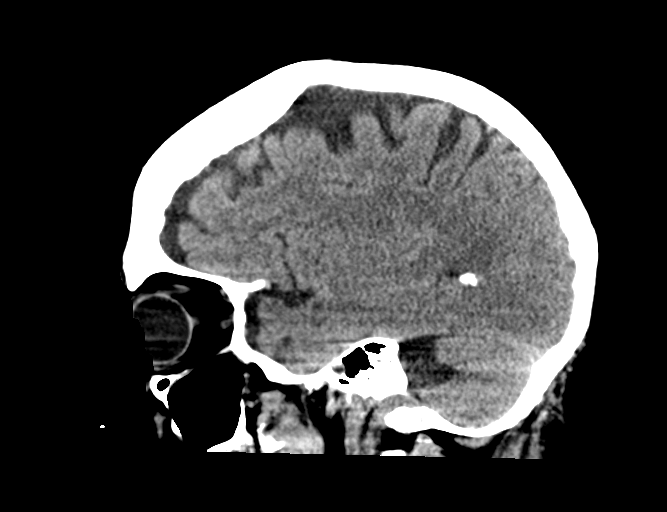
[im 28/56  brain]
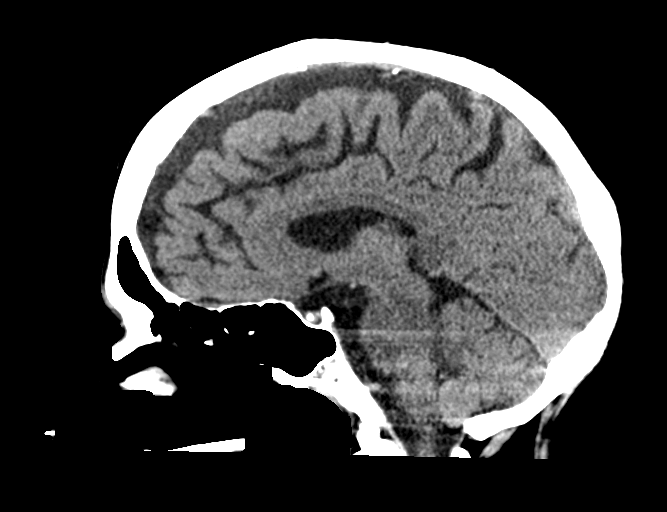
[im 37/56  brain]
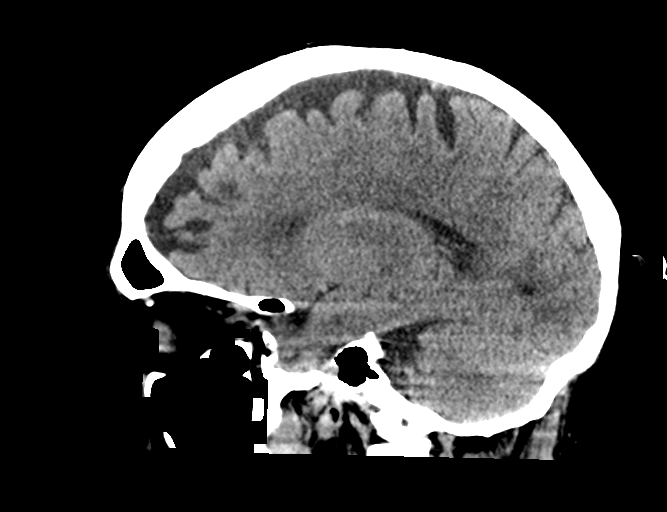

[15 of 47 positions shown; findings below may reference images not displayed]

FINDINGS: Brain: Mild atrophic changes are noted. No findings to suggest acute
hemorrhage, acute infarction or space-occupying mass lesion are
noted.

Vascular: No hyperdense vessel or unexpected calcification.

Skull: Normal. Negative for fracture or focal lesion.

Sinuses/Orbits: No acute finding.

Other: None.
IMPRESSION: Atrophic changes without acute abnormality.

## 2020-04-28 ENCOUNTER — Ambulatory Visit
Admission: RE | Admit: 2020-04-28 | Discharge: 2020-04-28 | Disposition: A | Discharge status: Home / Self Care | Payer: Medicare Other | Source: Ambulatory Visit | Attending: Gastroenterology | Admitting: Gastroenterology

## 2020-04-28 ENCOUNTER — Other Ambulatory Visit: Payer: Self-pay

## 2020-04-28 DIAGNOSIS — K7030 Alcoholic cirrhosis of liver without ascites: Secondary | ICD-10-CM | POA: Insufficient documentation

## 2020-04-28 DIAGNOSIS — R772 Abnormality of alphafetoprotein: Secondary | ICD-10-CM | POA: Insufficient documentation

## 2020-04-28 DIAGNOSIS — B182 Chronic viral hepatitis C: Secondary | ICD-10-CM | POA: Insufficient documentation

## 2020-04-28 LAB — POCT I-STAT CREATININE: Creatinine, Ser: 0.8 mg/dL (ref 0.61–1.24)

## 2020-04-28 MED ORDER — IOHEXOL 300 MG/ML  SOLN
100.0000 mL | Freq: Once | INTRAMUSCULAR | Status: AC | PRN
  Administered 2020-04-28: 100 mL via INTRAVENOUS

## 2020-05-05 ENCOUNTER — Inpatient Hospital Stay: Payer: Medicare Other | Attending: Oncology | Admitting: Oncology

## 2020-05-05 ENCOUNTER — Inpatient Hospital Stay: Payer: Medicare Other

## 2020-05-05 ENCOUNTER — Encounter: Payer: Self-pay | Admitting: Oncology

## 2020-05-05 ENCOUNTER — Other Ambulatory Visit: Payer: Self-pay

## 2020-05-05 ENCOUNTER — Telehealth: Payer: Self-pay

## 2020-05-05 VITALS — BP 99/66 | HR 99 | Temp 97.6°F | Resp 18 | Ht 69.0 in | Wt 207.8 lb

## 2020-05-05 DIAGNOSIS — Z79899 Other long term (current) drug therapy: Secondary | ICD-10-CM | POA: Diagnosis not present

## 2020-05-05 DIAGNOSIS — Z7189 Other specified counseling: Secondary | ICD-10-CM | POA: Diagnosis not present

## 2020-05-05 DIAGNOSIS — C220 Liver cell carcinoma: Secondary | ICD-10-CM | POA: Insufficient documentation

## 2020-05-05 DIAGNOSIS — B182 Chronic viral hepatitis C: Secondary | ICD-10-CM

## 2020-05-05 DIAGNOSIS — E871 Hypo-osmolality and hyponatremia: Secondary | ICD-10-CM | POA: Insufficient documentation

## 2020-05-05 DIAGNOSIS — E876 Hypokalemia: Secondary | ICD-10-CM | POA: Diagnosis not present

## 2020-05-05 DIAGNOSIS — F1010 Alcohol abuse, uncomplicated: Secondary | ICD-10-CM | POA: Insufficient documentation

## 2020-05-05 DIAGNOSIS — K7460 Unspecified cirrhosis of liver: Secondary | ICD-10-CM | POA: Diagnosis not present

## 2020-05-05 LAB — CBC WITH DIFFERENTIAL/PLATELET
Abs Immature Granulocytes: 0.04 10*3/uL (ref 0.00–0.07)
Basophils Absolute: 0.1 10*3/uL (ref 0.0–0.1)
Basophils Relative: 1 %
Eosinophils Absolute: 0.1 10*3/uL (ref 0.0–0.5)
Eosinophils Relative: 1 %
HCT: 39.8 % (ref 39.0–52.0)
Hemoglobin: 14.6 g/dL (ref 13.0–17.0)
Immature Granulocytes: 1 %
Lymphocytes Relative: 16 %
Lymphs Abs: 1 10*3/uL (ref 0.7–4.0)
MCH: 37.5 pg — ABNORMAL HIGH (ref 26.0–34.0)
MCHC: 36.7 g/dL — ABNORMAL HIGH (ref 30.0–36.0)
MCV: 102.3 fL — ABNORMAL HIGH (ref 80.0–100.0)
Monocytes Absolute: 0.8 10*3/uL (ref 0.1–1.0)
Monocytes Relative: 13 %
Neutro Abs: 4.2 10*3/uL (ref 1.7–7.7)
Neutrophils Relative %: 68 %
Platelets: 63 10*3/uL — ABNORMAL LOW (ref 150–400)
RBC: 3.89 MIL/uL — ABNORMAL LOW (ref 4.22–5.81)
RDW: 14 % (ref 11.5–15.5)
WBC: 6.2 10*3/uL (ref 4.0–10.5)
nRBC: 0 % (ref 0.0–0.2)

## 2020-05-05 LAB — APTT: aPTT: 41 seconds — ABNORMAL HIGH (ref 24–36)

## 2020-05-05 LAB — COMPREHENSIVE METABOLIC PANEL
ALT: 54 U/L — ABNORMAL HIGH (ref 0–44)
AST: 132 U/L — ABNORMAL HIGH (ref 15–41)
Albumin: 2.6 g/dL — ABNORMAL LOW (ref 3.5–5.0)
Alkaline Phosphatase: 214 U/L — ABNORMAL HIGH (ref 38–126)
Anion gap: 14 (ref 5–15)
BUN: 8 mg/dL (ref 8–23)
CO2: 29 mmol/L (ref 22–32)
Calcium: 8.2 mg/dL — ABNORMAL LOW (ref 8.9–10.3)
Chloride: 83 mmol/L — ABNORMAL LOW (ref 98–111)
Creatinine, Ser: 0.7 mg/dL (ref 0.61–1.24)
GFR calc Af Amer: >60 mL/min (ref >60)
GFR calc non Af Amer: >60 mL/min (ref >60)
Glucose, Bld: 98 mg/dL (ref 70–99)
Potassium: 2.9 mmol/L — ABNORMAL LOW (ref 3.5–5.1)
Sodium: 126 mmol/L — ABNORMAL LOW (ref 135–145)
Total Bilirubin: 7.7 mg/dL — ABNORMAL HIGH (ref 0.3–1.2)
Total Protein: 7 g/dL (ref 6.5–8.1)

## 2020-05-05 LAB — SAMPLE TO BLOOD BANK

## 2020-05-05 LAB — IRON AND TIBC
Iron: 178 ug/dL (ref 45–182)
Saturation Ratios: 94 % — ABNORMAL HIGH (ref 17.9–39.5)
TIBC: 190 ug/dL — ABNORMAL LOW (ref 250–450)
UIBC: 12 ug/dL

## 2020-05-05 LAB — PROTIME-INR
INR: 1.8 — ABNORMAL HIGH (ref 0.8–1.2)
Prothrombin Time: 20.3 seconds — ABNORMAL HIGH (ref 11.4–15.2)

## 2020-05-05 LAB — FERRITIN: Ferritin: 434 ng/mL — ABNORMAL HIGH (ref 24–336)

## 2020-05-05 NOTE — Progress Notes (Signed)
Pt here to establish care. Reports having black stools for the past 2 days. He took pepto bismol and he believes it makes his stool black.

## 2020-05-05 NOTE — Progress Notes (Signed)
Hematology/Oncology Consult note [facility] Telephone:([phone] [phone]   Patient Care Team: Glen Ramsay, MD as PCP - General (Infectious Diseases) Glen Brookneal, MD as Consulting Physician (Orthopedic Surgery)  REFERRING PROVIDER: Allean Colon*  CHIEF COMPLAINTS/REASON FOR VISIT:  Evaluation of [place]  HISTORY OF PRESENTING ILLNESS:   Glen Colon is a  69 y.o.  male with PMH listed below was seen in consultation at the request of  Glen Colon*  for evaluation of Glen Colon 11/07/2019 CT abdomen pelvis with contrast showed no evidence of acute traumatic injury in abdomen or pelvis. Hepatic cirrhosis with small volume abdominal/pelvis ascites.  2 ill-defined enhancing foci of the liver, 1 in the left lobe and 1 in the central liver.  Patient was recommended to have hepatic protocol MRI for characterization was on outpatient basis.  Portal hypertension with perigastric varices, left upper quadrant varices and splenorenal shunting.  Colonic diverticulosis  04/28/2020 CT abdomen pelvis with and without contrast showed multifocal hepatocellular carcinoma with largest lesion in the central liver.  All areas of the liver are involved.  No definitive extension of the tumor into larger portal branches but extension towards the left portal vein in the facial for falciform ligament.  Signs of portal hypertension with very large gastric varices greater than 3 cm. Diminutive portal vein which remains patent but narrowed at the level of the splenic portal confluence tracking into the liver. Hepatic arterial supply derive from the celiac axis and classic fashion.  Patient has a history of hepatitis with cirrhosis as well as chronic alcohol use.  Patient was previously seen at [facility] ID clinic for hepatitis C treatment but had a seizure medication change which precipitated seizure at the time and ultimately was not treated for HCV.  Patient also has a  history of chronic alcohol use, still drinking about 2 of the 6 packs or more beers daily.  Patient was accompanied by his sister.  He lives at home by himself.  Patient reports feeling very tired and also has had dark stools Review of Systems  Constitutional: Positive for fatigue. Negative for appetite change, chills, fever and unexpected weight change.  HENT:   Negative for hearing loss and voice change.   Eyes: Negative for eye problems and icterus.  Respiratory: Negative for chest tightness, cough and shortness of breath.   Cardiovascular: Negative for chest pain and leg swelling.  Gastrointestinal: Negative for abdominal distention and abdominal pain.       Dark stool  Endocrine: Negative for hot flashes.  Genitourinary: Negative for difficulty urinating, dysuria and frequency.   Musculoskeletal: Negative for arthralgias.  Skin: Negative for itching and rash.  Neurological: Negative for light-headedness and numbness.  Hematological: Negative for adenopathy. Does not bruise/bleed easily.  Psychiatric/Behavioral: Negative for confusion.    MEDICAL HISTORY:  Past Medical History:  Diagnosis Date  . Alcohol dependence ([place]) 10/13/2017  . Anemia   . Angina at rest [facility])    "take an aspirin & it goes right away" (10/26/2017)  . Anxiety   . Arthritis    "joints hurt; especially on cold days" (10/26/2017)  . CHF (congestive heart failure) ([place])   . Chronic bronchitis ([place])   . Cirrhosis ([place]) 10/13/2017  . COPD (chronic obstructive pulmonary disease) ([place])   . Epilepsy ([place]) 2018   "no sz if I take RX q night at bedtime" (10/26/2017)  . Hepatitis C, chronic ([place])    "tried tx in 2018; the RX gave me seizures;  couldn't walk for 3 days; never finished the the tx" (10/26/2017)  . History of hiatal hernia   . Hypogonadism in male 10/18/2017  . Marijuana abuse 10/13/2017  . Pneumonia 1995  . Seizure ([place]) 2018   "related to hepatitis RX" (10/26/2017)  . Tobacco abuse   . Vitamin D  deficiency 10/18/2017    SURGICAL HISTORY: Past Surgical History:  Procedure Laterality Date  . ESOPHAGOGASTRODUODENOSCOPY    . EXTERNAL FIXATION LEG Right 10/13/2017   Procedure: EXTERNAL FIXATION LEG;  Surgeon: Glen [place], MD;  Location: [place];  Service: Orthopedics;  Laterality: Right;  . FRACTURE SURGERY    . I & D EXTREMITY Right 10/13/2017   Procedure: REPEAT IRRIGATION AND DEBRIDEMENT EXTREMITY WITH ADJUSTMENT OF EXTERNAL FIXITOR;  Surgeon: Glen Lemay, MD;  Location: [place];  Service: Orthopedics;  Laterality: Right;  . INTRAMEDULLARY (IM) NAIL FIBULA Right 10/13/2017   Procedure: INTRAMEDULLARY (IM) NAIL FIBULA;  Surgeon: [place], MD;  Location: [place];  Service: Orthopedics;  Laterality: Right;  . LIVER CYST REMOVAL    . [place], "walking pna"  . OPEN REDUCTION INTERNAL FIXATION (ORIF) DISTAL RADIAL FRACTURE Left 11/22/2019   Procedure: OPEN REDUCTION INTERNAL FIXATION (ORIF) DISTAL RADIAL FRACTURE;  Surgeon: Hessie Knows, MD;  Location: [facility];  Service: Orthopedics;  Laterality: Left;  . ORIF ANKLE FRACTURE Right 10/11/2017   Procedure: I & D right ankle with application of external fixator and wound vac;  Surgeon: Hessie Knows, MD;  Location: [facility];  Service: Orthopedics;  Laterality: Right;    SOCIAL HISTORY: Social History   Socioeconomic History  . Marital status: Divorced    Spouse name: Not on file  . Number of children: Not on file  . Years of education: Not on file  . Highest education level: Not on file  Occupational History  . Occupation: disability   Tobacco Use  . Smoking status: Current Every Day Smoker    Packs/day: 0.25    Years: 46.00    Pack years: 11.50  . Smokeless tobacco: Former Systems developer    Types: Chew  . Tobacco comment:  2 cigarettes a day   Vaping Use  . Vaping Use: Never used  Substance and Sexual Activity  . Alcohol use: Yes    Alcohol/week: 6.0 - 12.0 standard drinks    Types: 6 - 12 Cans of beer  per week  . Drug use: Not Currently    Types: Marijuana, Cocaine    Comment: 10/26/2017 "nothing since 1995"; occasional [place]   . Sexual activity: Never  Other Topics Concern  . Not on file  Social History Narrative  . Not on file   Social Determinants of Health   Financial Resource Strain:   . Difficulty of Paying Living Expenses:   Food Insecurity:   . Worried About Charity fundraiser in the Last Year:   . Arboriculturist in the Last Year:   Transportation Needs:   . Film/video editor (Medical):   Marland Kitchen Lack of Transportation (Non-Medical):   Physical Activity:   . Days of Exercise per Week:   . Minutes of Exercise per Session:   Stress:   . Feeling of Stress :   Social Connections:   . Frequency of Communication with Friends and Family:   . Frequency of Social Gatherings with Friends and Family:   . Attends Religious Services:   . Active Member of Clubs or Organizations:   . Attends Club  or Organization Meetings:   Marland Kitchen Marital Status:   Intimate Partner Violence:   . Fear of Current or Ex-Partner:   . Emotionally Abused:   Marland Kitchen Physically Abused:   . Sexually Abused:     FAMILY HISTORY: Family History  Problem Relation Age of Onset  . Atrial fibrillation Mother   . Pancreatic cancer Father   . Pulmonary fibrosis Sister   . Prostate cancer Brother   . Colon cancer Brother        colorectal cancer     ALLERGIES:  is allergic to codeine.  MEDICATIONS:  Current Outpatient Medications  Medication Sig Dispense Refill  . albuterol (PROVENTIL HFA;VENTOLIN HFA) 108 (90 Base) MCG/ACT inhaler Inhale 2 puffs into the lungs every 6 (six) hours as needed for wheezing or shortness of breath.    . alprazolam (XANAX) 2 MG tablet Take 2 mg by mouth daily as needed.    . Fluticasone-Salmeterol (ADVAIR) 250-50 MCG/DOSE AEPB Inhale 1 puff into the lungs 2 (two) times daily.    . folic acid (FOLVITE) 347 MCG tablet Take 400 mcg by mouth daily.    . furosemide (LASIX) 40 MG  tablet Take 1 tablet (40 mg total) by mouth 2 (two) times daily. 60 tablet 5  . Ipratropium-Albuterol (COMBIVENT) 20-100 MCG/ACT AERS respimat Inhale into the lungs. Inhale 2 inhalations into the lungs 4 (four) times daily as needed for Wheezing    . Magnesium Oxide 400 MG CAPS Take 1 capsule (400 mg total) by mouth daily. 30 capsule 5  . metoprolol succinate (TOPROL-XL) 25 MG 24 hr tablet Take 0.5 tablets (12.5 mg total) by mouth daily. 15 tablet 5  . nicotine (NICOTROL) 10 MG inhaler Inhale into the lungs. Inhale 2 inhalations into the lungs 6 (six) times daily for 90 days    . omeprazole (PRILOSEC) 20 MG capsule Take 20 mg by mouth daily.    Marland Kitchen PHENObarbital (LUMINAL) 100 MG tablet Take 100 mg by mouth at bedtime.     . potassium chloride SA (KLOR-CON) 20 MEQ tablet Take 20 mEq by mouth 2 (two) times daily.    Marland Kitchen SPIRIVA HANDIHALER 18 MCG inhalation capsule Place 18 mcg into inhaler and inhale daily.    Marland Kitchen spironolactone (ALDACTONE) 25 MG tablet Take 2 tablets (50 mg total) by mouth daily. 30 tablet 5  . therapeutic multivitamin-minerals (THERAGRAN-M) tablet Take 1 tablet by mouth daily.    Marland Kitchen thiamine 100 MG tablet Take 100 mg by mouth daily.    Marland Kitchen ALPRAZolam (XANAX) 1 MG tablet Take 1 mg by mouth 2 (two) times daily as needed. (Patient not taking: Reported on 05/05/2020)    . Budeson-Glycopyrrol-Formoterol (BREZTRI AEROSPHERE) 160-9-4.8 MCG/ACT AERO Inhale into the lungs. (Patient not taking: Reported on 05/05/2020)    . oxyCODONE (OXY IR/ROXICODONE) 5 MG immediate release tablet Take 1 tablet (5 mg total) by mouth every 4 (four) hours as needed for moderate pain or severe pain. (Patient not taking: Reported on 05/05/2020) 30 tablet 0   No current facility-administered medications for this visit.     PHYSICAL EXAMINATION: ECOG PERFORMANCE STATUS: 2 - Symptomatic, <50% confined to bed Vitals:   05/05/20 1124  BP: 99/66  Pulse: 99  Resp: 18  Temp: 97.6 F (36.4 C)  SpO2: 93%   Filed Weights    05/05/20 1124  Weight: 207 lb 12.8 oz (94.3 kg)    Physical Exam Constitutional:      General: He is not in acute distress.    Appearance: He  is ill-appearing.     Comments: Patient states in the wheelchair  HENT:     Head: Normocephalic and atraumatic.  Eyes:     General: No scleral icterus. Cardiovascular:     Rate and Rhythm: Normal rate and regular rhythm.     Heart sounds: Normal heart sounds.  Pulmonary:     Effort: Pulmonary effort is normal. No respiratory distress.     Breath sounds: No wheezing.  Abdominal:     General: Bowel sounds are normal. There is no distension.     Palpations: Abdomen is soft.  Musculoskeletal:        General: No deformity. Normal range of motion.     Cervical back: Normal range of motion and neck supple.  Skin:    General: Skin is warm and dry.     Findings: No erythema or rash.     Comments: Multiple hypopigmented area-from known history of vitiligo  Neurological:     Mental Status: He is alert and oriented to person, place, and time. Mental status is at baseline.     Cranial Nerves: No cranial nerve deficit.     Coordination: Coordination normal.  Psychiatric:        Mood and Affect: Mood normal.     LABORATORY DATA:  I have reviewed the data as listed Lab Results  Component Value Date   WBC 6.2 05/05/2020   HGB 14.6 05/05/2020   HCT 39.8 05/05/2020   MCV 102.3 (H) 05/05/2020   PLT 63 (L) 05/05/2020   Recent Labs    11/07/19 1405 11/07/19 1820 02/26/20 1245 02/26/20 1245 03/04/20 1345 04/28/20 0756 05/05/20 1214  NA  --    < > 135  --  132*  --  126*  K  --    < > 3.6  --  3.3*  --  2.9*  CL  --    < > 95*  --  94*  --  83*  CO2  --    < > 31  --  29  --  29  GLUCOSE  --    < > 77  --  105*  --  98  BUN  --    < > 9  --  6*  --  8  CREATININE  --    < > 0.63   < > 0.67 0.80 0.70  CALCIUM  --    < > 8.3*  --  8.4*  --  8.2*  GFRNONAA  --    < > >60  --  >60  --  >60  GFRAA  --    < > >60  --  >60  --  >60  PROT 6.5   --   --   --   --   --  7.0  ALBUMIN 2.7*  --   --   --   --   --  2.6*  AST 99*  --   --   --   --   --  132*  ALT 39  --   --   --   --   --  54*  ALKPHOS 194*  --   --   --   --   --  214*  BILITOT 4.2*  --   --   --   --   --  7.7*  BILIDIR 1.5*  --   --   --   --   --   --   IBILI 2.7*  --   --   --   --   --   --    < > =  values in this interval not displayed.   Iron/TIBC/Ferritin/ %Sat    Component Value Date/Time   IRON 178 05/05/2020 1214   TIBC 190 (L) 05/05/2020 1214   FERRITIN 434 (H) 05/05/2020 1214   IRONPCTSAT 94 (H) 05/05/2020 1214      RADIOGRAPHIC STUDIES: I have personally reviewed the radiological images as listed and agreed with the findings in the report. CT ABDOMEN PELVIS W WO CONTRAST  Addendum Date: 04/28/2020   ADDENDUM REPORT: 04/28/2020 10:58 ADDENDUM: These results will be called to the ordering clinician or representative by the Radiologist Assistant, and communication documented in the PACS or [employer]. Electronically Signed   By: Zetta Bills M.D.   On: 04/28/2020 10:58   Result Date: 04/28/2020 CLINICAL DATA:  Follow-up liver lesions EXAM: CT ABDOMEN AND PELVIS WITHOUT AND WITH CONTRAST TECHNIQUE: Multidetector CT imaging of the abdomen and pelvis was performed following the standard protocol before and following the bolus administration of intravenous contrast. CONTRAST:  160mL OMNIPAQUE IOHEXOL 300 MG/ML  SOLN COMPARISON:  11/07/2019 FINDINGS: Lower chest: Basilar atelectasis. No dense consolidation. No pleural effusion. No pericardial fluid. Hepatobiliary: Signs of cirrhosis and portal hypertension with a similar appearance in terms of background liver disease. Observation 1: 6 x 5 cm area of arterial phase enhancement with mosaic enhancement and washout with ill-defined margins and numerous satellite lesions in hepatic subsegment IV. Is this becomes better defined on the most delayed portion of the exam and appears infiltrative on the  enhancement pattern is due to the numerous areas of satellite lesions seen in the surrounding liver (image 33, series 15) better defined area measuring 5.3 x 5.3 cm LI-RADS category 5 Observation 2 (is hyperenhancing focus in the posterior RIGHT hepatic lobe measuring 2.6 x 1.7 cm without signs of washout LI-RADS category 4 but more suspicious based on other findings Observation 3: Area in hepatic subsegment III measuring 2.5 x 2.2 cm showing arterial phase enhancement and washout, LI-RADS category 5. Innumerable other areas of hyperenhancement and washout most less than a cm affecting both LEFT and RIGHT hepatic lobe concentrated more in the area of the dominant tumor and in the LEFT hepatic lobe, lateral segment. No gross portal venous extension but extension of less well-defined tumor towards the LEFT portal vein in the inter lobar fissure. Persistent pericholecystic stranding and edema in the setting of portal hypertension. Pancreas: Pancreas is normal without inflammation or ductal dilation. Spleen: Spleen upper limits of normal for size. Robust perisplenic varices and extensive splenorenal shunting with a very large gastric varix arising from short gastric vessels and collaterals measuring 3.6 x 2.9 cm. Adrenals/Urinary Tract: LEFT adrenal is obscured by collateral pathways in the LEFT hemiabdomen but is grossly normal. RIGHT adrenal is normal. Symmetric renal enhancement. No sign of hydronephrosis. Urinary bladder is normal. No suspicious renal lesion. Stomach/Bowel: No acute gastrointestinal process. Large gastric varix as described. Diffuse bowel edema in the setting of portal hypertension. Vascular/Lymphatic: Calcified atheromatous plaque in the abdominal aorta. Diminutive portal vein which remains patent but narrowed at the level of the splenic portal confluence tracking into the liver. Hepatic arterial supply derive from the celiac axis and classic fashion. No adenopathy in the retroperitoneum. No  adenopathy in the pelvis. Reproductive: Prostate heterogeneous, nonspecific finding. Other: No ascites. Musculoskeletal: No acute musculoskeletal process. No destructive bone finding. IMPRESSION: 1. Multifocal hepatocellular carcinoma largest lesion in the central liver as described. All areas of the liver are involved. 2. No definite extension of tumor into larger portal branches but  extension towards the LEFT portal vein in the fissure for false form ligament. Attention on follow-up. 3. Signs of portal hypertension with very large gastric varix greater than 3 cm. 4. Diminutive portal vein which remains patent but narrowed at the level of the splenic portal confluence tracking into the liver. Hepatic arterial supply derive from the celiac axis and classic fashion. 5. Aortic atherosclerosis. Aortic Atherosclerosis (ICD10-I70.0). Electronically Signed: By: Zetta Bills M.D. On: 04/28/2020 10:26      ASSESSMENT & PLAN:  1. Hepatocellular carcinoma ([place])   2. Goals of care, counseling/discussion   3. Hyponatremia   4. Hypokalemia   5. Chronic hepatitis C without hepatic coma ([place])   6. Cirrhosis of liver without ascites, unspecified hepatic cirrhosis type ([place])    #Images were independently reviewed by me and discussed with patient. Multifocal liver lesions in the context of alcoholic/hepatitis C cirrhosis LI-RADS category 5 definitive [place] He has multifocal liver cancer lesions, not a candidate for surgery, still drinking alcohol, most likely not a candidate for transplant.  We will check CBC, CMP, AFP.   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  Patient would like to keep the treatment option open and further discuss with his son.  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  I recommend supportive care alone as majority patients with child  Pugh C disease or not able to tolerate systemic therapy and systemic therapy is unlikely to benefit them because of their short survival. Refer to establish care with palliative care service.  Prognosis is poor  Today's CBC showed normal hemoglobin at 14.6.  Potassium 2.9, sodium 126. Hyponatremia, possible due to underlying malignancy.  Potassium 2.9, recommend patient to continue potassium treatments.  Orders Placed This Encounter  Procedures  . Comprehensive metabolic panel    Standing Status:   Future    Number of Occurrences:   1    Standing Expiration Date:   05/05/2021  . CBC with Differential/Platelet    Standing Status:   Future    Number of Occurrences:   1    Standing Expiration Date:   05/05/2021  . AFP tumor marker    Standing Status:   Future    Number of Occurrences:   1    Standing Expiration Date:   05/05/2021  . Protime-INR    Standing Status:   Future    Number of Occurrences:   1    Standing Expiration Date:   05/05/2021  . APTT    Standing Status:   Future    Number of Occurrences:   1    Standing Expiration Date:   05/05/2021  . Iron and TIBC    Standing Status:   Future    Number of Occurrences:   1    Standing Expiration Date:   05/05/2021  . Ferritin    Standing Status:   Future    Number of Occurrences:   1    Standing Expiration Date:   05/05/2021  . Ambulatory Referral to Palliative Care    Referral Priority:   Routine    Referral Type:   Consultation    Referral Reason:   Advance Care Planning    Referred to Provider:   Borders, Kirt Boys, NP    Number of Visits Requested:   1  .  Sample to Blood Bank    Standing Status:   Future    Number of Occurrences:   1    Standing Expiration Date:   05/05/2021    All questions were answered. The patient knows to call the clinic with any problems questions or concerns.  cc Jacqulyn Liner, Cherlynn Kaiser*   Thank you for this kind referral and the opportunity to participate in the care of this patient. A copy of today's  note is routed to referring provider    Earlie Server, MD, PhD Hematology Oncology [facility] at [facility] Pager- [phone] 05/05/2020

## 2020-05-05 NOTE — Telephone Encounter (Signed)
Ok. Advise him to take potassium 34meq twice daily.

## 2020-05-05 NOTE — Telephone Encounter (Signed)
-----   Message from Earlie Server, MD sent at 05/05/2020  3:58 PM EDT ----- Please let patient/sister know that his hemoglobin is normal, no need for blood transfusion.  Potassium is low, please check if he is take potassium supplementation - this is on his medication list.

## 2020-05-05 NOTE — Telephone Encounter (Signed)
Patient's sister, [name], notified. She states that he is suppose to be taking potassium 20 mEq twice a day, but she thinks he doesn't take it at times. She will let him know to be sure he starts taking it as prescribed.

## 2020-05-06 ENCOUNTER — Encounter: Payer: Self-pay | Admitting: Family

## 2020-05-06 ENCOUNTER — Ambulatory Visit: Payer: Medicare Other | Attending: Family | Admitting: Family

## 2020-05-06 ENCOUNTER — Encounter: Payer: Self-pay | Admitting: Hospice and Palliative Medicine

## 2020-05-06 ENCOUNTER — Inpatient Hospital Stay (HOSPITAL_BASED_OUTPATIENT_CLINIC_OR_DEPARTMENT_OTHER): Payer: Medicare Other | Admitting: Hospice and Palliative Medicine

## 2020-05-06 VITALS — BP 86/60 | HR 123 | Temp 98.2°F | Resp 18

## 2020-05-06 VITALS — BP 109/71 | HR 58 | Resp 18 | Ht 69.0 in | Wt 206.0 lb

## 2020-05-06 DIAGNOSIS — I5032 Chronic diastolic (congestive) heart failure: Secondary | ICD-10-CM | POA: Insufficient documentation

## 2020-05-06 DIAGNOSIS — J449 Chronic obstructive pulmonary disease, unspecified: Secondary | ICD-10-CM | POA: Diagnosis not present

## 2020-05-06 DIAGNOSIS — S52502D Unspecified fracture of the lower end of left radius, subsequent encounter for closed fracture with routine healing: Secondary | ICD-10-CM | POA: Diagnosis not present

## 2020-05-06 DIAGNOSIS — G40909 Epilepsy, unspecified, not intractable, without status epilepticus: Secondary | ICD-10-CM | POA: Diagnosis not present

## 2020-05-06 DIAGNOSIS — K7460 Unspecified cirrhosis of liver: Secondary | ICD-10-CM | POA: Insufficient documentation

## 2020-05-06 DIAGNOSIS — Z885 Allergy status to narcotic agent status: Secondary | ICD-10-CM | POA: Insufficient documentation

## 2020-05-06 DIAGNOSIS — E871 Hypo-osmolality and hyponatremia: Secondary | ICD-10-CM | POA: Insufficient documentation

## 2020-05-06 DIAGNOSIS — F17210 Nicotine dependence, cigarettes, uncomplicated: Secondary | ICD-10-CM | POA: Insufficient documentation

## 2020-05-06 DIAGNOSIS — B182 Chronic viral hepatitis C: Secondary | ICD-10-CM | POA: Insufficient documentation

## 2020-05-06 DIAGNOSIS — Z79899 Other long term (current) drug therapy: Secondary | ICD-10-CM | POA: Insufficient documentation

## 2020-05-06 DIAGNOSIS — Z7951 Long term (current) use of inhaled steroids: Secondary | ICD-10-CM | POA: Insufficient documentation

## 2020-05-06 DIAGNOSIS — M1990 Unspecified osteoarthritis, unspecified site: Secondary | ICD-10-CM | POA: Diagnosis not present

## 2020-05-06 DIAGNOSIS — X58XXXD Exposure to other specified factors, subsequent encounter: Secondary | ICD-10-CM | POA: Diagnosis not present

## 2020-05-06 DIAGNOSIS — Z515 Encounter for palliative care: Secondary | ICD-10-CM

## 2020-05-06 DIAGNOSIS — I890 Lymphedema, not elsewhere classified: Secondary | ICD-10-CM | POA: Diagnosis not present

## 2020-05-06 DIAGNOSIS — E559 Vitamin D deficiency, unspecified: Secondary | ICD-10-CM | POA: Diagnosis not present

## 2020-05-06 DIAGNOSIS — F419 Anxiety disorder, unspecified: Secondary | ICD-10-CM | POA: Insufficient documentation

## 2020-05-06 DIAGNOSIS — D649 Anemia, unspecified: Secondary | ICD-10-CM | POA: Insufficient documentation

## 2020-05-06 DIAGNOSIS — K7030 Alcoholic cirrhosis of liver without ascites: Secondary | ICD-10-CM

## 2020-05-06 DIAGNOSIS — C220 Liver cell carcinoma: Secondary | ICD-10-CM | POA: Diagnosis not present

## 2020-05-06 LAB — AFP TUMOR MARKER: AFP, Serum, Tumor Marker: 2939 ng/mL — ABNORMAL HIGH (ref 0.0–8.3)

## 2020-05-06 NOTE — Patient Instructions (Addendum)
Continue weighing daily and call for an overnight weight gain of > 2 pounds or a weekly weight gain of >5 pounds.   Call us in the future to schedule another appointment

## 2020-05-06 NOTE — Progress Notes (Signed)
Patient ID: Glen Colon, male    DOB: 1951-05-19, 69 y.o.   MRN: [id number]  HPI  Mr Whitby is a 69 y/o male with a history of cirrhosis, vitamin D deficiency, COPD, anemia, hepatitis C, seizures, anxiety, current tobacco/ ETOH use and chronic heart failure.   Echo report from 12/16/2018 reviewed and showed an EF of 60-65% along with moderately elevated PA pressure and mild LAE.   Admitted 11/07/19 due to left distal radius fracture. Had hyponatremia due to cirrhosis. Discharged after 2 days with follow-up with orthopaedics.   He presents today for a follow-up visit with a chief complaint of minimal fatigue upon moderate exertion. He describes this as chronic in nature having been present for several years. He has associated cough (worse in the mornings), pedal edema, weakness and easy bruising along with this. He denies any difficulty sleeping, dizziness, abdominal distention, palpitations, chest pain, shortness of breath or weight gain.   Meeting with palliative care later today to discuss options and plan of care with his liver cirrhosis. Isn't sure he's been taking his potassium supplements consistently and did notice some "shakiness" yesterday, not as bad today.   Past Medical History:  Diagnosis Date  . Alcohol dependence ([place]) 10/13/2017  . Anemia   . Angina at rest [facility])    "take an aspirin & it goes right away" (10/26/2017)  . Anxiety   . Arthritis    "joints hurt; especially on cold days" (10/26/2017)  . CHF (congestive heart failure) ([place])   . Chronic bronchitis ([place])   . Cirrhosis ([place]) 10/13/2017  . COPD (chronic obstructive pulmonary disease) ([place])   . Epilepsy ([place]) 2018   "no sz if I take RX q night at bedtime" (10/26/2017)  . Hepatitis C, chronic ([place])    "tried tx in 2018; the RX gave me seizures; couldn't walk for 3 days; never finished the the tx" (10/26/2017)  . History of hiatal hernia   . Hypogonadism in male 10/18/2017  . Marijuana abuse 10/13/2017  . Pneumonia 1995   . Seizure ([place]) 2018   "related to hepatitis RX" (10/26/2017)  . Tobacco abuse   . Vitamin D deficiency 10/18/2017   Past Surgical History:  Procedure Laterality Date  . ESOPHAGOGASTRODUODENOSCOPY    . EXTERNAL FIXATION LEG Right 10/13/2017   Procedure: EXTERNAL FIXATION LEG;  Surgeon: Altamese Cedar Grove, MD;  Location: [place];  Service: Orthopedics;  Laterality: Right;  . FRACTURE SURGERY    . I & D EXTREMITY Right 10/13/2017   Procedure: REPEAT IRRIGATION AND DEBRIDEMENT EXTREMITY WITH ADJUSTMENT OF EXTERNAL FIXITOR;  Surgeon: Altamese De Borgia, MD;  Location: [place];  Service: Orthopedics;  Laterality: Right;  . INTRAMEDULLARY (IM) NAIL FIBULA Right 10/13/2017   Procedure: INTRAMEDULLARY (IM) NAIL FIBULA;  Surgeon: Altamese Aurora, MD;  Location: [place];  Service: Orthopedics;  Laterality: Right;  . LIVER CYST REMOVAL    . [place], "walking pna"  . OPEN REDUCTION INTERNAL FIXATION (ORIF) DISTAL RADIAL FRACTURE Left 11/22/2019   Procedure: OPEN REDUCTION INTERNAL FIXATION (ORIF) DISTAL RADIAL FRACTURE;  Surgeon: Hessie Knows, MD;  Location: [facility];  Service: Orthopedics;  Laterality: Left;  . ORIF ANKLE FRACTURE Right 10/11/2017   Procedure: I & D right ankle with application of external fixator and wound vac;  Surgeon: Hessie Knows, MD;  Location: [facility];  Service: Orthopedics;  Laterality: Right;   Family History  Problem Relation Age of Onset  . Atrial fibrillation Mother   .  Pancreatic cancer Father   . Pulmonary fibrosis Sister   . Prostate cancer Brother   . Colon cancer Brother        colorectal cancer    Social History   Tobacco Use  . Smoking status: Current Every Day Smoker    Packs/day: 0.25    Years: 46.00    Pack years: 11.50  . Smokeless tobacco: Former Systems developer    Types: Chew  . Tobacco comment:  2 cigarettes a day   Substance Use Topics  . Alcohol use: Yes    Alcohol/week: 6.0 - 12.0 standard drinks    Types: 6 - 12 Cans of beer per week    Allergies  Allergen Reactions  . Codeine Nausea And Vomiting and Other (See Comments)    CHILLS & SWEATS "DEATHLY ILL" per Pt description   Prior to Admission medications   Medication Sig Start Date End Date Taking? Authorizing Provider  albuterol (PROVENTIL HFA;VENTOLIN HFA) 108 (90 Base) MCG/ACT inhaler Inhale 2 puffs into the lungs every 6 (six) hours as needed for wheezing or shortness of breath.   Yes [provider]  alprazolam Duanne Moron) 2 MG tablet Take 1 mg by mouth 2 (two) times daily as needed.  04/09/20  Yes [provider]  Budeson-Glycopyrrol-Formoterol (BREZTRI AEROSPHERE) 160-9-4.8 MCG/ACT AERO Inhale into the lungs.  04/29/20 04/29/21 Yes [provider]  Fluticasone-Salmeterol (ADVAIR) 250-50 MCG/DOSE AEPB Inhale 1 puff into the lungs 2 (two) times daily.   Yes [provider]  folic acid (FOLVITE) 629 MCG tablet Take 400 mcg by mouth daily.   Yes [provider]  furosemide (LASIX) 40 MG tablet Take 1 tablet (40 mg total) by mouth 2 (two) times daily. 02/19/20  Yes Lucyle Alumbaugh, Aura Fey, FNP  Ipratropium-Albuterol (COMBIVENT) 20-100 MCG/ACT AERS respimat Inhale into the lungs. Inhale 2 inhalations into the lungs 4 (four) times daily as needed for Wheezing 04/29/20 04/29/21 Yes [provider]  Magnesium Oxide 400 MG CAPS Take 1 capsule (400 mg total) by mouth daily. 02/19/20  Yes Darylene Price A, FNP  metoprolol succinate (TOPROL-XL) 25 MG 24 hr tablet Take 0.5 tablets (12.5 mg total) by mouth daily. 02/19/20  Yes Mckenna Gamm, Otila Kluver A, FNP  omeprazole (PRILOSEC) 20 MG capsule Take 20 mg by mouth daily. 04/10/20  Yes [provider]  PHENObarbital (LUMINAL) 100 MG tablet Take 100 mg by mouth at bedtime.    Yes [provider]  potassium chloride SA (KLOR-CON) 20 MEQ tablet Take 20 mEq by mouth 2 (two) times daily.   Yes [provider]  spironolactone (ALDACTONE) 25 MG tablet Take 2 tablets (50 mg total) by mouth  daily. Patient taking differently: Take 25 mg by mouth daily.  03/04/20 06/02/20 Yes Sharmon Cheramie, Otila Kluver A, FNP  oxyCODONE (OXY IR/ROXICODONE) 5 MG immediate release tablet Take 1 tablet (5 mg total) by mouth every 4 (four) hours as needed for moderate pain or severe pain. Patient not taking: Reported on 05/05/2020 11/22/19   Hessie Knows, MD  therapeutic multivitamin-minerals [facility]) tablet Take 1 tablet by mouth daily.    [provider]  thiamine 100 MG tablet Take 100 mg by mouth daily.    [provider]     Review of Systems  Constitutional: Positive for fatigue. Negative for appetite change.  HENT: Negative for congestion, postnasal drip and sore throat.   Eyes: Negative.   Respiratory: Positive for cough (productive). Negative for shortness of breath.   Cardiovascular: Positive for leg swelling. Negative for chest  pain and palpitations.  Gastrointestinal: Negative for abdominal distention and abdominal pain.  Endocrine: Negative.   Genitourinary: Negative.   Musculoskeletal: Negative for back pain and myalgias.  Allergic/Immunologic: Negative.   Neurological: Positive for weakness. Negative for dizziness and light-headedness.  Hematological: Negative for adenopathy. Bruises/bleeds easily.  Psychiatric/Behavioral: Negative for sleep disturbance (sleeping on 1 pillow).   Vitals:   05/06/20 1210  BP: 109/71  Pulse: (!) 58  Resp: 18  SpO2: 91%  Weight: 206 lb (93.4 kg)  Height: 5\' 9"  (1.753 m)   Wt Readings from Last 3 Encounters:  05/06/20 206 lb (93.4 kg)  05/05/20 207 lb 12.8 oz (94.3 kg)  03/04/20 206 lb (93.4 kg)   Lab Results  Component Value Date   CREATININE 0.70 05/05/2020   CREATININE 0.80 04/28/2020   CREATININE 0.67 03/04/2020    Physical Exam Vitals and nursing note reviewed.  Constitutional:      Appearance: He is well-developed.  HENT:     Head: Normocephalic and atraumatic.  Eyes:     Extraocular Movements: Extraocular movements  intact.     Pupils: Pupils are equal, round, and reactive to light.  Neck:     Vascular: No JVD.  Cardiovascular:     Rate and Rhythm: Normal rate and regular rhythm.  Pulmonary:     Effort: Pulmonary effort is normal. No respiratory distress.     Breath sounds: No wheezing or rales.  Abdominal:     General: There is no distension.     Palpations: Abdomen is soft.  Musculoskeletal:        General: No tenderness.     Cervical back: Normal range of motion.     Right lower leg: No tenderness. Edema (trace pitting) present.     Left lower leg: No tenderness. Edema (1+ pitting) present.  Skin:    General: Skin is warm and dry.  Neurological:     General: No focal deficit present.     Mental Status: He is alert and oriented to person, place, and time.  Psychiatric:        Mood and Affect: Mood normal.        Behavior: Behavior normal.    Assessment & Plan:  1. Chronic heart failure with preserved ejection fraction- - NYHA class II - euvolemic  - weighing daily; reminded to call for an overnight weight gain of >2 pounds or a weekly weight gain of >5 pounds.  - weight stable from last visit here 2 months ago - not adding salt and has been trying to follow a low sodium diet.  - BMP 05/05/20 reviewed and showed sodium 126, potassium 2.9, creatinine 0.70 and GFR >60 - he's not sure he's been taking his potassium consistently; sister that is present with him is going to try using a pill box; emphasized that he needed to take 1 tablet twice daily every day - has received both COVID vaccines  2: Lymphedema- - stage 2 - says that his pedal edema is better in the mornings once he's had his legs elevated but they don't ever go completely down - has been wearing compression socks daily which has helped with the edema; encouraged him to continue wearing them - encouraged him to elevate his legs when sitting for long periods of time - saw PCP Ola Spurr) 04/14/20  3: Cirrhosis- - has  palliative care consult later today to discuss plan of care and goals of care with patient and his sister - reports drinking 6-12 twelve ounce  cans of beer daily - says that he tried hepatitis C treatment but he couldn't tolerate it so then stopped - saw GI 04/10/20  4: COPD- - smoking 5-6 cigarettes daily  - saw pulmonology Lanney Gins) 04/29/20   Medication bottles reviewed.   Patient would like to call us if needed instead of making another appointment. He feels like he now has a good team assembled and since he's not sure what plan he is going to make in regards to his cirrhosis, he'd rather not have another appointment that he has to go to. Advised him and his sister that they could call back at anytime to schedule another appointment. They were both comfortable with this plan.

## 2020-05-06 NOTE — Progress Notes (Signed)
[place]  [phone] [phone]   [name] Date: 05/06/2020 MRN: [id number]  DOB: 1950-11-30  Patient Care Team: Leonel Ramsay, MD as PCP - General (Infectious Diseases) Altamese [place], MD as Consulting Physician (Orthopedic Surgery)    REASON FOR CONSULTATION: Glen Colon is a 69 y.o. 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  He has been seen by medical oncology with Dr. Tasia Catchings. Patient is not felt to be a candidate for systemic treatment given his advanced cirrhosis.  Prognosis is poor.  He was referred to palliative care to help address goals and manage ongoing symptoms.  SOCIAL HISTORY:     reports that he has been smoking. He has a 11.50 pack-year smoking history. He has quit using smokeless tobacco.  His smokeless tobacco use included chew. He reports current alcohol use of about 6.0 - 12.0 standard drinks of alcohol per week. He reports previous drug use. Drugs: Marijuana and Cocaine.   Patient never married.  He lives at home alone.  He has a son and daughter who live nearby.  Patient sister is very involved in his care.  Patient previously worked in [employer], manufacturing systems.  ADVANCE DIRECTIVES:  Does not have  CODE STATUS:   PAST MEDICAL HISTORY: Past Medical History:  Diagnosis Date   Alcohol dependence ([place]) 10/13/2017   Anemia    Angina at rest [facility])    "take an aspirin & it goes right away" (10/26/2017)   Anxiety    Arthritis    "joints hurt; especially on cold days" (10/26/2017)   CHF (congestive heart failure) (HCC)    Chronic bronchitis ([place])    Cirrhosis ([place]) 10/13/2017   COPD (chronic obstructive pulmonary disease) ([place])    Epilepsy ([place]) 2018   "no sz if I take RX q night at bedtime" (10/26/2017)   Hepatitis C, chronic ([place])      "tried tx in 2018; the RX gave me seizures; couldn't walk for 3 days; never finished the the tx" (10/26/2017)   History of hiatal hernia    Hypogonadism in male 10/18/2017   Marijuana abuse 10/13/2017   Pneumonia 1995   Seizure ([place]) 2018   "related to hepatitis RX" (10/26/2017)   Tobacco abuse    Vitamin D deficiency 10/18/2017    PAST SURGICAL HISTORY:  Past Surgical History:  Procedure Laterality Date   ESOPHAGOGASTRODUODENOSCOPY     EXTERNAL FIXATION LEG Right 10/13/2017   Procedure: EXTERNAL FIXATION LEG;  Surgeon: Altamese [place], MD;  Location: [place];  Service: Orthopedics;  Laterality: Right;   FRACTURE SURGERY     I & D EXTREMITY Right 10/13/2017   Procedure: REPEAT IRRIGATION AND DEBRIDEMENT EXTREMITY WITH ADJUSTMENT OF EXTERNAL FIXITOR;  Surgeon: Altamese [place], MD;  Location: [place];  Service: Orthopedics;  Laterality: Right;   INTRAMEDULLARY (IM) NAIL FIBULA Right 10/13/2017   Procedure: INTRAMEDULLARY (IM) NAIL FIBULA;  Surgeon: Altamese [place], MD;  Location: [place];  Service: Orthopedics;  Laterality: Right;   LIVER CYST REMOVAL     LUNG [place], "walking pna"   OPEN REDUCTION INTERNAL FIXATION (ORIF) DISTAL RADIAL FRACTURE Left 11/22/2019   Procedure: OPEN REDUCTION INTERNAL FIXATION (ORIF) DISTAL RADIAL FRACTURE;  Surgeon: Hessie Knows, MD;  Location: [facility];  Service: Orthopedics;  Laterality: Left;   ORIF ANKLE FRACTURE Right 10/11/2017  Procedure: I & D right ankle with application of external fixator and wound vac;  Surgeon: Hessie Knows, MD;  Location: [facility];  Service: Orthopedics;  Laterality: Right;    HEMATOLOGY/ONCOLOGY HISTORY:  Oncology History   No history exists.    ALLERGIES:  is allergic to codeine.  MEDICATIONS:  Current Outpatient Medications  Medication Sig Dispense Refill   albuterol (PROVENTIL HFA;VENTOLIN HFA) 108 (90 Base) MCG/ACT inhaler Inhale 2 puffs into the lungs every 6 (six) hours as needed for  wheezing or shortness of breath.     alprazolam (XANAX) 2 MG tablet Take 1 mg by mouth 2 (two) times daily as needed.      Budeson-Glycopyrrol-Formoterol (BREZTRI AEROSPHERE) 160-9-4.8 MCG/ACT AERO Inhale into the lungs.      Fluticasone-Salmeterol (ADVAIR) 250-50 MCG/DOSE AEPB Inhale 1 puff into the lungs 2 (two) times daily.     folic acid (FOLVITE) 202 MCG tablet Take 400 mcg by mouth daily.     furosemide (LASIX) 40 MG tablet Take 1 tablet (40 mg total) by mouth 2 (two) times daily. 60 tablet 5   Ipratropium-Albuterol (COMBIVENT) 20-100 MCG/ACT AERS respimat Inhale into the lungs. Inhale 2 inhalations into the lungs 4 (four) times daily as needed for Wheezing     Magnesium Oxide 400 MG CAPS Take 1 capsule (400 mg total) by mouth daily. 30 capsule 5   metoprolol succinate (TOPROL-XL) 25 MG 24 hr tablet Take 0.5 tablets (12.5 mg total) by mouth daily. 15 tablet 5   omeprazole (PRILOSEC) 20 MG capsule Take 20 mg by mouth daily.     oxyCODONE (OXY IR/ROXICODONE) 5 MG immediate release tablet Take 1 tablet (5 mg total) by mouth every 4 (four) hours as needed for moderate pain or severe pain. (Patient not taking: Reported on 05/05/2020) 30 tablet 0   PHENObarbital (LUMINAL) 100 MG tablet Take 100 mg by mouth at bedtime.      potassium chloride SA (KLOR-CON) 20 MEQ tablet Take 20 mEq by mouth 2 (two) times daily.     spironolactone (ALDACTONE) 25 MG tablet Take 2 tablets (50 mg total) by mouth daily. (Patient taking differently: Take 25 mg by mouth daily. ) 30 tablet 5   therapeutic multivitamin-minerals (THERAGRAN-M) tablet Take 1 tablet by mouth daily.     thiamine 100 MG tablet Take 100 mg by mouth daily.     No current facility-administered medications for this visit.    VITAL SIGNS: There were no vitals taken for this visit. There were no vitals filed for this visit.  Estimated body mass index is 30.42 kg/m as calculated from the following:   Height as of an earlier encounter  on 05/06/20: 5\' 9"  (1.753 m).   Weight as of an earlier encounter on 05/06/20: 206 lb (93.4 kg).  LABS: CBC:    Component Value Date/Time   WBC 6.2 05/05/2020 1214   HGB 14.6 05/05/2020 1214   HGB 15.9 01/11/2013 1205   HCT 39.8 05/05/2020 1214   HCT 44.8 01/11/2013 1205   PLT 63 (L) 05/05/2020 1214   PLT 59 (L) 01/11/2013 1205   MCV 102.3 (H) 05/05/2020 1214   MCV 96 01/11/2013 1205   NEUTROABS 4.2 05/05/2020 1214   LYMPHSABS 1.0 05/05/2020 1214   MONOABS 0.8 05/05/2020 1214   EOSABS 0.1 05/05/2020 1214   BASOSABS 0.1 05/05/2020 1214   Comprehensive Metabolic Panel:    Component Value Date/Time   NA 126 (L) 05/05/2020 1214   NA 135 (L) 01/11/2013 1205   K  2.9 (L) 05/05/2020 1214   K 3.6 01/11/2013 1205   CL 83 (L) 05/05/2020 1214   CL 104 01/11/2013 1205   CO2 29 05/05/2020 1214   CO2 26 01/11/2013 1205   BUN 8 05/05/2020 1214   BUN 12 01/11/2013 1205   CREATININE 0.70 05/05/2020 1214   CREATININE 1.04 01/11/2013 1205   GLUCOSE 98 05/05/2020 1214   GLUCOSE 169 (H) 01/11/2013 1205   CALCIUM 8.2 (L) 05/05/2020 1214   CALCIUM 7.5 (L) 10/15/2017 0614   AST 132 (H) 05/05/2020 1214   AST 206 (H) 10/21/2011 1423   ALT 54 (H) 05/05/2020 1214   ALT 157 (H) 10/21/2011 1423   ALKPHOS 214 (H) 05/05/2020 1214   ALKPHOS 156 (H) 10/21/2011 1423   BILITOT 7.7 (H) 05/05/2020 1214   BILITOT 1.2 (H) 10/21/2011 1423   PROT 7.0 05/05/2020 1214   PROT 8.6 (H) 10/21/2011 1423   ALBUMIN 2.6 (L) 05/05/2020 1214   ALBUMIN 3.5 10/21/2011 1423    RADIOGRAPHIC STUDIES: CT ABDOMEN PELVIS W WO CONTRAST  Addendum Date: 04/28/2020   ADDENDUM REPORT: 04/28/2020 10:58 ADDENDUM: These results will be called to the ordering clinician or representative by the Radiologist Assistant, and communication documented in the PACS or [employer]. Electronically Signed   By: Zetta Bills M.D.   On: 04/28/2020 10:58   Result Date: 04/28/2020 CLINICAL DATA:  Follow-up liver lesions EXAM: CT ABDOMEN AND  PELVIS WITHOUT AND WITH CONTRAST TECHNIQUE: Multidetector CT imaging of the abdomen and pelvis was performed following the standard protocol before and following the bolus administration of intravenous contrast. CONTRAST:  165mL OMNIPAQUE IOHEXOL 300 MG/ML  SOLN COMPARISON:  11/07/2019 FINDINGS: Lower chest: Basilar atelectasis. No dense consolidation. No pleural effusion. No pericardial fluid. Hepatobiliary: Signs of cirrhosis and portal hypertension with a similar appearance in terms of background liver disease. Observation 1: 6 x 5 cm area of arterial phase enhancement with mosaic enhancement and washout with ill-defined margins and numerous satellite lesions in hepatic subsegment IV. Is this becomes better defined on the most delayed portion of the exam and appears infiltrative on the enhancement pattern is due to the numerous areas of satellite lesions seen in the surrounding liver (image 33, series 15) better defined area measuring 5.3 x 5.3 cm LI-RADS category 5 Observation 2 (is hyperenhancing focus in the posterior RIGHT hepatic lobe measuring 2.6 x 1.7 cm without signs of washout LI-RADS category 4 but more suspicious based on other findings Observation 3: Area in hepatic subsegment III measuring 2.5 x 2.2 cm showing arterial phase enhancement and washout, LI-RADS category 5. Innumerable other areas of hyperenhancement and washout most less than a cm affecting both LEFT and RIGHT hepatic lobe concentrated more in the area of the dominant tumor and in the LEFT hepatic lobe, lateral segment. No gross portal venous extension but extension of less well-defined tumor towards the LEFT portal vein in the inter lobar fissure. Persistent pericholecystic stranding and edema in the setting of portal hypertension. Pancreas: Pancreas is normal without inflammation or ductal dilation. Spleen: Spleen upper limits of normal for size. Robust perisplenic varices and extensive splenorenal shunting with a very large gastric  varix arising from short gastric vessels and collaterals measuring 3.6 x 2.9 cm. Adrenals/Urinary Tract: LEFT adrenal is obscured by collateral pathways in the LEFT hemiabdomen but is grossly normal. RIGHT adrenal is normal. Symmetric renal enhancement. No sign of hydronephrosis. Urinary bladder is normal. No suspicious renal lesion. Stomach/Bowel: No acute gastrointestinal process. Large gastric varix as described.  Diffuse bowel edema in the setting of portal hypertension. Vascular/Lymphatic: Calcified atheromatous plaque in the abdominal aorta. Diminutive portal vein which remains patent but narrowed at the level of the splenic portal confluence tracking into the liver. Hepatic arterial supply derive from the celiac axis and classic fashion. No adenopathy in the retroperitoneum. No adenopathy in the pelvis. Reproductive: Prostate heterogeneous, nonspecific finding. Other: No ascites. Musculoskeletal: No acute musculoskeletal process. No destructive bone finding. IMPRESSION: 1. Multifocal hepatocellular carcinoma largest lesion in the central liver as described. All areas of the liver are involved. 2. No definite extension of tumor into larger portal branches but extension towards the LEFT portal vein in the fissure for false form ligament. Attention on follow-up. 3. Signs of portal hypertension with very large gastric varix greater than 3 cm. 4. Diminutive portal vein which remains patent but narrowed at the level of the splenic portal confluence tracking into the liver. Hepatic arterial supply derive from the celiac axis and classic fashion. 5. Aortic atherosclerosis. Aortic Atherosclerosis (ICD10-I70.0). Electronically Signed: By: Zetta Bills M.D. On: 04/28/2020 10:26    PERFORMANCE STATUS (ECOG) : 2 - Symptomatic, <50% confined to bed  Review of Systems Unless otherwise noted, a complete review of systems is negative.  Physical Exam General: NAD Cardiovascular: regular rate and rhythm Pulmonary:  clear ant fields Abdomen: soft, nontender, + bowel sounds GU: no suprapubic tenderness Extremities: no edema, no joint deformities Skin: Vitiligo Neurological: Weakness but otherwise nonfocal  IMPRESSION: Patient was accompanied by his sister to his clinic visit today.  He saw Dr. Tasia Catchings yesterday.  Possible option of immunotherapy was discussed.  However, labs suggest child Pugh class C cirrhosis, which would limit treatment options.  I had a conversation today with patient regarding his overall poor prognosis.  He seemed resistant to hearing that information.  He said his goal was to hopefully live another few years.  Unfortunately, his prognosis would likely be weeks to months.  I suggested hospice involvement but patient declined.  He said hospice was involved in his brother's care prior to his passing about a year ago.  I offered home-based palliative care but patient also declined.  Patient said that he was tired of coming to the hospital for doctor's appointments.  We discussed CODE STATUS but patient was undecided.  I sent him home with a MOST form and ACP documents and he agreed to talk more with his family about decision-making.  Symptomatically, patient reports progressive fatigue with occasional exertional dyspnea.  He denies pain or distressing symptoms at present.  He says he is still able to manage his own care at home but his sister tells me that she is having to increasingly help.  PLAN: -Best supportive care -Recommend hospice -ACP/MOST form reviewed -Follow up MyChart visit in 304   Patient expressed understanding and was in agreement with this plan. He also understands that He can call the clinic at any time with any questions, concerns, or complaints.     Time Total: 25 minutes  Visit consisted of counseling and education dealing with the complex and emotionally intense issues of symptom management and palliative care in the setting of serious and potentially  life-threatening illness.Greater than 50%  of this time was spent counseling and coordinating care related to the above assessment and plan.  Signed by: Altha Harm, PhD, NP-C

## 2020-05-21 ENCOUNTER — Telehealth (HOSPITAL_BASED_OUTPATIENT_CLINIC_OR_DEPARTMENT_OTHER): Payer: Medicare Other | Admitting: Hospice and Palliative Medicine

## 2020-05-21 ENCOUNTER — Telehealth: Payer: Self-pay | Admitting: *Deleted

## 2020-05-21 DIAGNOSIS — C220 Liver cell carcinoma: Secondary | ICD-10-CM | POA: Diagnosis not present

## 2020-05-21 DIAGNOSIS — Z7189 Other specified counseling: Secondary | ICD-10-CM | POA: Diagnosis not present

## 2020-05-21 DIAGNOSIS — Z515 Encounter for palliative care: Secondary | ICD-10-CM

## 2020-05-21 NOTE — Telephone Encounter (Signed)
Glen Colon called asking to speak with Glen Dove, NP regarding a referral to hospice

## 2020-05-21 NOTE — Telephone Encounter (Signed)
Spoke with daughter/patient and sent a referral for hospice.

## 2020-05-21 NOTE — Progress Notes (Signed)
Virtual Visit via Telephone Note  I connected with Glen Colon on 05/21/20 at  1:00 PM EDT by telephone and verified that I am speaking with the correct person using two identifiers.   I discussed the limitations, risks, security and privacy concerns of performing an evaluation and management service by telephone and the availability of in person appointments. I also discussed with the patient that there may be a patient responsible charge related to this service. The patient expressed understanding and agreed to proceed.   History of Present Illness: Glen Colon is a 69 y.o. 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  He has been seen by medical oncology with Dr. Tasia Catchings. Patient is not felt to be a candidate for systemic treatment given his advanced cirrhosis.  Prognosis is poor.  He was referred to palliative care to help address goals and manage ongoing symptoms.   Observations/Objective: I spoke with patient and his sister by phone.  Both report decline over the past couple weeks since he was last seen in the clinic.  He has become increasingly weak with poor oral intake.  We again discussed general goals.  Patient now says that he would be in agreement with hospice care in the home.  Discussed with Dr. Tasia Catchings.  We will pursue hospice referral.  I discussed CODE STATUS.  Patient says that he would not want to be resuscitated or have his life prolonged artificially on machines.  He is in agreement with DNR/DNI.  I will mail him a DNR order to his house.  Assessment and Plan: Hepatocellular carcinoma -patient will follow up for hospice ([facility]).  Plan best supportive care at home.  DNR/DNI.  DNR order was mailed to his home.  Follow Up Instructions: As needed   I discussed the assessment and treatment plan with the patient. The patient was provided  an opportunity to ask questions and all were answered. The patient agreed with the plan and demonstrated an understanding of the instructions.   The patient was advised to call back or seek an in-person evaluation if the symptoms worsen or if the condition fails to improve as anticipated.  I provided 15 minutes of non-face-to-face time during this encounter.   Irean Hong, NP

## 2020-05-22 ENCOUNTER — Other Ambulatory Visit: Payer: Self-pay | Admitting: Gastroenterology

## 2020-05-22 DIAGNOSIS — K7031 Alcoholic cirrhosis of liver with ascites: Secondary | ICD-10-CM

## 2020-05-23 ENCOUNTER — Other Ambulatory Visit: Payer: Self-pay

## 2020-05-23 ENCOUNTER — Ambulatory Visit
Admission: RE | Admit: 2020-05-23 | Discharge: 2020-05-23 | Disposition: A | Discharge status: Home / Self Care | Payer: Medicare Other | Source: Ambulatory Visit | Attending: Gastroenterology | Admitting: Gastroenterology

## 2020-05-23 DIAGNOSIS — K7031 Alcoholic cirrhosis of liver with ascites: Secondary | ICD-10-CM | POA: Diagnosis present

## 2020-05-26 ENCOUNTER — Other Ambulatory Visit: Payer: Self-pay | Admitting: Gastroenterology

## 2020-05-27 ENCOUNTER — Inpatient Hospital Stay: Payer: Medicare Other | Admitting: Hospice and Palliative Medicine

## 2020-05-27 ENCOUNTER — Ambulatory Visit
Admission: RE | Admit: 2020-05-27 | Discharge: 2020-05-27 | Disposition: A | Discharge status: Home / Self Care | Payer: Medicare Other | Source: Ambulatory Visit | Attending: Gastroenterology | Admitting: Gastroenterology

## 2020-05-27 ENCOUNTER — Other Ambulatory Visit: Payer: Self-pay | Admitting: Gastroenterology

## 2020-05-27 ENCOUNTER — Other Ambulatory Visit: Payer: Self-pay

## 2020-05-27 DIAGNOSIS — K7031 Alcoholic cirrhosis of liver with ascites: Secondary | ICD-10-CM

## 2020-05-27 DIAGNOSIS — C220 Liver cell carcinoma: Secondary | ICD-10-CM | POA: Diagnosis present

## 2020-05-27 LAB — BODY FLUID CELL COUNT WITH DIFFERENTIAL
Eos, Fluid: 0 %
Lymphs, Fluid: 32 %
Monocyte-Macrophage-Serous Fluid: 38 %
Neutrophil Count, Fluid: 30 %
Total Nucleated Cell Count, Fluid: 427 cu mm

## 2020-05-27 LAB — ALBUMIN, PLEURAL OR PERITONEAL FLUID: Albumin, Fluid: <1 g/dL

## 2020-05-27 LAB — PROTEIN, PLEURAL OR PERITONEAL FLUID: Total protein, fluid: <3 g/dL

## 2020-05-27 NOTE — Procedures (Signed)
Interventional Radiology Procedure:   Indications: Cirrhosis with ascites  Procedure: US guided paracentesis  Findings: Removed 1400 ml from right lower quadrant  Complications: None     EBL: Less than 10 ml   Judie Hollick R. Anselm Pancoast, MD  Pager: [phone]

## 2020-05-28 ENCOUNTER — Other Ambulatory Visit: Payer: Self-pay | Admitting: Hospice and Palliative Medicine

## 2020-05-28 MED ORDER — MORPHINE SULFATE (CONCENTRATE) 10 MG /0.5 ML PO SOLN: 5.0000 mg | ORAL | 0 refills | Status: AC | PRN

## 2020-05-28 NOTE — Progress Notes (Signed)
I spoke with patient's hospice nurse. He is having more dyspnea and an order was requested for morphine elixir. Okay to start morphine elixir 5-10mg  Q2H PRN for pain/dyspena. Continue alprazolam PRN for anxiety.

## 2020-05-29 LAB — PROTEIN, BODY FLUID (OTHER): Total Protein, Body Fluid Other: 0.9 g/dL

## 2020-05-29 LAB — CYTOLOGY - NON PAP

## 2020-05-31 LAB — BODY FLUID CULTURE: Culture: NO GROWTH

## 2020-06-03 ENCOUNTER — Telehealth: Payer: Self-pay

## 2020-06-03 NOTE — Telephone Encounter (Signed)
Death certification completed and signed by MD. [employer] [employer] to notify them that certificate is ready to be picked up. Left certificate downstairs in designated book for pick up and copy was sent to scan in chart. Copy also faxed to funeral home.

## 2020-06-27 DEATH — deceased
